# Patient Record
Sex: Female | Born: 1935 | Race: White | Hispanic: No | Smoking: Former smoker
Health system: Southern US, Community
[De-identification: ages and names within clinical notes are randomized; demographics above are authoritative.]

## PROBLEM LIST (undated history)

## (undated) DIAGNOSIS — E785 Hyperlipidemia, unspecified: Secondary | ICD-10-CM

## (undated) DIAGNOSIS — G4700 Insomnia, unspecified: Secondary | ICD-10-CM

## (undated) DIAGNOSIS — I10 Essential (primary) hypertension: Secondary | ICD-10-CM

## (undated) DIAGNOSIS — I509 Heart failure, unspecified: Secondary | ICD-10-CM

## (undated) DIAGNOSIS — G250 Essential tremor: Secondary | ICD-10-CM

## (undated) DIAGNOSIS — F0150 Vascular dementia without behavioral disturbance: Secondary | ICD-10-CM

## (undated) DIAGNOSIS — D649 Anemia, unspecified: Secondary | ICD-10-CM

## (undated) DIAGNOSIS — E079 Disorder of thyroid, unspecified: Secondary | ICD-10-CM

## (undated) DIAGNOSIS — IMO0001 Reserved for inherently not codable concepts without codable children: Secondary | ICD-10-CM

## (undated) DIAGNOSIS — F329 Major depressive disorder, single episode, unspecified: Secondary | ICD-10-CM

## (undated) DIAGNOSIS — F209 Schizophrenia, unspecified: Secondary | ICD-10-CM

## (undated) DIAGNOSIS — F32A Depression, unspecified: Secondary | ICD-10-CM

## (undated) DIAGNOSIS — G252 Other specified forms of tremor: Secondary | ICD-10-CM

## (undated) HISTORY — DX: Anemia, unspecified: D64.9

## (undated) HISTORY — DX: Essential tremor: G25.0

## (undated) HISTORY — DX: Other specified forms of tremor: G25.2

## (undated) HISTORY — DX: Essential (primary) hypertension: I10

## (undated) HISTORY — DX: Insomnia, unspecified: G47.00

## (undated) HISTORY — DX: Hyperlipidemia, unspecified: E78.5

## (undated) HISTORY — DX: Depression, unspecified: F32.A

## (undated) HISTORY — DX: Heart failure, unspecified: I50.9

## (undated) HISTORY — DX: Major depressive disorder, single episode, unspecified: F32.9

## (undated) HISTORY — DX: Schizophrenia, unspecified: F20.9

## (undated) HISTORY — DX: Disorder of thyroid, unspecified: E07.9

## (undated) HISTORY — DX: Reserved for inherently not codable concepts without codable children: IMO0001

---

## 1998-06-15 ENCOUNTER — Inpatient Hospital Stay (HOSPITAL_COMMUNITY): Admission: EM | Admit: 1998-06-15 | Discharge: 1998-07-05 | Payer: Self-pay | Admitting: Emergency Medicine

## 2000-04-27 ENCOUNTER — Encounter (INDEPENDENT_AMBULATORY_CARE_PROVIDER_SITE_OTHER): Payer: Self-pay | Admitting: *Deleted

## 2000-04-27 ENCOUNTER — Ambulatory Visit (HOSPITAL_BASED_OUTPATIENT_CLINIC_OR_DEPARTMENT_OTHER): Admission: RE | Admit: 2000-04-27 | Discharge: 2000-04-27 | Payer: Self-pay | Admitting: General Surgery

## 2000-05-25 ENCOUNTER — Encounter: Payer: Self-pay | Admitting: *Deleted

## 2000-05-25 ENCOUNTER — Ambulatory Visit (HOSPITAL_COMMUNITY): Admission: RE | Admit: 2000-05-25 | Discharge: 2000-05-25 | Payer: Self-pay | Admitting: *Deleted

## 2000-06-29 ENCOUNTER — Other Ambulatory Visit: Admission: RE | Admit: 2000-06-29 | Discharge: 2000-06-29 | Payer: Self-pay | Admitting: *Deleted

## 2001-06-02 ENCOUNTER — Encounter: Payer: Self-pay | Admitting: *Deleted

## 2001-06-02 ENCOUNTER — Inpatient Hospital Stay (HOSPITAL_COMMUNITY): Admission: EM | Admit: 2001-06-02 | Discharge: 2001-06-06 | Payer: Self-pay | Admitting: *Deleted

## 2001-06-04 ENCOUNTER — Encounter: Payer: Self-pay | Admitting: Internal Medicine

## 2003-01-29 ENCOUNTER — Encounter: Admission: RE | Admit: 2003-01-29 | Discharge: 2003-01-29 | Payer: Self-pay | Admitting: Gastroenterology

## 2003-04-04 ENCOUNTER — Ambulatory Visit: Admission: RE | Admit: 2003-04-04 | Discharge: 2003-04-04 | Payer: Self-pay | Admitting: Gastroenterology

## 2003-05-02 ENCOUNTER — Ambulatory Visit (HOSPITAL_COMMUNITY): Admission: RE | Admit: 2003-05-02 | Discharge: 2003-05-02 | Payer: Self-pay | Admitting: Internal Medicine

## 2003-06-15 ENCOUNTER — Encounter: Admission: RE | Admit: 2003-06-15 | Discharge: 2003-06-15 | Payer: Self-pay | Admitting: Gastroenterology

## 2004-07-22 ENCOUNTER — Ambulatory Visit (HOSPITAL_COMMUNITY): Admission: RE | Admit: 2004-07-22 | Discharge: 2004-07-22 | Payer: Self-pay | Admitting: Internal Medicine

## 2004-07-30 ENCOUNTER — Ambulatory Visit: Payer: Self-pay | Admitting: Hematology & Oncology

## 2004-08-11 ENCOUNTER — Ambulatory Visit: Payer: Self-pay | Admitting: Hematology & Oncology

## 2005-03-24 ENCOUNTER — Ambulatory Visit (HOSPITAL_COMMUNITY): Admission: RE | Admit: 2005-03-24 | Discharge: 2005-03-24 | Payer: Self-pay | Admitting: Internal Medicine

## 2005-04-19 ENCOUNTER — Encounter: Admission: RE | Admit: 2005-04-19 | Discharge: 2005-04-19 | Payer: Self-pay | Admitting: Gastroenterology

## 2005-08-18 ENCOUNTER — Encounter: Admission: RE | Admit: 2005-08-18 | Discharge: 2005-08-18 | Payer: Self-pay | Admitting: Internal Medicine

## 2005-10-24 ENCOUNTER — Inpatient Hospital Stay (HOSPITAL_COMMUNITY): Admission: EM | Admit: 2005-10-24 | Discharge: 2005-10-29 | Payer: Self-pay | Admitting: Emergency Medicine

## 2006-02-21 ENCOUNTER — Emergency Department (HOSPITAL_COMMUNITY): Admission: EM | Admit: 2006-02-21 | Discharge: 2006-02-21 | Payer: Self-pay | Admitting: Family Medicine

## 2006-03-12 ENCOUNTER — Emergency Department (HOSPITAL_COMMUNITY): Admission: EM | Admit: 2006-03-12 | Discharge: 2006-03-12 | Payer: Self-pay | Admitting: Emergency Medicine

## 2006-03-19 ENCOUNTER — Ambulatory Visit: Payer: Self-pay | Admitting: Family Medicine

## 2006-03-19 ENCOUNTER — Encounter (INDEPENDENT_AMBULATORY_CARE_PROVIDER_SITE_OTHER): Payer: Self-pay | Admitting: Family Medicine

## 2006-03-19 LAB — CONVERTED CEMR LAB
AST: 26 units/L (ref 0–37)
Albumin: 4.1 g/dL (ref 3.5–5.2)
BUN: 12 mg/dL (ref 6–23)
CO2: 27 meq/L (ref 19–32)
Creatinine, Ser: 1.08 mg/dL (ref 0.40–1.20)
Glucose, Bld: 88 mg/dL (ref 70–99)
HDL: 60 mg/dL (ref >39)
Potassium: 3.5 meq/L (ref 3.5–5.3)
Sodium: 140 meq/L (ref 135–145)
Total Bilirubin: 0.9 mg/dL (ref 0.3–1.2)
Total Protein: 6.9 g/dL (ref 6.0–8.3)
VLDL: 39 mg/dL (ref 0–40)

## 2006-04-14 ENCOUNTER — Emergency Department (HOSPITAL_COMMUNITY): Admission: EM | Admit: 2006-04-14 | Discharge: 2006-04-14 | Payer: Self-pay | Admitting: *Deleted

## 2006-04-29 DIAGNOSIS — E039 Hypothyroidism, unspecified: Secondary | ICD-10-CM | POA: Insufficient documentation

## 2006-04-29 DIAGNOSIS — F209 Schizophrenia, unspecified: Secondary | ICD-10-CM

## 2006-04-29 DIAGNOSIS — K5900 Constipation, unspecified: Secondary | ICD-10-CM | POA: Insufficient documentation

## 2006-04-29 HISTORY — DX: Schizophrenia, unspecified: F20.9

## 2006-05-28 ENCOUNTER — Ambulatory Visit: Payer: Self-pay | Admitting: Sports Medicine

## 2006-05-28 ENCOUNTER — Encounter (INDEPENDENT_AMBULATORY_CARE_PROVIDER_SITE_OTHER): Payer: Self-pay | Admitting: Family Medicine

## 2006-05-28 DIAGNOSIS — I10 Essential (primary) hypertension: Secondary | ICD-10-CM

## 2006-05-28 DIAGNOSIS — G250 Essential tremor: Secondary | ICD-10-CM | POA: Insufficient documentation

## 2006-05-28 DIAGNOSIS — G252 Other specified forms of tremor: Secondary | ICD-10-CM

## 2006-05-28 DIAGNOSIS — E785 Hyperlipidemia, unspecified: Secondary | ICD-10-CM | POA: Insufficient documentation

## 2006-05-28 HISTORY — DX: Essential tremor: G25.2

## 2006-05-28 HISTORY — DX: Essential tremor: G25.0

## 2006-07-09 ENCOUNTER — Encounter (INDEPENDENT_AMBULATORY_CARE_PROVIDER_SITE_OTHER): Payer: Self-pay | Admitting: Family Medicine

## 2006-07-09 ENCOUNTER — Ambulatory Visit: Payer: Self-pay | Admitting: Family Medicine

## 2006-07-28 LAB — CONVERTED CEMR LAB
ALT: 12 units/L (ref 0–35)
Albumin: 3.9 g/dL (ref 3.5–5.2)
Alkaline Phosphatase: 115 units/L (ref 39–117)
CO2: 30 meq/L (ref 19–32)
Calcium: 9.3 mg/dL (ref 8.4–10.5)
Chloride: 108 meq/L (ref 96–112)
Glucose, Bld: 97 mg/dL (ref 70–99)
Potassium: 4.5 meq/L (ref 3.5–5.3)
Sodium: 144 meq/L (ref 135–145)
TSH: 0.06 microintl units/mL — ABNORMAL LOW (ref 0.350–5.50)
Total Protein: 6.1 g/dL (ref 6.0–8.3)

## 2006-08-18 ENCOUNTER — Encounter (INDEPENDENT_AMBULATORY_CARE_PROVIDER_SITE_OTHER): Payer: Self-pay | Admitting: Family Medicine

## 2006-08-18 ENCOUNTER — Ambulatory Visit: Payer: Self-pay | Admitting: Family Medicine

## 2006-09-15 ENCOUNTER — Ambulatory Visit: Payer: Self-pay | Admitting: Family Medicine

## 2006-09-23 ENCOUNTER — Ambulatory Visit: Payer: Self-pay | Admitting: Family Medicine

## 2006-09-23 ENCOUNTER — Encounter: Admission: RE | Admit: 2006-09-23 | Discharge: 2006-09-23 | Payer: Self-pay | Admitting: Sports Medicine

## 2006-09-27 ENCOUNTER — Encounter: Payer: Self-pay | Admitting: Family Medicine

## 2006-11-17 ENCOUNTER — Telehealth (INDEPENDENT_AMBULATORY_CARE_PROVIDER_SITE_OTHER): Payer: Self-pay | Admitting: *Deleted

## 2006-11-17 ENCOUNTER — Ambulatory Visit: Payer: Self-pay | Admitting: Family Medicine

## 2006-11-17 DIAGNOSIS — R609 Edema, unspecified: Secondary | ICD-10-CM

## 2007-01-18 ENCOUNTER — Telehealth: Payer: Self-pay | Admitting: Family Medicine

## 2007-01-18 ENCOUNTER — Ambulatory Visit: Payer: Self-pay | Admitting: Family Medicine

## 2007-01-18 ENCOUNTER — Encounter: Payer: Self-pay | Admitting: Family Medicine

## 2007-01-20 ENCOUNTER — Encounter: Payer: Self-pay | Admitting: Family Medicine

## 2007-01-20 LAB — CONVERTED CEMR LAB
ALT: 8 units/L
AST: 22 units/L
Alkaline Phosphatase: 122 units/L
Direct LDL: 75 mg/dL
TSH: 69.559 microintl units/mL

## 2007-01-21 LAB — CONVERTED CEMR LAB
Albumin: 3.8 g/dL (ref 3.5–5.2)
Direct LDL: 75 mg/dL
Potassium: 3.7 meq/L (ref 3.5–5.3)
TSH: 69.559 microintl units/mL — ABNORMAL HIGH (ref 0.350–5.50)
Total Bilirubin: 0.8 mg/dL (ref 0.3–1.2)
Total Protein: 6.4 g/dL (ref 6.0–8.3)

## 2007-01-24 ENCOUNTER — Telehealth: Payer: Self-pay | Admitting: Family Medicine

## 2007-02-17 ENCOUNTER — Ambulatory Visit: Payer: Self-pay | Admitting: Family Medicine

## 2007-02-17 ENCOUNTER — Encounter: Payer: Self-pay | Admitting: Family Medicine

## 2007-02-17 DIAGNOSIS — R5381 Other malaise: Secondary | ICD-10-CM

## 2007-02-17 DIAGNOSIS — R5383 Other fatigue: Secondary | ICD-10-CM

## 2007-02-17 LAB — CONVERTED CEMR LAB
BUN: 25 mg/dL — ABNORMAL HIGH (ref 6–23)
Calcium: 9.8 mg/dL (ref 8.4–10.5)
Glucose, Bld: 90 mg/dL (ref 70–99)
HCT: 36.7 % (ref 36.0–46.0)
MCHC: 33.8 g/dL (ref 30.0–36.0)
Platelets: 162 10*3/uL (ref 150–400)
Potassium: 3.9 meq/L (ref 3.5–5.3)
RDW: 13.6 % (ref 11.5–15.5)
Sodium: 141 meq/L (ref 135–145)
TSH: 14.444 microintl units/mL — ABNORMAL HIGH (ref 0.350–5.50)
Total CK: 27 units/L (ref 7–177)

## 2007-02-18 ENCOUNTER — Telehealth: Payer: Self-pay | Admitting: Family Medicine

## 2007-02-28 ENCOUNTER — Telehealth: Payer: Self-pay | Admitting: Family Medicine

## 2007-03-07 ENCOUNTER — Telehealth: Payer: Self-pay | Admitting: *Deleted

## 2007-03-08 ENCOUNTER — Emergency Department (HOSPITAL_COMMUNITY): Admission: EM | Admit: 2007-03-08 | Discharge: 2007-03-09 | Payer: Self-pay | Admitting: Emergency Medicine

## 2007-03-09 ENCOUNTER — Telehealth: Payer: Self-pay | Admitting: *Deleted

## 2007-03-10 ENCOUNTER — Ambulatory Visit: Payer: Self-pay | Admitting: Family Medicine

## 2007-04-07 ENCOUNTER — Telehealth: Payer: Self-pay | Admitting: Family Medicine

## 2007-04-21 ENCOUNTER — Encounter: Payer: Self-pay | Admitting: Family Medicine

## 2007-04-21 DIAGNOSIS — Z789 Other specified health status: Secondary | ICD-10-CM | POA: Insufficient documentation

## 2007-07-12 ENCOUNTER — Telehealth: Payer: Self-pay | Admitting: *Deleted

## 2007-07-13 ENCOUNTER — Encounter (INDEPENDENT_AMBULATORY_CARE_PROVIDER_SITE_OTHER): Payer: Self-pay | Admitting: Family Medicine

## 2007-07-13 ENCOUNTER — Ambulatory Visit: Payer: Self-pay | Admitting: Family Medicine

## 2007-07-13 DIAGNOSIS — IMO0001 Reserved for inherently not codable concepts without codable children: Secondary | ICD-10-CM

## 2007-07-13 HISTORY — DX: Reserved for inherently not codable concepts without codable children: IMO0001

## 2007-07-13 LAB — CONVERTED CEMR LAB
ALT: 10 units/L (ref 0–35)
AST: 20 units/L (ref 0–37)
Albumin: 4.1 g/dL (ref 3.5–5.2)
Alkaline Phosphatase: 118 units/L — ABNORMAL HIGH (ref 39–117)
BUN: 20 mg/dL (ref 6–23)
Basophils Absolute: 0 10*3/uL (ref 0.0–0.1)
Basophils Relative: 1 % (ref 0–1)
Calcium: 9.6 mg/dL (ref 8.4–10.5)
Chloride: 102 meq/L (ref 96–112)
Hemoglobin: 12.9 g/dL (ref 12.0–15.0)
Lymphocytes Relative: 17 % (ref 12–46)
MCHC: 33.2 g/dL (ref 30.0–36.0)
Monocytes Absolute: 0.4 10*3/uL (ref 0.1–1.0)
Neutro Abs: 4.4 10*3/uL (ref 1.7–7.7)
Neutrophils Relative %: 71 % (ref 43–77)
Platelets: 186 10*3/uL (ref 150–400)
Potassium: 4.1 meq/L (ref 3.5–5.3)
RDW: 13.1 % (ref 11.5–15.5)
Sed Rate: 36 mm/hr — ABNORMAL HIGH (ref 0–22)
Sodium: 140 meq/L (ref 135–145)
TSH: 3.525 microintl units/mL (ref 0.350–5.50)
Total Protein: 6.6 g/dL (ref 6.0–8.3)

## 2007-07-15 ENCOUNTER — Encounter (INDEPENDENT_AMBULATORY_CARE_PROVIDER_SITE_OTHER): Payer: Self-pay | Admitting: Family Medicine

## 2007-07-18 ENCOUNTER — Encounter (INDEPENDENT_AMBULATORY_CARE_PROVIDER_SITE_OTHER): Payer: Self-pay | Admitting: Family Medicine

## 2007-07-28 ENCOUNTER — Ambulatory Visit: Payer: Self-pay | Admitting: Family Medicine

## 2007-07-28 ENCOUNTER — Telehealth (INDEPENDENT_AMBULATORY_CARE_PROVIDER_SITE_OTHER): Payer: Self-pay | Admitting: *Deleted

## 2007-07-28 DIAGNOSIS — R413 Other amnesia: Secondary | ICD-10-CM | POA: Insufficient documentation

## 2007-07-29 ENCOUNTER — Encounter: Payer: Self-pay | Admitting: Family Medicine

## 2007-07-29 ENCOUNTER — Telehealth (INDEPENDENT_AMBULATORY_CARE_PROVIDER_SITE_OTHER): Payer: Self-pay | Admitting: *Deleted

## 2007-08-19 ENCOUNTER — Telehealth: Payer: Self-pay | Admitting: Family Medicine

## 2007-11-01 ENCOUNTER — Emergency Department (HOSPITAL_COMMUNITY): Admission: EM | Admit: 2007-11-01 | Discharge: 2007-11-01 | Payer: Self-pay | Admitting: Emergency Medicine

## 2007-11-15 ENCOUNTER — Telehealth: Payer: Self-pay | Admitting: *Deleted

## 2007-11-17 ENCOUNTER — Ambulatory Visit: Payer: Self-pay | Admitting: Family Medicine

## 2007-11-17 ENCOUNTER — Encounter: Payer: Self-pay | Admitting: Family Medicine

## 2007-11-17 LAB — CONVERTED CEMR LAB
BUN: 19 mg/dL (ref 6–23)
Potassium: 4.2 meq/L (ref 3.5–5.3)
Sodium: 143 meq/L (ref 135–145)

## 2008-01-11 ENCOUNTER — Ambulatory Visit: Payer: Self-pay | Admitting: Family Medicine

## 2008-03-29 IMAGING — CR DG RIBS W/ CHEST 3+V*L*
5 series · 5 of 5 positions shown · non-contrast
Comparison: none

CLINICAL DATA: Rib pain after a fall. 
 LEFT RIBS WITH CHEST:
 A frontal chest x-ray and three left rib detail films were obtained.  On chest x-ray, the lungs are clear.  No pneumothorax is seen.  There are degenerative changes noted in the shoulders, left greater than right.  Heart size is within normal limits.  No definite acute rib fracture is seen.

[view not recorded (1 of 5)]
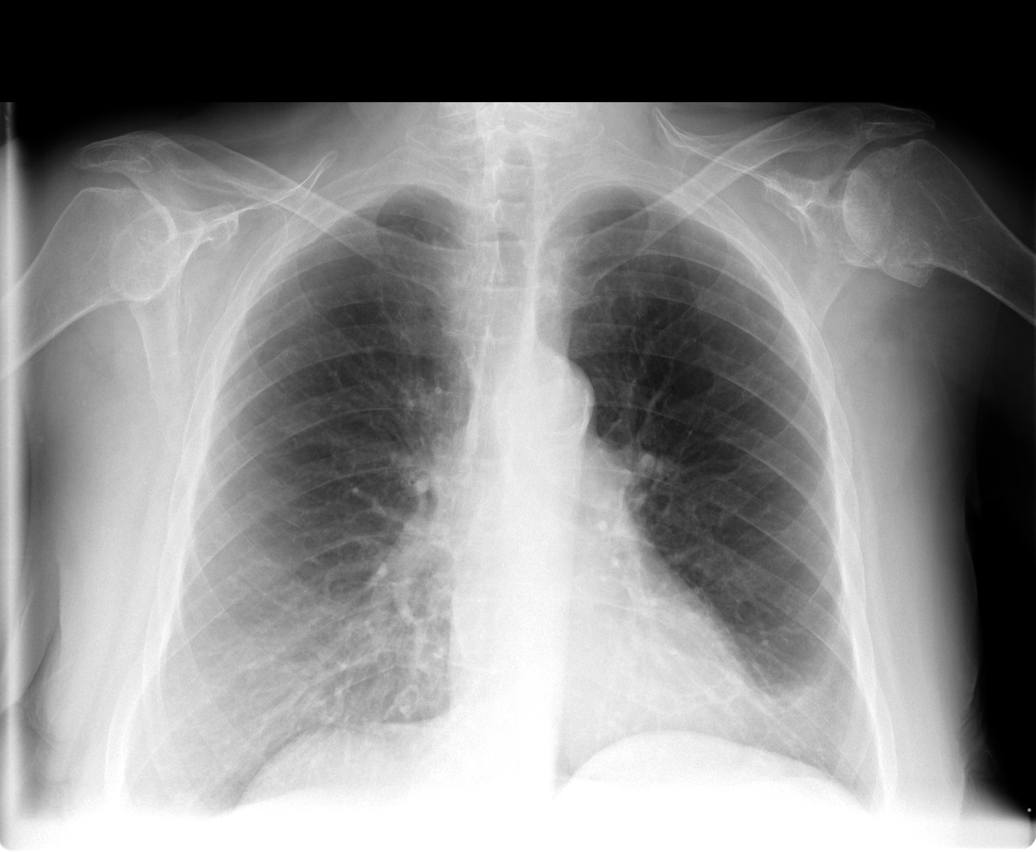

[view not recorded (2 of 5)]
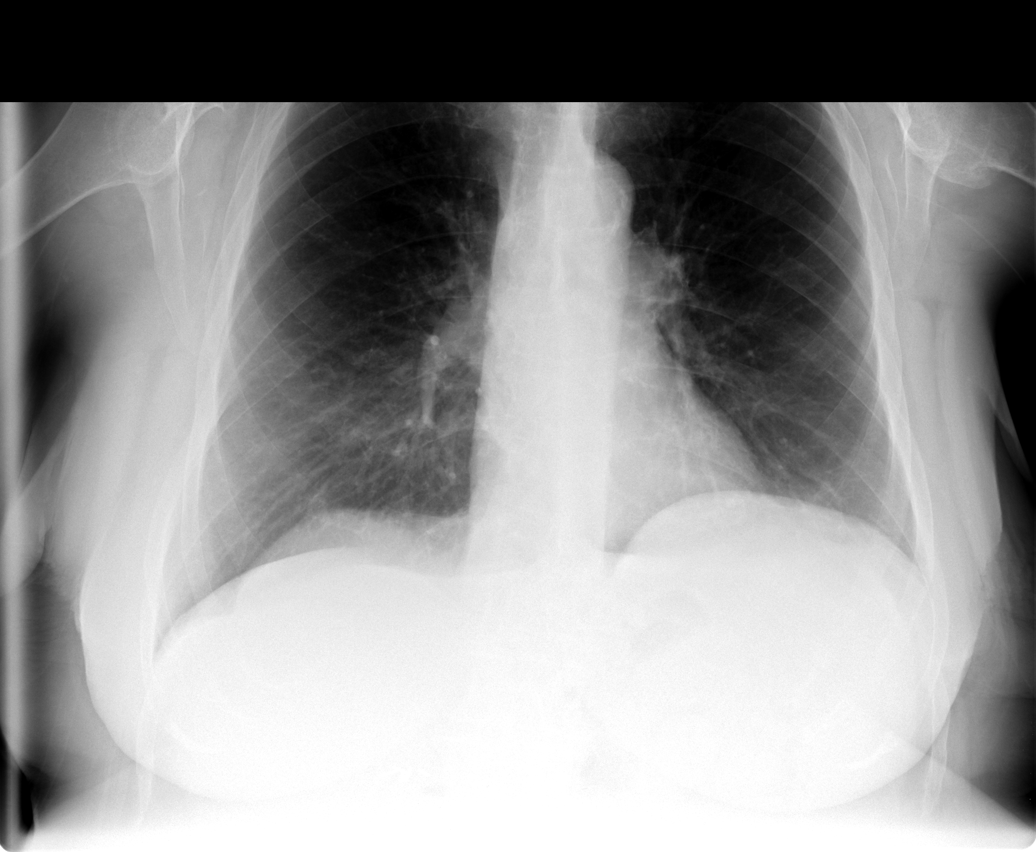

[view not recorded (3 of 5)]
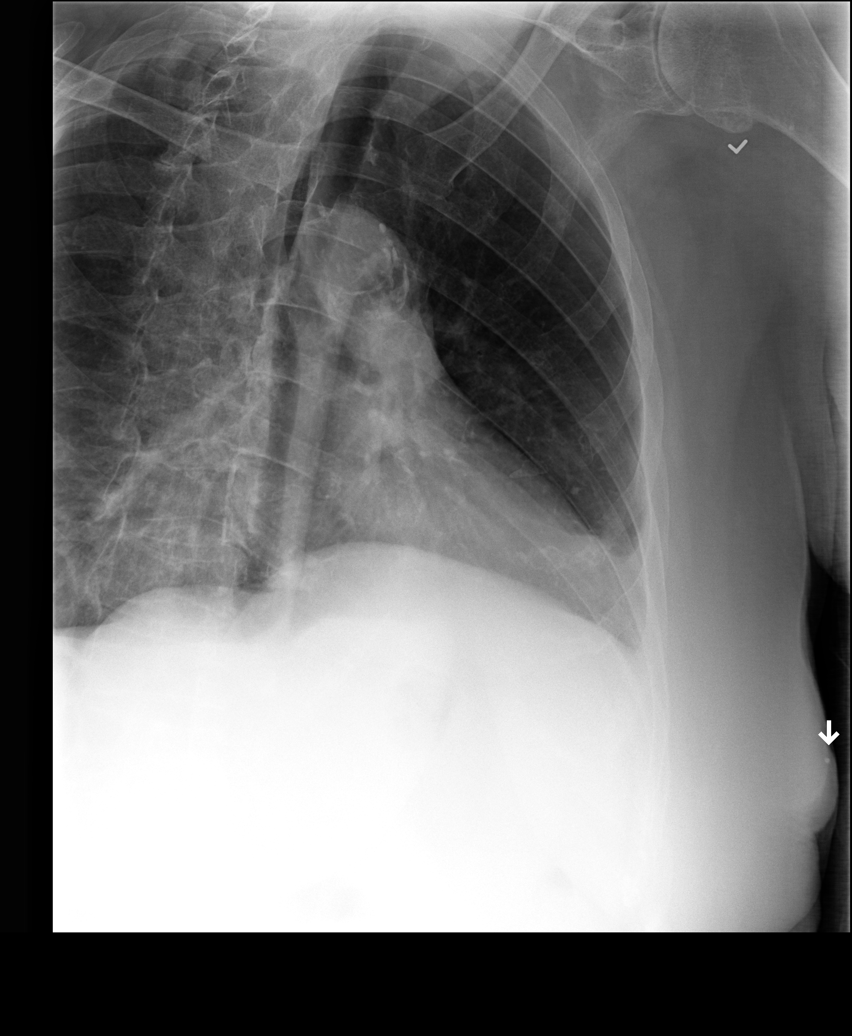

[view not recorded (4 of 5)]
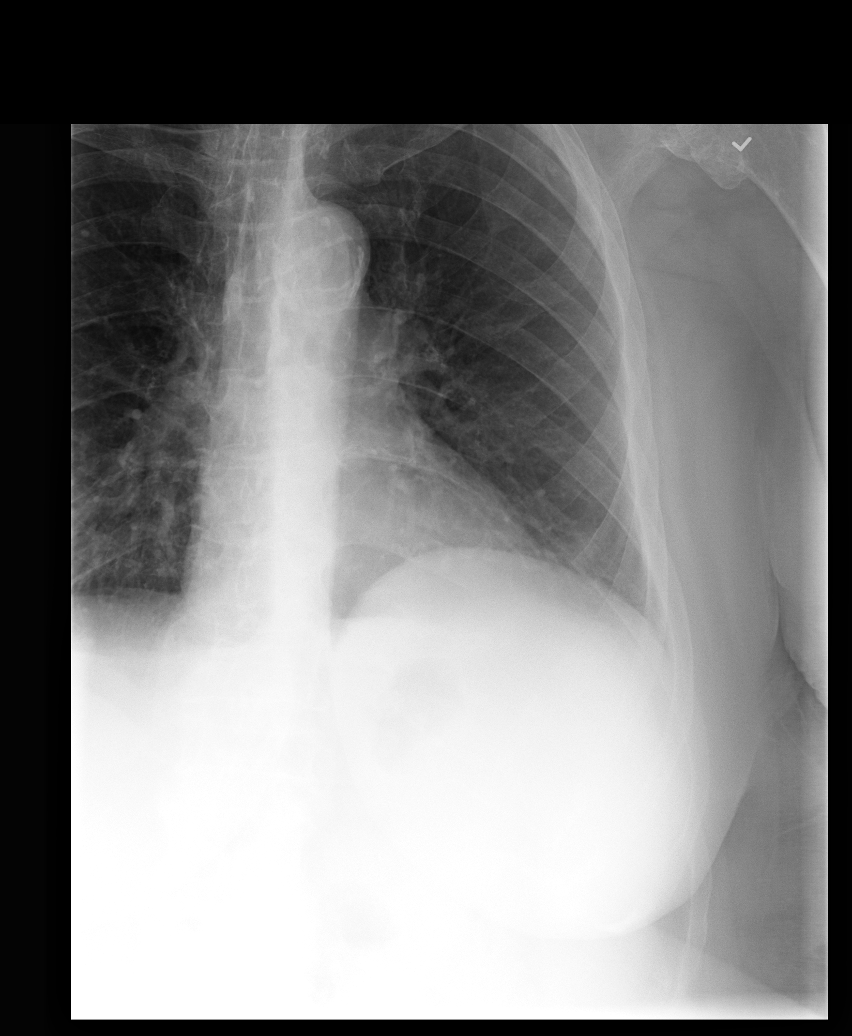

[view not recorded (5 of 5)]
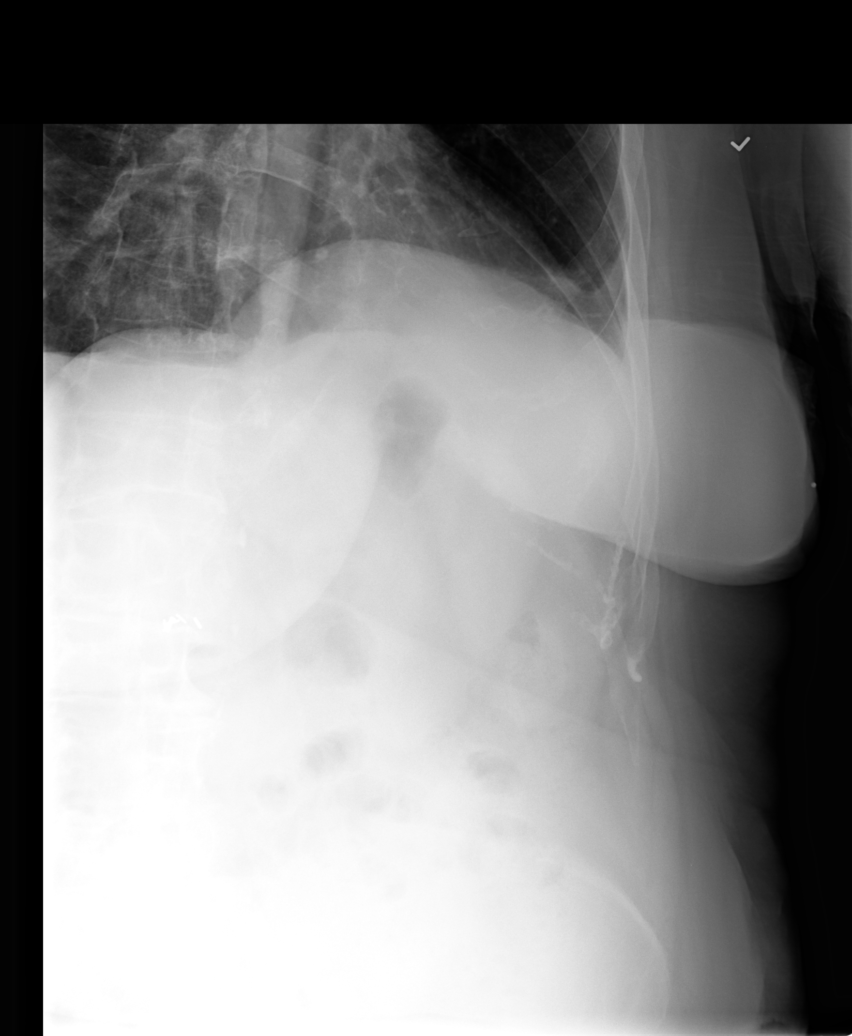

[5 of 5 positions shown; findings below may reference images not displayed]

IMPRESSION: 1.  No active lung disease. 
 2.  No definite left rib fracture is noted.

## 2008-05-18 ENCOUNTER — Telehealth: Payer: Self-pay | Admitting: *Deleted

## 2008-05-22 ENCOUNTER — Encounter: Payer: Self-pay | Admitting: Family Medicine

## 2008-05-22 ENCOUNTER — Telehealth: Payer: Self-pay | Admitting: Family Medicine

## 2008-05-22 ENCOUNTER — Ambulatory Visit: Payer: Self-pay | Admitting: Family Medicine

## 2008-05-22 DIAGNOSIS — R1110 Vomiting, unspecified: Secondary | ICD-10-CM | POA: Insufficient documentation

## 2008-05-22 LAB — CONVERTED CEMR LAB
Bilirubin Urine: NEGATIVE
Blood in Urine, dipstick: NEGATIVE
Glucose, Urine, Semiquant: NEGATIVE
Protein, U semiquant: NEGATIVE
Specific Gravity, Urine: 1.01
pH: 6

## 2008-05-24 ENCOUNTER — Encounter: Payer: Self-pay | Admitting: Family Medicine

## 2008-05-24 LAB — CONVERTED CEMR LAB
ALT: 10 units/L (ref 0–35)
AST: 26 units/L (ref 0–37)
CO2: 30 meq/L (ref 19–32)
Cholesterol: 187 mg/dL (ref 0–200)
LDL Cholesterol: 104 mg/dL — ABNORMAL HIGH (ref 0–99)
Sodium: 140 meq/L (ref 135–145)
TSH: 44.918 microintl units/mL — ABNORMAL HIGH (ref 0.350–4.500)
Total Bilirubin: 1.1 mg/dL (ref 0.3–1.2)
Total CK: 24 units/L (ref 7–177)
Total Protein: 6.5 g/dL (ref 6.0–8.3)
VLDL: 23 mg/dL (ref 0–40)

## 2008-05-28 ENCOUNTER — Telehealth: Payer: Self-pay | Admitting: *Deleted

## 2008-06-20 ENCOUNTER — Telehealth: Payer: Self-pay | Admitting: *Deleted

## 2008-07-02 ENCOUNTER — Telehealth: Payer: Self-pay | Admitting: *Deleted

## 2008-07-19 ENCOUNTER — Telehealth: Payer: Self-pay | Admitting: Family Medicine

## 2008-08-06 ENCOUNTER — Encounter: Payer: Self-pay | Admitting: Family Medicine

## 2008-08-28 ENCOUNTER — Telehealth (INDEPENDENT_AMBULATORY_CARE_PROVIDER_SITE_OTHER): Payer: Self-pay | Admitting: Family Medicine

## 2008-08-29 ENCOUNTER — Telehealth: Payer: Self-pay | Admitting: Family Medicine

## 2008-08-31 ENCOUNTER — Emergency Department (HOSPITAL_COMMUNITY): Admission: EM | Admit: 2008-08-31 | Discharge: 2008-08-31 | Payer: Self-pay | Admitting: Emergency Medicine

## 2008-08-31 ENCOUNTER — Telehealth: Payer: Self-pay | Admitting: Family Medicine

## 2008-10-25 ENCOUNTER — Ambulatory Visit: Payer: Self-pay | Admitting: Family Medicine

## 2008-10-25 ENCOUNTER — Encounter: Payer: Self-pay | Admitting: Family Medicine

## 2008-10-25 DIAGNOSIS — K5900 Constipation, unspecified: Secondary | ICD-10-CM | POA: Insufficient documentation

## 2008-10-25 DIAGNOSIS — R198 Other specified symptoms and signs involving the digestive system and abdomen: Secondary | ICD-10-CM | POA: Insufficient documentation

## 2008-10-25 DIAGNOSIS — Z789 Other specified health status: Secondary | ICD-10-CM | POA: Insufficient documentation

## 2008-10-25 DIAGNOSIS — R634 Abnormal weight loss: Secondary | ICD-10-CM

## 2008-10-26 ENCOUNTER — Telehealth: Payer: Self-pay | Admitting: *Deleted

## 2008-10-26 LAB — CONVERTED CEMR LAB
ALT: <8 units/L (ref 0–35)
AST: 16 units/L (ref 0–37)
CO2: 23 meq/L (ref 19–32)
Calcium: 9.2 mg/dL (ref 8.4–10.5)
Chloride: 106 meq/L (ref 96–112)
HCT: 40 % (ref 36.0–46.0)
Platelets: 140 10*3/uL — ABNORMAL LOW (ref 150–400)
Potassium: 3.5 meq/L (ref 3.5–5.3)
RDW: 13.5 % (ref 11.5–15.5)
Sodium: 142 meq/L (ref 135–145)
Total Protein: 6 g/dL (ref 6.0–8.3)
WBC: 5.2 10*3/uL (ref 4.0–10.5)

## 2009-02-27 ENCOUNTER — Telehealth: Payer: Self-pay | Admitting: Family Medicine

## 2009-03-01 ENCOUNTER — Encounter: Payer: Self-pay | Admitting: Emergency Medicine

## 2009-03-01 ENCOUNTER — Telehealth: Payer: Self-pay | Admitting: Family Medicine

## 2009-03-02 ENCOUNTER — Inpatient Hospital Stay (HOSPITAL_COMMUNITY): Admission: EM | Admit: 2009-03-02 | Discharge: 2009-03-06 | Payer: Self-pay | Admitting: Family Medicine

## 2009-03-02 ENCOUNTER — Ambulatory Visit: Payer: Self-pay | Admitting: Family Medicine

## 2009-03-02 ENCOUNTER — Encounter: Payer: Self-pay | Admitting: Family Medicine

## 2009-03-03 ENCOUNTER — Encounter: Payer: Self-pay | Admitting: Family Medicine

## 2009-05-09 ENCOUNTER — Encounter: Admission: RE | Admit: 2009-05-09 | Discharge: 2009-05-09 | Payer: Self-pay | Admitting: Neurology

## 2010-04-01 NOTE — Initial Assessments (Signed)
Summary: Hospital Admit    Past Medical History:    Reviewed history from 10/25/2008 and no changes required:       skin `cancer` on chest wall       schizophrenia       hypothyroidism              Per [facility] notes:       COPD       HTN       Diverticulitis       h/o pancreatic head mass by CT (?8mm)        Colonoscopy 2006 with polyps       ?Cirrhosis from alcohol abuse       GERD       Osteoporosis DEXA 07/17/04 - Spine T scpre -1.6, Femur scores -2.7, -2.7       Gait difficulties       hypokalemia       h/o malignant melanoma on neck 2005 with no further treatment       hepatosplenomegaly       pedal edema       atrophic vaginitis       ?memory problems   Primary Care Provider:  Ancil Boozer  MD  CC:  Constipation and weakness.  History of Present Illness: 77 y.o. female presents with constipation x 1 week and weakness for months. Most of information taken from telephone interview with patient's daughter. For months patient has not been walking after a fall in March and health haS deteriorated. with decreased appetite and unintentional weight loss of at least 30-40lbs. Bowel regimine was started by PCP per notes but patient is currently not on such treatment.  For past few weeks patient has complained of abdominal pain and recently unable to tolerate any foods secondary to emesis NB/NB. Note pt states her abdomen is sore but can not give any specifics. Pt evalated by [facility] and KUB showed impaction. Fleet enema given with minimal results.      Review of Systems       The patient complains of anorexia, weight loss, abdominal pain, muscle weakness, and difficulty walking.  The patient denies fever, chest pain, dyspnea on exertion, headaches, and hematochezia.     Physical Exam  General:  Chronically ill appearing, elderly female in NAD Head:  NCAT Eyes:  Perrl, non-icteric, EOMI  Mouth:  dentures in place.  pink mucous, slightly dry tongue  Neck:  supple  and full ROM.   Lungs:  bibasilar crackles Heart:  rrr, no murmur Abdomen:  +BS, soft, mild tenderness in lower quandrants, no rebound, no gaurding, scar tissue felt in midline, non-distended Rectal:  Soft stool in rectum. skin tags, irritation surround rectum, no gross blood on exam  Msk:  strength 4/5 bilateral lower extremities.  unable to walk.  mild erythema in left ankle Pulses:  unable to appreciate but feet are warm and well perfused.   Extremities:  2+ edema bilateral lower extremities.  Neurologic:  Early dementia No apparent hallucinations alert & oriented X3 and cranial nerves II-XII intact.  Sensation in tact Gait not tested    Family History: Reviewed history from 10/25/2008 and no changes required. daugher with fibromyalgia, chronic kidney disease daughter with crohn's disease fhx: gout, heart failure, COPD, crohns, cancers  (ovarian, cervical, skin)  Social History: Reviewed history from 10/25/2008 and no changes required. Dips snuff regularly  has 6 cats and 1 dog.  lives with daughter Britta Mccreedy.   granddaughter and great  grandson are close by.   finished 4th or 5th grade education.  remote history of very alcohol use.  receives meals on wheels   Current Medications (verified): 1)  Synthroid 112 Mcg  Tabs (Levothyroxine Sodium) .... Take 1 Tab By Mouth Daily 2)  Haloperidol 0.5 Mg  Tabs (Haloperidol) .... Take 1 Tab By Mouth Twice Daily 3)  Ultram 50 Mg  Tabs (Tramadol Hcl) .... 2 Tabs Three Times A Day As Needed Pain. 4)  Miralax  Powd (Polyethylene Glycol 3350) .Marland KitchenMarland KitchenMarland Kitchen 17 G By Mouth Once Daily For Constipation.  Disp 1 Month Supply  Allergies: No Known Drug Allergies     Na 144 Kt 3.7  Cl 107   CO2 30 Glu 93 BUN 17  Cr 0.74  Ca 9.2 KUB- Moderate amount of stool in the distal sigmoid/rectum, non obstructive bowel gas patter. Osteopenia with suggestion of compression deformities of lower thoracic/ Lumbar vertebral bodies  Impression &  Recommendations:  Problem # 1:  CONSTIPATION (ICD-564.00)  Pt with fecal impaction per KUB and history. Plan to disimpact using Milk of Molasses enema. Then restart bowel regimine with Miralax  Her updated medication list for this problem includes:    Miralax Powd (Polyethylene glycol 3350) .Marland KitchenMarland KitchenMarland KitchenMarland Kitchen 17 g by mouth once daily for constipation.  disp 1 month supply  Problem # 2:  Failure to Thrive Pt with evidence of FTT with anorexia and weight loss, progressive weakness and dementia. Will attempt to increase appetite with ensure and nutrition consult. Consider megace. Cancer work-up thus far history of melanoma and negative colonscopy  Problem # 3:  VOMITING (ICD-787.03) Likely secondary to Constipation and impaction. No evidence of infection on exam. Anti-emetics as needed  Problem # 4:  SCHIZOPHRENIA (ICD-295.90) Continue Haldol  Problem # 5:  HYPOTHYROIDISM, UNSPECIFIED (ICD-244.9) Continue Synthroid Her updated medication list for this problem includes:    Synthroid 112 Mcg Tabs (Levothyroxine sodium) .Marland Kitchen... Take 1 tab by mouth daily  Problem # 6:  HYPERTENSION, BENIGN ESSENTIAL (ICD-401.1) Stable off meds will monitor  Problem # 7:  FEN/GI Electrolytes wnl. Will monitor . Will liberalize diet . check Pre-albumin  Problem # 8:  Prophylaxis PPI, Heparin TID  Problem # 9:  Dispo Full Code Pending improvement, will have SNF evaluation as family unsure if they will be able to care for her any longer  Complete Medication List: 1)  Synthroid 112 Mcg Tabs (Levothyroxine sodium) .... Take 1 tab by mouth daily 2)  Haloperidol 0.5 Mg Tabs (Haloperidol) .... Take 1 tab by mouth twice daily 3)  Ultram 50 Mg Tabs (Tramadol hcl) .... 2 tabs three times a day as needed pain. 4)  Miralax Powd (Polyethylene glycol 3350) .Marland KitchenMarland KitchenMarland Kitchen 17 g by mouth once daily for constipation.  disp 1 month supply

## 2010-05-17 LAB — BASIC METABOLIC PANEL
BUN: 14 mg/dL (ref 6–23)
BUN: 17 mg/dL (ref 6–23)
Calcium: 8.7 mg/dL (ref 8.4–10.5)
Calcium: 9.3 mg/dL (ref 8.4–10.5)
Calcium: 9.5 mg/dL (ref 8.4–10.5)
Chloride: 107 mEq/L (ref 96–112)
Creatinine, Ser: 0.55 mg/dL (ref 0.4–1.2)
GFR calc Af Amer: >60 mL/min (ref >60)
GFR calc non Af Amer: >60 mL/min (ref >60)
GFR calc non Af Amer: >60 mL/min (ref >60)
GFR calc non Af Amer: >60 mL/min (ref >60)
Potassium: 3.8 mEq/L (ref 3.5–5.1)
Potassium: 3.8 mEq/L (ref 3.5–5.1)
Sodium: 140 mEq/L (ref 135–145)
Sodium: 144 mEq/L (ref 135–145)

## 2010-05-17 LAB — VITAMIN B12: Vitamin B-12: 231 pg/mL (ref 211–911)

## 2010-05-17 LAB — CBC
HCT: 31.7 % — ABNORMAL LOW (ref 36.0–46.0)
Hemoglobin: 11 g/dL — ABNORMAL LOW (ref 12.0–15.0)
MCHC: 35 g/dL (ref 30.0–36.0)
Platelets: 105 10*3/uL — ABNORMAL LOW (ref 150–400)
Platelets: 134 10*3/uL — ABNORMAL LOW (ref 150–400)
RDW: 12.9 % (ref 11.5–15.5)
RDW: 13 % (ref 11.5–15.5)
WBC: 5.9 10*3/uL (ref 4.0–10.5)

## 2010-05-17 LAB — RETICULOCYTES: Retic Ct Pct: 1.1 % (ref 0.4–3.1)

## 2010-05-17 LAB — TSH
TSH: 0.823 u[IU]/mL (ref 0.350–4.500)
TSH: 1.075 u[IU]/mL (ref 0.350–4.500)

## 2010-05-17 LAB — PREALBUMIN
Prealbumin: 7.6 mg/dL — ABNORMAL LOW (ref 18.0–45.0)
Prealbumin: 8 mg/dL — ABNORMAL LOW (ref 18.0–45.0)

## 2010-05-17 LAB — IRON AND TIBC: UIBC: 174 ug/dL

## 2010-06-02 LAB — BASIC METABOLIC PANEL
CO2: 30 mEq/L (ref 19–32)
Calcium: 9.2 mg/dL (ref 8.4–10.5)
Chloride: 107 mEq/L (ref 96–112)
GFR calc Af Amer: >60 mL/min (ref >60)
Glucose, Bld: 93 mg/dL (ref 70–99)
Sodium: 144 mEq/L (ref 135–145)

## 2010-07-18 NOTE — Discharge Summary (Signed)
[place]  Patient:    CLARIVEL, CALLAWAY Visit Number: [id number] MRN: [id number]          Service Type: MED Location: [phone] Attending Physician:  Valera Castle Dictated by:   Renford Dills, M.D. Admit Date:  06/02/2001   CC:         Delorse Lek, M.D.   Discharge Summary  PROBLEM LIST: 1. Abdominal pain with mild ileus resolved at discharge. 2. Nausea, vomiting and diarrhea presumed secondary to gastroenteritis with    Clostridium difficile negative, resolved at discharge. 3. Mild chronic obstructive pulmonary disease exacerbation, resolved with    antibiotics, oxygen, nebulizers and steroids. 4. Hypothyroidism. TSH this admission 9.5 with further outpatient management    per primary doctor after checking the last time the medication was    titrated, patient with questionable heart disease. 5. History of congestive heart failure, unknown ejection fraction. 6. History of schizophrenia, stable without hallucinations, actually not on    any medications at this time. 7. Obesity. 8. Low grade temperature presumed secondary to #2, as stated the Clostridium    difficile was negative.  Chest x-ray negative.  Urine culture was    reincubated for better growth and is pending. 9. Allergy to PENICILLIN, SULFA, E-MYCIN.  DISCHARGE MEDICATIONS: 1. Atenolol 25 mg one q.d. 2. Synthroid 100 mcg one q.d. 3. Protonix 40 mg one q.d. 4. Albuterol MDI two puffs as needed q.4-6h. for shortness of breath,    wheezing. 5. Claritin 10 mg one q.d. times three days and then p.r.n. allergy symptoms.  DISPOSITION:  Patient is being discharged home under the care of her daughter and is asked to follow up with her primary care physician in one week.  CONSULTATIONS:  None.  STUDIES:  STOOL for Clostridium difficile negative.  BMET:  Essentially normal.  CBC:  Essentially normal.  AST, ALT:  23 and 11 respectively.  Total bilirubin 1.1, lipase  25.  URINALYSIS:  Essentially negative.  STOOL FOR OVA AND PARASITES: None.  ABDOMINAL FLATPLATE:  Nonobstructive bowel gas pattern, questionable mild ileus on date of admission.  Repeat abdominal film nonobstructive gas pattern, no signs of ileus.  HISTORY OF PRESENT ILLNESS:  The patient is a 77 year old female with multiple medical problems who was seen in her primary care physicians office on May 30, 2001 for a flare of her chronic obstructive pulmonary disease.   She was treated with doxycycline, Combivent and guaifenesin but apparently did not take any of her medications.  Later that evening the patient started to have vomiting as well as diarrhea, no blood identified.  The patient was brought to the emergency department for the above and was sent to have abdominal series which showed a mild ileus.  Admission was deemed necessary for further evaluation and treatment.  Please see the dictated history and physical for further details of the patients history of present illness.  HOSPITAL COURSE: #1 - CHRONIC OBSTRUCTIVE PULMONARY DISEASE EXACERBATION, MILD:  Failing outpatient treatment secondary to noncompliance.  The patient was admitted to the general medicine floor and was treated with oxygen, nebulizers and a quick taper of steroids.  Sputum culture was attempted but no sputum was obtained. The patients examination was consistent with expiratory wheezes which improved dramatically with the above treatment.  The patient was able to be tapered off the steroids prior to discharge and was not hypoxic at the time of discharge with saturations at 97% on room air.  There were no  other complications from the standpoint of chronic obstructive pulmonary disease. The patient will be given an inhaler to take home to use on a p.r.n. basis.  #2 - NAUSEA, VOMITING AND DIARRHEA, ABDOMINAL PAIN:  Which is resolved at the time of discharge.  As stated in the history of present illness the  patients symptoms began on an outpatient basis after failure to complete her treatment for chronic obstructive pulmonary disease exacerbation.  The patient denied any foreign ingestions, no seafood, no well water.  There was no infectious etiology identified.  Presumably the patient had a gastroenteritis versus complications from her chewing snuff causing the symptoms.  The patients stool was Clostridium difficile negative.  She was treated with intravenous fluids and started on a clear liquid diet slowly advanced to a full diet which she tolerated.  There were no complications.  #3 - ILEUS, SECONDARY TO #2:  Resolved at the time of discharge.  #4 - HISTORY OF HYPOTHYROIDISM: Laboratory in the hospital revealed TSH of 9.5 and will refer adjustments of the patients Synthroid to her primary care physician on outpatient basis after checking the last time her dosage was adjusted.  #5 - LOW GRADE TEMPERATURE:  Presumed secondary to #2.  As stated the urine culture was reincubated for better growth.  The urinalysis was essentially negative.  Chest x-ray was negative.  Clostridium difficile of stool was negative.   The patient did receive empiric antibiotics in the form of Tequin for her upper respiratory tract infection, therefore if she does have a urinary tract infection she should be adequately covered unless she has an unusual organism.  The patient has several other chronic medical problems which remained stable throughout this hospitalization.  She was maintained on her current treatments for those problems. Dictated by:   Renford Dills, M.D. Attending Physician:  Valera Castle DD:  06/06/01 TD:  06/06/01 Job: [id number]

## 2010-07-18 NOTE — H&P (Signed)
[name], SUMMONS               ACCOUNT NO.:  [id number]   MEDICAL RECORD NO.:  [id number]          PATIENT TYPE:  EMS   LOCATION:  MAJO                         FACILITY:  [facility]   PHYSICIAN:  Lonia Blood, M.D.DATE OF BIRTH:  1923-03-25   DATE OF ADMISSION:  10/24/2005  DATE OF DISCHARGE:                                HISTORY & PHYSICAL   PRIMARY CARE PHYSICIAN:  Maxwell Caul, M.D.   CHIEF COMPLAINT:  Diarrhea with nausea and vomiting.   HISTORY OF PRESENT ILLNESS:  [name] is an 77 year old female with  an extensive medical history as detailed below.  She reports that  approximately two days ago she experienced the abrupt onset of perfuse  watery diarrhea.  She reports that she has become incontinent of her stool  because of the high volume and frequency of her diarrhea episodes.  She  reports that her diarrhea is near continuous.  She has soiled her bed and  other areas of the home on multiple occasions since her onset.  She reports  that she has experienced what she feel are fevers at home.  She has not  actually checked her temperature.  She has also experienced severe nausea  with vomiting in attempts to drink or eat.  She has diffuse crampy abdominal  pain but no focal pain.  There has been no melena or hematochezia.  There  has been no hematemesis.  Prior to this, the patient reports that she had  been constipation for a couple of weeks but she denies using over-the-  counter laxatives to assist with treatment of her constipation.   REVIEW OF SYSTEMS:  Comprehensive review of systems is unremarkable with the  exception to positive elements noted in the history of present illness  above.   PAST MEDICAL HISTORY:  1. COPD per old records.  2. Paranoid schizophrenia on no chronic medications.  3. Cirrhosis, etiology unclear.  4. Reported history of CHF with no quantification available.  5. Hypothyroidism.  6. History of alcoholism.  7. Admission  for ileus in April 2003.  8. Status post hysterectomy.  9. Status post cholecystectomy.  10.Status post appendectomy.  11.Obesity.  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  13.Diverticulosis via barium enema April 2005.   MEDICATIONS:  1. Lasix 20 mg daily.  2. Synthroid 75 mcg daily.  3. Potassium chloride 20 mEq daily.   ALLERGIES:  1. PENICILLIN.  2. SULFA.  3. ERYTHROMYCIN.   FAMILY HISTORY:  Noncontributory secondary to age.   SOCIAL HISTORY:  No smoking at present.  The patient does dip snuff.  She  lives in [place].  She lives with her daughter.   LABORATORY DATA:  Hemoglobin 12.  White count and platelet count normal.  MCV normal.  Sodium normal, potassium low at 3.2.  Chloride, bicarb, BUN and  creatinine normal.  Serum glucose 104, calcium 9.0.  AST and ALT normal.  Alkaline phosphate and total bilirubin normal.  Total protein low at 5.6.  Albumin low at 3.0.  Urinalysis reveals 15 of ketones but otherwise  negative.  KUB reveals multiple thick-walled loops of the small bowel with  air-fluid levels with no other acute disease.   PHYSICAL EXAMINATION:  VITAL SIGNS:  Temperature 100.6, blood pressure  134/72, heart rate 94, respiratory rate 20.  O2 saturation 91% on room air.  GENERAL:  Well-developed, well-nourished female in no acute respiratory  distress.  HEENT:  Normocephalic and atraumatic.  Pupils are equal, round, and reactive  to light and accommodation.  Extraocular movements intact bilaterally.  OC/OP clear.  NECK:  No JVD.  No lymphadenopathy.  LUNGS:  Clear to auscultation bilaterally without wheezing or rhonchi.  CARDIOVASCULAR:  Regular rate and rhythm without murmur, rub, or gallop.  Normal S1 and S2.  ABDOMEN:  A very large transverse lower abdominal scar with no skin  breakdown.  Bowel sounds are markedly hypoactive.  The patient is tender to  deep palpation throughout all four quadrants.  There is no rebound.  There   is no ascites.  There is no distension.  There is no appreciable mass.  EXTREMITIES:  Trace bilateral lower extremity edema without significant  clubbing, cyanosis, or edema.  NEUROLOGICAL:  Alert and oriented x4.  Cranial nerves II through XII intact  bilaterally.  5/5 strength throughout bilateral upper and lower extremities.  Intact sensation and touch throughout.  No Babinski.   IMPRESSION/PLAN:  1. Intractable diarrhea with nausea and vomiting:  This very likely      represents a simple viral gastroenteritis.  The patient does report      multiple very liquid stools, however, and states that they are      extremely foul-smelling.  This raises a question of possible      Clostridium difficile colitis.  The patient will be placed on      precautionary isolation precautions.  I will treat her empirically with      Flagyl as my index suspicion is high enough to support this.  Stool      will be sent for Clostridium difficile toxin x3 total.  We must also      consider the possibility of an early ileus.  We will follow clinical      exam.  There is no clinical evidence to suggest a diverticulitis at the      present time.  However, if the patient fails to improve, we will      consider CT scan of the abdomen.  2. Cystic mass of the pancreatic head:  Review of old records reveals a      cystic mass that was noted on MRI and the pancreatic head on April 19, 2005.  Recommendation at that time was to follow up with a MRI in      approximately six months.  We are now in that window.  Once the patient      has stabilized, we will proceed with abdominal MRI during this hospital      stay to reevaluate the patient's known pancreatic mass.  Her extensive      history of previous alcohol abuse would put her at risk for      pancreatitis and scarring but would also put her at risk for pancreatic      cancer as well. 3. Hypokalemia:  This is felt to be secondary to gastrointestinal loss.       We will replete the patient both via p.o. route as tolerated and via IV      infusion.  We will check  a magnesium level.  4. Hypothyroidism:  We will continue Synthroid.      Lonia Blood, M.D.  Electronically Signed     JTM/MEDQ  D:  10/24/2005  T:  10/24/2005  Job:  [id number]   cc:   Maxwell Caul, M.D.

## 2010-07-18 NOTE — Discharge Summary (Signed)
Melanie Roberson, Melanie Roberson               ACCOUNT NO.:  [id number]   MEDICAL RECORD NO.:  [id number]          PATIENT TYPE:  INP   LOCATION:  5123                         FACILITY:  [facility]   PHYSICIAN:  Hillery Aldo, M.D.   DATE OF BIRTH:  Jul 04, 1923   DATE OF ADMISSION:  10/24/2005  DATE OF DISCHARGE:  10/29/2005                                 DISCHARGE SUMMARY   DISCHARGE DIAGNOSES:  1. Diverticulitis.  2. Enteritis.  3. Chronic obstructive pulmonary disease.  4. History of cirrhosis.  5. Compensated congestive heart failure.  6. Hypertension.  7. Hypothyroidism.  8. Stable cystic pancreatic mass  9. Anemia.  10.Small anterior midline hernia.  11.Mild thrombocytopenia.   DISCHARGE MEDICATIONS:  1. Lasix 20 mg daily.  2. Potassium chloride 20 mEq daily.  3. Synthroid 75 mcg daily.  4. Cipro 500 mg b.i.d. x4 more days.  5. Flagyl 500 mg q.i.d. x4 more days.   CONSULTATION:  None.   BRIEF ADMISSION HISTORY OF PRESENT ILLNESS:  The patient is an 77 year old  female who presented to the emergency department with abrupt onset of  profuse watery diarrhea progressing to the point of incontinence.  She also  had subjective fevers, nausea, vomiting, and anorexia.  She was admitted for  further evaluation and workup.   PROCEDURES AND DIAGNOSTIC STUDIES:  1. Acute abdominal series on 10/24/2005 showed thick walled loops of small      bowel with a few scattered air-fluid levels.  There was a question of      small bowel edema, possibly related to related to enteritis,      nonspecific.  It was negative for acute cardiac or pulmonary processes.  2. CT scan of the abdomen and pelvis on 10/25/2005 showed a small air      fluid level and nondilated small bowel consistent with enteritis.      Changes of cirrhosis were noted.  There is a tiny cystic lesion along      the course of the pancreatic duct in the head of the pancreas, less      prominent than suggested on prior MRI, measuring 8  mm in diameter and      not associated with pancreatic duct dilatation.  It was thought that      this could possibly represent a tiny cystic neoplastic process such as      intraductal papillary mucinous tumor.  It was, however, stable and not      enlarging over the past 6 months.  There was mild splenomegaly.  There      was a small anterior midline hernia just above the umbilicus containing      adipose tissue.  There was mild inflammatory stranding along the      proximal sigmoid colon associated with a small amount of free pelvic      fluid suspicious for diverticulitis but not as consistent with      Clostridium difficile colitis.   DISCHARGE LABORATORY VALUES:  Stool cultures were negative for Salmonella,  Shigella, campylobacter, or Ursinia.  The sodium was 139, potassium 4.3,  chloride 112,  bicarb 24 , BUN 5, creatinine 0.8, glucose 92, white blood  cell count was 3.4, hemoglobin 10.6, hematocrit 30.9, MCV 97.9, platelet  count 108.  Free T4 was 1.24, TSH mildly low at 0.185, and ova and parasites  were negative.  Clostridium difficile studies were negative.   HOSPITAL COURSE:  PROBLEM #1:  Nausea, vomiting, and diarrhea secondary to  diverticulitis/enteritis.  The patient was empirically treated with IV Cipro and Flagyl.  She was  maintained on these medications once the CT findings were noted.  As her  symptoms improved, her diet was advanced and she was transitioned over to  p.o. antibiotics.  At time of discharge, she is tolerating a normal diet and  has no further complaints of nausea, vomiting, or diarrhea.  She will  complete a full 10-day course of Cipro and Flagyl.   PROBLEM #2:  Chronic obstructive pulmonary disease.  The patient was symptom free throughout the course of hospitalization.   PROBLEM #3:  History of cirrhosis.  Cirrhosis has been confirmed on CT scan findings.  She remained stable with  regard to this issue.   PROBLEM #4:  History of compensated  congestive heart failure.  No acute decompensation noted during course of her hospitalization.   PROBLEM #5:  Hypertension.  The patient did become hypertensive during the course of her hospitalization  secondary to holding Lasix.  She was started on clonidine for blood pressure  control.  She can discontinue this and resume Lasix on discharge.   PROBLEM #6:  Hypothyroidism.  The patient's TSH was checked and found to be low.  It is difficult to  interpret this in the setting of acute illness and therefore free T4 was  obtained which was normal.  No adjustments were made to her Synthroid.   PROBLEM #7:  History of pancreatic head cystic mass.  This mass was visualized on CT and appears to be stable and not enlarging at  6 months.  Further follow up per primary care physician.   PROBLEM #8:  Anemia.  The patient is mildly normocytically anemic.  She should have further  evaluation for this and workup as an outpatient per her primary care  physician.   DISPOSITION:  The patient is stable for discharge home and is currently  symptom free.  She has been evaluated by the physical therapist and  occupational therapist who feels she is safe for return home.  She does not  have any equipment needs.  We will, therefore, discharge her home today and  advise her to follow up with her primary care physician early next week or  sooner if symptoms reoccur.      Hillery Aldo, M.D.  Electronically Signed     CR/MEDQ  D:  10/29/2005  T:  10/30/2005  Job:  [id number]   cc:   Maxwell Caul, M.D.

## 2010-07-18 NOTE — Op Note (Signed)
NAME:  Melanie Roberson, Melanie Roberson                         ACCOUNT NO.:  [id number]   MEDICAL RECORD NO.:  [id number]                   PATIENT TYPE:  AMB   LOCATION:  D[facility]                                 FACILITY:  [facility]   PHYSICIAN:  Anselmo Rod, M.D.               DATE OF BIRTH:  Jun 07, 1923   DATE OF PROCEDURE:  04/04/2003  DATE OF DISCHARGE:                                 OPERATIVE REPORT   PROCEDURE:  Screening colonoscopy.   ENDOSCOPIST:  Charna Elizabeth, M.D.   INSTRUMENT USED:  Olympus video colonoscope.   INDICATIONS FOR PROCEDURE:  77 year old white female with a personal history  of colonic polyps and recent rectal bleeding, rule out recurrent polyps.   PREPROCEDURE PREPARATION:  Informed consent was obtained from the patient.  The patient was fasted for eight hours prior to the procedure and prepped  with a bottle of magnesium citrate and a gallon of GoLYTELY the night prior  to the procedure.   PREPROCEDURE PHYSICAL:  Patient with stable vital signs.  Neck supple.  Chest clear to auscultation.  Abdomen obese, nontender, with normal bowel  sounds.   DESCRIPTION OF PROCEDURE:  The patient was placed in the left lateral  decubitus position, sedated with 60 mg of Demerol and 6 mg Versed  intravenously.  Once the patient was adequately sedated, maintained on low  flow oxygen and continuous cardiac monitoring, the Olympus video colonoscope  was advanced into the rectum to the cecum with extreme difficulty.  There  was a large amount of solid stool in the cecum, multiple washings were done.  The patient's position was changed multiple times but the cecal base could  not be visualized.  There was a large amount of residual stool in the  dependent areas of the colon.  The patient had pandiverticulosis with more  prominent changes in the sigmoid colon.  Small internal hemorrhoids were  seen on retroflexion.  Small lesions could have been missed.   RECOMMENDATIONS:  An air contrast  barium enema would be done after repeat  prep and the patient will be asked to follow up in the office thereafter.                                               Anselmo Rod, M.D.    JNM/MEDQ  D:  04/04/2003  T:  04/04/2003  Job:  [id number]   cc:   Teena Irani. Arlyce Dice, M.D.  [address]  [place]  [place] [address]  Fax: [phone]

## 2010-07-18 NOTE — Op Note (Signed)
NAME:  Melanie Roberson, Melanie Roberson                         ACCOUNT NO.:  [id number]   MEDICAL RECORD NO.:  [id number]                   PATIENT TYPE:  AMB   LOCATION:  DFTL                                 FACILITY:  [facility]   PHYSICIAN:  Anselmo Rod, M.D.               DATE OF BIRTH:  12/31/1923   DATE OF PROCEDURE:  DATE OF DISCHARGE:                                 OPERATIVE REPORT   Audio too short to transcribe (less than 5 seconds)                                               Anselmo Rod, M.D.    JNM/MEDQ  D:  04/04/2003  T:  04/04/2003  Job:  [id number]

## 2010-07-18 NOTE — H&P (Signed)
[place]  Patient:    Melanie Roberson, Melanie Roberson Visit Number: [id number] MRN: [id number]          Service Type: MED Location: [phone] Attending Physician:  Katy Apo. Dictated by:   Delorse Lek, M.D. Admit Date:  06/02/2001 Discharge Date: 06/06/2001                           History and Physical  This is a 77 year old white female with past medical history significant for COPD, paranoid schizophrenia, cirrhosis, CHF, arthritis, hypothyroidism and alcoholism (none in the last 18 years).  The reliability of the history is questionable.  CHIEF COMPLAINT:  "I cant stop vomiting."  HISTORY OF PRESENT ILLNESS:  The patient was seen in the office on May 30, 2001 with a flare of her COPD.  She was given doxycycline, Combivent and guaifenesin/dextromethorphan; unfortunately, she has not taken any of those medications.  Later in that evening, she started having vomiting; over the next day or two, she started having diarrhea.  She is throwing up frequently since then.  Fortunately, she has not vomited in about the past eight hours. She has had no fever that was documented; she has felt warm.  She has had no melena, BRBPR, hematemesis.  Because she was feeling much weaker, she was brought to the emergency room where an acute abdominal series revealed an ileus.  She is admitted for IV fluids, etc.  PAST MEDICAL HISTORY:  Hospitalization and surgery:  She was hospitalized by Dr. Eliezer Bottom in 2000 for psychiatric reasons (apparently schizophrenia).  She had a biopsy done many years ago that was benign. Apparently, while she was being taken to the radiology department for x-rays/biopsies, she had a cardiac arrest.  She was admitted at that time and apparently her heart was found to be okay; I unfortunately do not have those records.  Rest of this is difficult to obtain. I believe she has had a hysterectomy.  MEDICATIONS: 1. Synthroid  100 mcg p.o. q.d. 2. Tenormin 25 mcg p.o. q.d.  ILLNESSES:  Only as above.  ALLERGIES:  PENICILLIN, SULFA and ERYTHROMYCIN.  SOCIAL HISTORY:  She dipped snuff, does not smoke, has not had a drink in 18 years, does no illicit drugs.  FAMILY HISTORY:  Lives with daughter.  There is hypertension, obesity and some form of cancer.  REVIEW OF SYSTEMS:  Review of systems is really unobtainable.  She rambled frequently and does not make a lot of sense.  PHYSICAL EXAMINATION:  GENERAL:  Well-developed, well-nourished white female in no acute distress.  VITAL SIGNS:  Pulse of 97, respirations 22, 160/60, room air PO2 is 95.  HEENT:  Normocephalic, atraumatic.  Pupils equal, reactive.  TMs and EACs okay.  Oropharynx:  Mucous membranes are moist.  No recent lesions.  NECK:  Supple.  Thyroid not enlarged.  No significant nodes.  LUNGS:  Expiratory wheezes and rhonchi.  COR:  Normal S1 and S2.  No murmurs, gallops or rubs.  ABDOMEN:  Soft, nontender.  No masses except there is a small lipoma on the right side of her abdomen near the level of her umbilicus.  There is no guarding or rebound.  RECTAL:  Done in the ER and stool was guaiac-negative; I did not do this.  EXTREMITIES:  Trace edema bilaterally.  Pulses are present.  NEUROLOGIC:  Non-lateralizing.  LABORATORY AND ACCESSORY DATA:  Electrolytes normal.  Glucose is 122.  White count is 9600, hemoglobin 13.2.  SGPT 11, SGOT 23, lipase 25.  Urine specific gravity is 1.029.  Acute abdominal series/chest x-ray:  Chest x-ray shows COPD, rest shows an ileus.  ASSESSMENT AND PLAN: 1. Ileus.  I suspect this is more of a viral gastroenteritis.  We will give    her sips and chips, intravenous fluids and recheck in a.m. 2. Chronic obstructive pulmonary disease.  Nebulizer and guaifenesin. 3. Arthritis. 4. Schizophrenia, stable. 5. History of congestive heart failure/cardiac arrest?  Will try to obtain    those records. 6.  Hypothyroidism.  Continue Synthroid. 7. History of alcoholism.  Has had none in 18 years.  I think this is no    longer an issue. Dictated by:   Delorse Lek, M.D. Attending Physician:  Renford Dills D. DD:  06/02/01 TD:  06/03/01 Job: [id number]

## 2010-11-20 LAB — CBC
HCT: 30.5 — ABNORMAL LOW
Hemoglobin: 10.7 — ABNORMAL LOW
MCHC: 34.9
MCV: 99.9
Platelets: 125 — ABNORMAL LOW
RDW: 13.4

## 2010-11-20 LAB — BASIC METABOLIC PANEL
BUN: 15
CO2: 28
Chloride: 102
Glucose, Bld: 96
Potassium: 4.5
Sodium: 135

## 2010-11-20 LAB — DIFFERENTIAL
Eosinophils Absolute: 0.2
Eosinophils Relative: 4
Lymphocytes Relative: 22
Monocytes Absolute: 0.3

## 2010-11-20 LAB — URINE MICROSCOPIC-ADD ON

## 2010-11-20 LAB — URINALYSIS, ROUTINE W REFLEX MICROSCOPIC
Glucose, UA: NEGATIVE
Hgb urine dipstick: NEGATIVE
Specific Gravity, Urine: 1.016
Urobilinogen, UA: >8 — ABNORMAL HIGH

## 2012-05-18 ENCOUNTER — Non-Acute Institutional Stay (SKILLED_NURSING_FACILITY): Payer: No Typology Code available for payment source | Admitting: Adult Health

## 2012-05-18 DIAGNOSIS — F0150 Vascular dementia without behavioral disturbance: Secondary | ICD-10-CM

## 2012-05-18 DIAGNOSIS — K5900 Constipation, unspecified: Secondary | ICD-10-CM

## 2012-05-18 DIAGNOSIS — I509 Heart failure, unspecified: Secondary | ICD-10-CM

## 2012-05-18 DIAGNOSIS — E039 Hypothyroidism, unspecified: Secondary | ICD-10-CM

## 2012-05-18 DIAGNOSIS — F3289 Other specified depressive episodes: Secondary | ICD-10-CM

## 2012-05-18 DIAGNOSIS — F329 Major depressive disorder, single episode, unspecified: Secondary | ICD-10-CM

## 2012-05-19 DIAGNOSIS — F0150 Vascular dementia without behavioral disturbance: Secondary | ICD-10-CM | POA: Insufficient documentation

## 2012-05-19 DIAGNOSIS — I509 Heart failure, unspecified: Secondary | ICD-10-CM | POA: Insufficient documentation

## 2012-05-19 DIAGNOSIS — F329 Major depressive disorder, single episode, unspecified: Secondary | ICD-10-CM | POA: Insufficient documentation

## 2012-06-08 ENCOUNTER — Encounter: Payer: Self-pay | Admitting: Adult Health

## 2012-06-08 NOTE — Assessment & Plan Note (Signed)
She is stable is taking cymbalta 30 mg daily

## 2012-06-08 NOTE — Assessment & Plan Note (Signed)
Takes miralax daily takes senna 2 tabs twice daily

## 2012-06-08 NOTE — Assessment & Plan Note (Signed)
Is stable is taking lasix 40 mg daily with k+ 20 meq daily  

## 2012-06-08 NOTE — Progress Notes (Signed)
Patient ID: Melanie Roberson, female   DOB: 1924-01-23, 79 y.o.   MRN: [id number]  Chief Complaint  Patient presents with  . Medical Managment of Chronic Issues    HPI:  Congestive heart failure, unspecified Is stable is taking lasix 40 mg daily with k+ 20 meq daily   HYPOTHYROIDISM, UNSPECIFIED Is stable is taking synthroid 100 mcg daily   Unspecified constipation Takes miralax daily takes senna 2 tabs twice daily   Depressive disorder, not elsewhere classified She is stable is taking cymbalta 30 mg daily    No past medical history on file.  No past surgical history on file.  VITAL SIGNS BP 152/67  Pulse 61  Temp(Src) 97.9 F (36.6 C)  Resp 18  Ht 5\' 6"  (1.676 m)  Wt 154 lb (69.854 kg)  BMI 24.87 kg/m2   Patient's Medications  New Prescriptions   No medications on file  Previous Medications   DULOXETINE (CYMBALTA) 30 MG CAPSULE    Take 30 mg by mouth daily.   FUROSEMIDE (LASIX) 40 MG TABLET    Take 40 mg by mouth daily.   LACTULOSE (CHRONULAC) 10 GM/15ML SOLUTION    Take 30 g by mouth every morning.    LEVOTHYROXINE (SYNTHROID, LEVOTHROID) 100 MCG TABLET    Take 100 mcg by mouth daily.   POLYETHYL GLYCOL-PROPYL GLYCOL (SYSTANE ULTRA OP)    Apply 1 drop to eye 3 (three) times daily. 1 drop to each eye tid   POLYETHYLENE GLYCOL POWDER (MIRALAX) POWDER    Take 17 g by mouth daily. For constipation. Dispense 1 month supply.    POTASSIUM CHLORIDE SA (K-DUR,KLOR-CON) 20 MEQ TABLET    Take 20 mEq by mouth daily.   SENNA (SENOKOT) 8.6 MG TABLET    Take 2 tablets by mouth 2 (two) times daily.   Modified Medications   No medications on file  Discontinued Medications   HALOPERIDOL (HALDOL) 0.5 MG TABLET    Take 0.5 mg by mouth 2 (two) times daily.     LEVOTHYROXINE (SYNTHROID, LEVOTHROID) 112 MCG TABLET    Take 112 mcg by mouth daily.     TRAMADOL (ULTRAM) 50 MG TABLET    Take 2 tablets by mouth three times a day as needed for pain.     SIGNIFICANT DIAGNOSTIC  EXAMS  04-27-12: chest x-ray: no acute cardiopulmonary disease  LAB REVIEWED:  04-04-12: tsh 0.260 04-20-12: tsh 0.619   .ROS  .PHYSEXAM    ASSESSMENT/ PLAN:   Will not make changes at this time; will continue her current regimen and will continue to monitor her status; will make changes in the future as indicated.   FUTURE ORDERS:  None

## 2012-06-08 NOTE — Assessment & Plan Note (Signed)
Is stable is taking synthroid 100 mcg daily

## 2012-10-25 ENCOUNTER — Encounter: Payer: Self-pay | Admitting: Internal Medicine

## 2012-10-25 ENCOUNTER — Non-Acute Institutional Stay (SKILLED_NURSING_FACILITY): Payer: No Typology Code available for payment source | Admitting: Internal Medicine

## 2012-10-25 DIAGNOSIS — K5909 Other constipation: Secondary | ICD-10-CM

## 2012-10-25 DIAGNOSIS — K5900 Constipation, unspecified: Secondary | ICD-10-CM

## 2012-10-25 DIAGNOSIS — E039 Hypothyroidism, unspecified: Secondary | ICD-10-CM

## 2012-10-25 DIAGNOSIS — F0150 Vascular dementia without behavioral disturbance: Secondary | ICD-10-CM

## 2012-10-25 DIAGNOSIS — F32A Depression, unspecified: Secondary | ICD-10-CM

## 2012-10-25 DIAGNOSIS — F329 Major depressive disorder, single episode, unspecified: Secondary | ICD-10-CM

## 2012-10-25 DIAGNOSIS — F3289 Other specified depressive episodes: Secondary | ICD-10-CM

## 2012-10-25 DIAGNOSIS — N390 Urinary tract infection, site not specified: Secondary | ICD-10-CM

## 2012-10-25 DIAGNOSIS — S5012XA Contusion of left forearm, initial encounter: Secondary | ICD-10-CM

## 2012-10-25 DIAGNOSIS — S5010XA Contusion of unspecified forearm, initial encounter: Secondary | ICD-10-CM

## 2012-10-25 NOTE — Progress Notes (Signed)
Patient ID: Melanie Roberson, female   DOB: 06-Feb-1924, 79 y.o.   MRN: [id number] Location:  [facility] Provider:  Gwenith Spitz. Renato Gails, D.O., C.M.D.  Code Status:  Full code   Chief Complaint  Patient presents with  . Medical Managment of Chronic Issues    HPI:  79 yo white female here for long term care with h/o schizophrenia, vascular dementia, essential tremor, depression and chf.  She is doing well w/o complaints.  Staff mention that she has hit her arm on her wheelchair and has a left arm bruise and hematoma of her forearm--it is unclear exactly how she got this injury.  She had a urine sample done for some reason (ordered by on call) that has come back positive for infection.  Review of Systems:  Review of Systems  Constitutional: Negative for fever.  HENT: Negative for congestion.   Eyes: Negative for blurred vision.  Respiratory: Negative for shortness of breath.   Cardiovascular: Negative for chest pain.  Gastrointestinal: Negative for constipation.  Genitourinary: Negative for dysuria.  Musculoskeletal: Negative for falls.  Skin: Negative for rash.  Neurological: Positive for tremors. Negative for weakness.  Endo/Heme/Allergies: Bruises/bleeds easily.  Psychiatric/Behavioral: Positive for hallucinations and memory loss.    Medications: Patient's Medications  New Prescriptions   No medications on file  Previous Medications   DULOXETINE (CYMBALTA) 30 MG CAPSULE    Take 30 mg by mouth daily.   FUROSEMIDE (LASIX) 40 MG TABLET    Take 40 mg by mouth daily.   LACTULOSE (CHRONULAC) 10 GM/15ML SOLUTION    Take 30 g by mouth every morning.    LEVOTHYROXINE (SYNTHROID, LEVOTHROID) 100 MCG TABLET    Take 100 mcg by mouth daily.   POLYETHYL GLYCOL-PROPYL GLYCOL (SYSTANE ULTRA OP)    Apply 1 drop to eye 3 (three) times daily. 1 drop to each eye tid   POLYETHYLENE GLYCOL POWDER (MIRALAX) POWDER    Take 17 g by mouth daily. For constipation. Dispense 1 month supply.    POTASSIUM CHLORIDE SA (K-DUR,KLOR-CON) 20 MEQ TABLET    Take 20 mEq by mouth daily.   SENNA (SENOKOT) 8.6 MG TABLET    Take 2 tablets by mouth 2 (two) times daily.   Modified Medications   No medications on file  Discontinued Medications   No medications on file    Physical Exam: Filed Vitals:   10/25/12 1514  BP: 130/64  Pulse: 66  Temp: 97.7 F (36.5 C)  Resp: 20  Height: 5\' 6"  (1.676 m)  Weight: 153 lb (69.4 kg)  Physical Exam  Constitutional: No distress.  HENT:  Head: Normocephalic and atraumatic.  Cardiovascular: Normal rate, regular rhythm, normal heart sounds and intact distal pulses.   Pulmonary/Chest: Effort normal and breath sounds normal. No respiratory distress.  Abdominal: Soft. Bowel sounds are normal. She exhibits no distension and no mass. There is no tenderness.  Musculoskeletal: Normal range of motion.  Rolls herself around facility in wheelchair  Neurological: She is alert.  Skin:  Large hematoma of left forearm and some smaller bruises also surrounding the area  . Urine culture:  Providencia stuartii >100K sensitive to imipenem, ceftriaxone, cefepime, bactrim  Assessment/Plan 1. UTI (urinary tract infection) Rocephin 1g IM daily for 7 days and florastor 250mg  po bid x 10 days Push po fluids  2. Traumatic hematoma of left forearm Monitor for healing and superimposed infection Padding on wheelchair  3. Vascular dementia, uncomplicated Stable, records show h/o schizophrenia which I was unaware  of until today, not on dementia meds, requires assistance with adls except feeding  4. Unspecified hypothyroidism Thyroid stable with synthroid  5. Chronic constipation -stable with senna, miralax, lactulose  6. Depression -cont cymbalta  Family/ staff Communication: seen with unit supervisor Goals of care: full code Labs/tests ordered:  Cbc, bmp, tsh next draw

## 2012-11-29 ENCOUNTER — Non-Acute Institutional Stay (SKILLED_NURSING_FACILITY): Payer: No Typology Code available for payment source | Admitting: Internal Medicine

## 2012-11-29 DIAGNOSIS — F329 Major depressive disorder, single episode, unspecified: Secondary | ICD-10-CM

## 2012-11-29 DIAGNOSIS — E507 Other ocular manifestations of vitamin A deficiency: Secondary | ICD-10-CM

## 2012-11-29 DIAGNOSIS — F0150 Vascular dementia without behavioral disturbance: Secondary | ICD-10-CM

## 2012-11-29 DIAGNOSIS — E039 Hypothyroidism, unspecified: Secondary | ICD-10-CM

## 2012-11-29 DIAGNOSIS — E538 Deficiency of other specified B group vitamins: Secondary | ICD-10-CM

## 2012-11-29 DIAGNOSIS — H11149 Conjunctival xerosis, unspecified, unspecified eye: Secondary | ICD-10-CM

## 2012-11-29 DIAGNOSIS — F32A Depression, unspecified: Secondary | ICD-10-CM

## 2012-11-29 DIAGNOSIS — F3289 Other specified depressive episodes: Secondary | ICD-10-CM

## 2012-11-29 DIAGNOSIS — K5909 Other constipation: Secondary | ICD-10-CM

## 2012-11-29 DIAGNOSIS — K5900 Constipation, unspecified: Secondary | ICD-10-CM

## 2013-01-10 ENCOUNTER — Non-Acute Institutional Stay (SKILLED_NURSING_FACILITY): Payer: No Typology Code available for payment source | Admitting: Adult Health

## 2013-01-10 DIAGNOSIS — I5032 Chronic diastolic (congestive) heart failure: Secondary | ICD-10-CM

## 2013-01-10 DIAGNOSIS — F329 Major depressive disorder, single episode, unspecified: Secondary | ICD-10-CM

## 2013-01-10 DIAGNOSIS — K5900 Constipation, unspecified: Secondary | ICD-10-CM

## 2013-01-10 DIAGNOSIS — F0150 Vascular dementia without behavioral disturbance: Secondary | ICD-10-CM

## 2013-01-10 DIAGNOSIS — E039 Hypothyroidism, unspecified: Secondary | ICD-10-CM

## 2013-01-10 DIAGNOSIS — F3289 Other specified depressive episodes: Secondary | ICD-10-CM

## 2013-01-10 DIAGNOSIS — I509 Heart failure, unspecified: Secondary | ICD-10-CM

## 2013-01-10 NOTE — Progress Notes (Signed)
Patient ID: Melanie Roberson, female   DOB: 08/23/1923, 79 y.o.   MRN: [id number] Location:  [facility] Provider:  Gwenith Spitz. Renato Gails, D.O., C.M.D.  Code Status:  Full Code  Chief Complaint  Patient presents with  . Medical Managment of Chronic Issues    HPI: 79 yo female seen for routine visit for medical management of chronic issues.  She had no concerns today.  Doing well.  Staff have no concerns.  She continues to get around in her wheelchair.  Review of Systems:  Review of Systems  Constitutional: Negative for fever.  Eyes: Negative for blurred vision.  Respiratory: Negative for shortness of breath.   Cardiovascular: Negative for chest pain.  Gastrointestinal: Negative for abdominal pain.  Genitourinary: Negative for dysuria.  Musculoskeletal: Positive for joint pain.  Neurological: Negative for dizziness, loss of consciousness and headaches.  Endo/Heme/Allergies: Bruises/bleeds easily.  Psychiatric/Behavioral: Positive for memory loss. Negative for depression.    Medications: Patient's Medications  New Prescriptions   No medications on file  Previous Medications   CYANOCOBALAMIN 500 MCG TABLET    Take 500 mcg by mouth daily.   DULOXETINE (CYMBALTA) 30 MG CAPSULE    Take 30 mg by mouth daily.   FUROSEMIDE (LASIX) 40 MG TABLET    Take 40 mg by mouth daily.   GUAIFENESIN (ROBITUSSIN) 100 MG/5ML SOLN    Take 5 mLs by mouth every 6 (six) hours as needed (cough).   IPRATROPIUM-ALBUTEROL (DUONEB) 0.5-2.5 (3) MG/3ML SOLN    Take 3 mLs by nebulization daily as needed (wheezing).   LACTULOSE (CHRONULAC) 10 GM/15ML SOLUTION    Take 30 g by mouth every morning.    LEVOTHYROXINE (SYNTHROID, LEVOTHROID) 100 MCG TABLET    Take 100 mcg by mouth daily.   MULTIPLE VITAMINS-MINERALS (MULTIVITAMIN WITH MINERALS) TABLET    Take 1 tablet by mouth daily.   POLYETHYLENE GLYCOL POWDER (MIRALAX) POWDER    Take 17 g by mouth daily. For constipation. Dispense 1 month supply.    POLYVINYL ALCOHOL-POVIDONE (HYPOTEARS) 1.4-0.6 % OPHTHALMIC SOLUTION    Place 1 drop into both eyes 3 (three) times daily.   POTASSIUM CHLORIDE SA (K-DUR,KLOR-CON) 20 MEQ TABLET    Take 20 mEq by mouth daily.   SENNA (SENOKOT) 8.6 MG TABLET    Take 2 tablets by mouth 2 (two) times daily.   Modified Medications   No medications on file  Discontinued Medications   No medications on file    Physical Exam: BP118/60, Resp 20, pulse 80, O2 96%, Temp 98.9, Wt 157lb, ht 5'6"  Physical Exam  Constitutional: No distress.  HENT:  Head: Normocephalic and atraumatic.  Cardiovascular: Normal rate, regular rhythm, normal heart sounds and intact distal pulses.   Pulmonary/Chest: Effort normal and breath sounds normal. No respiratory distress.  Abdominal: Soft. Bowel sounds are normal. She exhibits no distension and no mass. There is no tenderness.  Musculoskeletal: Normal range of motion.  Neurological: She is alert.  Skin: Skin is warm and dry.  Psychiatric: She has a normal mood and affect.     Labs reviewed: WBC 3.35, HGB 10.7, Hematocrit 31.8, plt 150, Na 143, K 3.9, BUN 17, Cr 0.72, TSH 0.456  Assessment/Plan 1. Unspecified hypothyroidism - controlled: Synthroid daily  2. Vascular dementia, uncomplicated - Stable;  Pleasantly confused, dependent in ADLs except feeding and gets around via wheelchair, is verbal and conversive;  Also has schizophrenia by history  3. Depressive disorder, not elsewhere classified - Controlled: Cymbalta 30mg  daily  4. Unspecified constipation - Stable: Miralax and Senokot  5.  Chronic diastolic chf:  Cont lasix, stable w/o evidence of volume overload  Family/ staff Communication: seen with unit supervisor  Goals of care: full code  Labs/tests ordered:  None needed at present

## 2013-02-22 ENCOUNTER — Non-Acute Institutional Stay (SKILLED_NURSING_FACILITY): Payer: No Typology Code available for payment source | Admitting: Internal Medicine

## 2013-02-22 DIAGNOSIS — E039 Hypothyroidism, unspecified: Secondary | ICD-10-CM

## 2013-02-22 DIAGNOSIS — F329 Major depressive disorder, single episode, unspecified: Secondary | ICD-10-CM

## 2013-02-22 DIAGNOSIS — S40029A Contusion of unspecified upper arm, initial encounter: Secondary | ICD-10-CM

## 2013-02-22 DIAGNOSIS — I509 Heart failure, unspecified: Secondary | ICD-10-CM

## 2013-02-22 DIAGNOSIS — K5900 Constipation, unspecified: Secondary | ICD-10-CM

## 2013-02-22 DIAGNOSIS — J209 Acute bronchitis, unspecified: Secondary | ICD-10-CM

## 2013-02-22 NOTE — Progress Notes (Signed)
Patient ID: Melanie Roberson, female   DOB: Jul 28, 1923, 79 y.o.   MRN: [id number]     [name] [place]  Allergies  Allergen Reactions  . Erythromycin   . Penicillins   . Sulfa Antibiotics     Chief Complaint  Patient presents with  . Medical Managment of Chronic Issues    HPI 79 y/o female is here for long term care and seen today for routine visit. She has no concerns this visit. CNA noticed bruise in her right arm this am. Pt not sure when this started and does not remember how this happened. She mentions she could have bump into something but not for sure She also has been having productive cough with greenish yellow sputum and decreased po intake for 2 days  ROS Limited with her memory No pain in her arm No fever or chills No nausea or vomiting denies chest pain and dyspnea No falls reported  Medication reviewed. See [facility]  Physical exam BP 136/74  Pulse 74  Temp(Src) 96.8 F (36 C)  Resp 18  Wt 158 lb (71.668 kg)  SpO2 96%  General- elderly female in no acute distress Head- atraumatic, normocephalic Eyes- PERRLA, EOMI, no pallor, no icterus Neck- no lymphadenopathy Chest- no chest wall deformities, no chest wall tenderness Cardiovascular- normal s1,s2, no murmurs/ rubs/ gallops Respiratory- bilateral poor air entry and rhonchi noted, no wheeze or crackles Abdomen- bowel sounds present, soft, non tender Musculoskeletal- able to move her extremities, wheels herself in wheelchair, good radial pulses, trace edema in both feet Neurological- no focal deficit Skin- ecchymoses in both arms around the elbow area and a small blood filled blister in right arm Psychiatry- alert and oriented to person, normal mood and affect  Labs 11/02/12 wbc 4.8, hb 10.7, hct 31.8, plt 150, na 143, k 3.9, bun 17, cr 0.72, ca 8.7, tsh 0.456  Assessment/plan  arm bruise- likely from trauma, skin care to be provided and monitor the blister, avoid any dressing for now. Check cbc and  coags  Acute bronchitis- with her cough, poor po intake and lung findings, to start avelox 400 mg daily for now and get cxr. Continue her bronchodilator and cough expectorant  Depression- stable with her current dose of cymbalta  chf- euvolemic on exam. Monitor weight. Continue lasix  Hypothyroidism- continue levothyroxine and monitor clinically.   Constipation- Continue bowel regimen medications

## 2013-03-21 ENCOUNTER — Non-Acute Institutional Stay (SKILLED_NURSING_FACILITY): Payer: Medicare Other | Admitting: Internal Medicine

## 2013-03-21 DIAGNOSIS — J209 Acute bronchitis, unspecified: Secondary | ICD-10-CM

## 2013-03-21 NOTE — Progress Notes (Signed)
Patient ID: Melanie PiperHelen M Ringenberg, female   DOB: 1923/09/20, 80 y.o.   MRN: [id number]  [facility] Provider:  Gwenith Spitziffany L. Renato Gailseed, D.O., C.M.D.  Code Status:  Full code   Chief Complaint  Patient presents with  . Acute Visit    wheezing, productive cough, hypoxia to 91%    HPI:  80 yo female with dementia, b12 deficiency, chronic pain, constipation and asthma seen for acute visit due to wheezing, productive cough and hypoxia to 91% on RA.     Review of Systems:  Review of Systems  Constitutional: Positive for malaise/fatigue. Negative for fever.  HENT: Positive for congestion. Negative for ear pain and sore throat.   Respiratory: Positive for cough, sputum production, shortness of breath and wheezing. Negative for hemoptysis.   Cardiovascular: Negative for chest pain and leg swelling.  Gastrointestinal: Negative for abdominal pain.  Genitourinary: Negative for dysuria.  Musculoskeletal: Negative for falls and myalgias.  Skin: Negative for rash.  Neurological: Negative for dizziness and headaches.  Endo/Heme/Allergies: Bruises/bleeds easily.  Psychiatric/Behavioral: Positive for memory loss.    Medications: Patient's Medications  New Prescriptions   No medications on file  Previous Medications   CYANOCOBALAMIN 500 MCG TABLET    Take 500 mcg by mouth daily.   DULOXETINE (CYMBALTA) 30 MG CAPSULE    Take 30 mg by mouth daily.   FUROSEMIDE (LASIX) 40 MG TABLET    Take 40 mg by mouth daily.   GUAIFENESIN (ROBITUSSIN) 100 MG/5ML SOLN    Take 5 mLs by mouth every 6 (six) hours as needed (cough).   IPRATROPIUM-ALBUTEROL (DUONEB) 0.5-2.5 (3) MG/3ML SOLN    Take 3 mLs by nebulization daily as needed (wheezing).   LACTULOSE (CHRONULAC) 10 GM/15ML SOLUTION    Take 30 g by mouth every morning.    LEVOTHYROXINE (SYNTHROID, LEVOTHROID) 100 MCG TABLET    Take 100 mcg by mouth daily.   MULTIPLE VITAMINS-MINERALS (MULTIVITAMIN WITH MINERALS) TABLET    Take 1 tablet by mouth  daily.   POLYETHYLENE GLYCOL POWDER (MIRALAX) POWDER    Take 17 g by mouth daily. For constipation. Dispense 1 month supply.    POLYVINYL ALCOHOL-POVIDONE (HYPOTEARS) 1.4-0.6 % OPHTHALMIC SOLUTION    Place 1 drop into both eyes 3 (three) times daily.   POTASSIUM CHLORIDE SA (K-DUR,KLOR-CON) 20 MEQ TABLET    Take 20 mEq by mouth daily.   SENNA (SENOKOT) 8.6 MG TABLET    Take 2 tablets by mouth 2 (two) times daily.   Modified Medications   No medications on file  Discontinued Medications   No medications on file    Physical Exam: Filed Vitals:   03/21/13 1445  BP: 130/70  Pulse: 76  Temp: 99.3 F (37.4 C)  Resp: 16  Height: 5\' 6"  (1.676 m)  Weight: 158 lb (71.668 kg)   Physical Exam  Constitutional: No distress.  Cardiovascular: Normal rate, regular rhythm, normal heart sounds and intact distal pulses.   Pulmonary/Chest: Effort normal.  Right greater than left sided wheezes and rhonchi present  Neurological: She is alert.  Pleasantly confused  Skin: Skin is warm and dry.  Psychiatric: She has a normal mood and affect.   Assessment/Plan 1. Acute bronchitis -levaquin, CXR, VS q shift wa x 72 hrs, duonebs, cbc, bmp, push po fluids  Family/ staff Communication: seen with unit supervisor  Goals of care: full code, long term care resident  Labs/tests ordered:  Cbc, bmp, cxr

## 2013-04-06 ENCOUNTER — Non-Acute Institutional Stay (SKILLED_NURSING_FACILITY): Payer: Medicare Other | Admitting: Internal Medicine

## 2013-04-06 DIAGNOSIS — R5383 Other fatigue: Secondary | ICD-10-CM

## 2013-04-06 DIAGNOSIS — I499 Cardiac arrhythmia, unspecified: Secondary | ICD-10-CM

## 2013-04-06 DIAGNOSIS — R7981 Abnormal blood-gas level: Secondary | ICD-10-CM

## 2013-04-06 DIAGNOSIS — R5381 Other malaise: Secondary | ICD-10-CM

## 2013-04-06 LAB — BASIC METABOLIC PANEL: Glucose: 105 mg/dL

## 2013-04-06 NOTE — Progress Notes (Signed)
Patient ID: Melanie PiperHelen M Roberson, female   DOB: 1923-07-15, 80 y.o.   MRN: [id number]    [facility]  Chief Complaint  Patient presents with  . Acute Visit    lethargiy, poor po intake, cough, drop in o2 sat   Allergies  Allergen Reactions  . Erythromycin   . Penicillins   . Sulfa Antibiotics    HPI 80 y/o female pt is seen for acute visit. She was being treated for URI and completed her rx of zpack yesterday. She was found to be lethargic this am. She has cough and sounds congested. Has been afebrile and has dementia at baseline. No acute behavioral changes reported. Her oral intake has decreased. She was able to eat only 5-10% of her breakfast this am. Her o2 sat was at 86% on room air this am. She was put on o2 and is now on 5 l with o2 sat at 92%. She appears lethargic but is in no acute distress. She is oriented to person. Denies any chest pain or trouble breathing at present. She has cough during her history taking  ROS No chest pain or dyspnea No fever or chills Poor po intake No bowel or bladder complaints Denies dizziness or headache or abdominal pain or new onset weakness  No past medical history on file. Medication reviewed. See [facility]  Physical exam BP 120/68  Pulse 60  Temp(Src) 96.9 F (36.1 C)  Resp 16  SpO2 86%  General- elderly female in no acute distress Head- atraumatic, normocephalic Eyes- PERRLA, EOMI, no pallor, no icterus, no discharge Neck- no lymphadenopathy Cardiovascular- irregular heart rate, controlled rate, no murmurs, trace edema Respiratory- bilateral decreased air entry, wheeze and rhonchi present, no crackles  Abdomen- bowel sounds present, soft, non tender Musculoskeletal- able to move all 4 extremities Neurological- no focal deficit Psychiatry- lethargic, oriented to person  Assessment/plan  1. Lethargy New onset. Recent completion of azithromycin treatment. Given her chest congestion, productive cough and decreased air entry  on exam, concerns for CAP vs aspiration pneumonitis. Will have her lab work done- cbc with diff and cmp. Will get chest xray to rule out consolidation/ infiltrates vs congestion with hx of chf. Will start her on levofloxacin 500 mg daily for 1 week with clindamycin 600 mg three times a day for a week to help cover for pseudomonas and anaerobes as well. Monitor temperature curve. Continue o2 by nsala canula  2. Irregular heart rate No known hx of afib/ flutter. Concern for recent infection putting her in afib. Will get ekg to assess for afib/ flutter and rule out AV block  3. Low O2 saturation Likely from VQ mismatch in setting of pneumonia. Will rule out chf exacerbation. Check BNP. Continue o2 by nasal canula to keep o2 sat > 92% at rest  Spent more than 30 minutes in patient care

## 2013-06-09 ENCOUNTER — Non-Acute Institutional Stay (SKILLED_NURSING_FACILITY): Payer: Medicare Other | Admitting: Internal Medicine

## 2013-06-09 DIAGNOSIS — F0393 Unspecified dementia, unspecified severity, with mood disturbance: Secondary | ICD-10-CM

## 2013-06-09 DIAGNOSIS — K5900 Constipation, unspecified: Secondary | ICD-10-CM

## 2013-06-09 DIAGNOSIS — F3289 Other specified depressive episodes: Secondary | ICD-10-CM

## 2013-06-09 DIAGNOSIS — E039 Hypothyroidism, unspecified: Secondary | ICD-10-CM

## 2013-06-09 DIAGNOSIS — F329 Major depressive disorder, single episode, unspecified: Secondary | ICD-10-CM

## 2013-06-09 DIAGNOSIS — F0280 Dementia in other diseases classified elsewhere without behavioral disturbance: Secondary | ICD-10-CM

## 2013-06-11 ENCOUNTER — Encounter: Payer: Self-pay | Admitting: Internal Medicine

## 2013-06-11 DIAGNOSIS — F0393 Unspecified dementia, unspecified severity, with mood disturbance: Secondary | ICD-10-CM | POA: Insufficient documentation

## 2013-06-11 DIAGNOSIS — F0280 Dementia in other diseases classified elsewhere without behavioral disturbance: Secondary | ICD-10-CM | POA: Insufficient documentation

## 2013-06-11 DIAGNOSIS — F329 Major depressive disorder, single episode, unspecified: Principal | ICD-10-CM

## 2013-06-11 NOTE — Progress Notes (Signed)
Patient ID: Melanie PiperHelen M Roberson, female   DOB: 1923-11-03, 80 y.o.   MRN: [id number]    [facility] [place]  Chief Complaint  Patient presents with  . Medical Managment of Chronic Issues   Allergies  Allergen Reactions  . Erythromycin   . Penicillins   . Sulfa Antibiotics    HPI:   80 y/o female patient is seen today for routine visit. She denies any complaints. She has hx of dementia,  Depression, CHF, schizophrenia. No new concern from staff.    Review of Systems:  Constitutional: Negative for fever.  HENT: Negative for congestion.   Eyes: Negative for blurred vision.  Respiratory: Negative for shortness of breath.   Cardiovascular: Negative for chest pain.  Gastrointestinal: Negative for constipation.  Genitourinary: Negative for dysuria.  Musculoskeletal: Negative for falls.  Skin: Negative for rash.  Neurological: Negative for weakness.  Psychiatric/Behavioral: Positive for memory loss  Reviewed past medical history, past surgical history and medications  Physical exam BP 140/64  Pulse 72  Temp(Src) 97 F (36.1 C)  Resp 18  Wt 149 lb (67.586 kg)  SpO2 93%  Constitutional: elderly female patient in no acute distress.  HENT:   Head: Normocephalic and atraumatic.  Cardiovascular: Normal rate, regular rhythm, normal heart sounds and intact distal pulses.   Pulmonary/Chest: Effort normal and breath sounds normal. No respiratory distress.  Abdominal: Soft. Bowel sounds are normal. She exhibits no distension and no mass. There is no tenderness.  Musculoskeletal: ambulates on wheelchair, has leg edema and has ted hose Neurological: She is alert.   Labs- 04/06/13 wbc 4.6, hb 11.7, hct 33.2, plt 151, na 142, k 3.8, bun 20, cr 0.92, glu 105  Assessment/Plan  Depression Continue cymbalta 30 mg daily  Hypothyroidism Continue levothyroxine 100 mcg daily and monitor tsh  Constipation Stable, continue lactulose daily with prn miralax. Monitor bowel movement

## 2013-07-13 ENCOUNTER — Encounter: Payer: Self-pay | Admitting: Internal Medicine

## 2013-07-13 ENCOUNTER — Non-Acute Institutional Stay: Payer: Medicare Other | Admitting: Internal Medicine

## 2013-07-13 ENCOUNTER — Non-Acute Institutional Stay (SKILLED_NURSING_FACILITY): Payer: Medicare Other | Admitting: Internal Medicine

## 2013-07-13 DIAGNOSIS — E039 Hypothyroidism, unspecified: Secondary | ICD-10-CM

## 2013-07-13 DIAGNOSIS — E507 Other ocular manifestations of vitamin A deficiency: Secondary | ICD-10-CM

## 2013-07-13 DIAGNOSIS — F0150 Vascular dementia without behavioral disturbance: Secondary | ICD-10-CM

## 2013-07-13 DIAGNOSIS — E538 Deficiency of other specified B group vitamins: Secondary | ICD-10-CM

## 2013-07-13 DIAGNOSIS — K5909 Other constipation: Secondary | ICD-10-CM

## 2013-07-13 DIAGNOSIS — F418 Other specified anxiety disorders: Secondary | ICD-10-CM

## 2013-07-13 DIAGNOSIS — H11149 Conjunctival xerosis, unspecified, unspecified eye: Secondary | ICD-10-CM

## 2013-07-13 DIAGNOSIS — K5900 Constipation, unspecified: Secondary | ICD-10-CM

## 2013-07-13 DIAGNOSIS — F341 Dysthymic disorder: Secondary | ICD-10-CM

## 2013-07-13 NOTE — Progress Notes (Signed)
Patient ID: Melanie PiperHelen M Suk, female   DOB: 12/12/1923, 80 y.o.   MRN: [id number]    [name]  Chief Complaint  Patient presents with  . Medical Management of Chronic Issues    refusing care at times, occassional confusion   Allergies  Allergen Reactions  . Erythromycin   . Penicillins   . Sulfa Antibiotics    Code- full code  HPI:   80 y/o female patient is seen today for routine visit. She has been refusing care at times and family have noticed increased anxiety. No concern from staff otherwise. She has dementia schizophrenia, depression, chf. She denies any complaints.   Review of Systems:  Constitutional: Negative for fever.  HENT: Negative for congestion.   Eyes: Negative for blurred vision.  Respiratory: Negative for shortness of breath.   Cardiovascular: Negative for chest pain.  Gastrointestinal: Negative for constipation.  Genitourinary: Negative for dysuria.  Musculoskeletal: Negative for falls.  Skin: Negative for rash.  Neurological: Positive for tremors. Negative for weakness.  Endo/Heme/Allergies: easy bruising  Psychiatric/Behavioral: Positive for memory loss and hallucinations  No past medical history on file.'Medication reviewed. See [facility]  Physical exam BP 128/70  Pulse 84  Temp(Src) 97 F (36.1 C)  Resp 18  Ht 5\' 6"  (1.676 m)  Wt 147 lb (66.679 kg)  BMI 23.74 kg/m2  SpO2 93%  Constitutional: elderly female in no distress.  HENT:   Head: Normocephalic and atraumatic.  Cardiovascular: Normal rate, regular rhythm, normal heart sounds and intact distal pulses.   Pulmonary/Chest: Effort normal and breath sounds normal. No respiratory distress.  Abdominal: Soft. Bowel sounds are normal. She exhibits no distension and no mass. There is no tenderness.  Musculoskeletal: Normal range of motion. Rolls herself around facility in wheelchair  Neurological: She is alert.  Skin: dry and warm  Labs- 01/16/11 tsh 2.640 04/06/13 wbc 4.6, hb 11.7,  hct 33.2, plt 151, na 142, k 3.8, bun 20, cr 0.92, glu 105   Assessment/Plan  Depression with anxiety Continue cymbalta 30 mg daily  Xerophthalmia Continue freshkote  b12 def Continue b12 supplement, monitor bmp  Dementia bp remains stable. Dementia persists with occassional confusion and refusing care. This is anticipated. Overall pt is calm and pleasant during her visit at present  Constipation continue miralax, senna and lactulose for now. Stable  Unspecified hypothyroidism Continue synthroid 100mcg daily   Family/ staff Communication: seen with unit supervisor  Labs- b12, tsh in 1 month

## 2013-07-13 NOTE — Progress Notes (Signed)
Patient ID: Melanie PiperHelen M Roberson, female   DOB: [birth year], 80 y.o.   MRN: [id number]  [facility] [place] Provider:  Gwenith Spitziffany L. Renato Gailseed, D.O., C.M.D.  Code Status:  Full code  Chief Complaint  Patient presents with  . Medical Management of Chronic Issues    HPI:  80 yo white female with h/o vascular dementia, schizophrenia, CHF, HTN, constipation, hypothyroidism and essential tremor was seen for medical mgt of chronic diseases.  She was treated with rocephin for a UTI one month ago, but is doing well now.  She has chronic intermittent periods of confusion and often is packing up her things thinking family is taking her somewhere else.  Review of Systems:  Review of Systems  Constitutional: Negative for fever.  Eyes: Negative for blurred vision.  Respiratory: Negative for shortness of breath.   Cardiovascular: Negative for chest pain.  Gastrointestinal: Negative for abdominal pain.  Genitourinary: Negative for dysuria.  Musculoskeletal: Negative for falls and myalgias.  Skin: Negative for rash.  Neurological: Negative for dizziness, loss of consciousness and headaches.  Endo/Heme/Allergies: Bruises/bleeds easily.       Frequent skin tears  Psychiatric/Behavioral: Positive for memory loss. Negative for depression.    Medications: Patient's Medications  New Prescriptions   No medications on file  Previous Medications   CYANOCOBALAMIN 500 MCG TABLET    Take 500 mcg by mouth daily.   DULOXETINE (CYMBALTA) 30 MG CAPSULE    Take 30 mg by mouth daily.   FUROSEMIDE (LASIX) 40 MG TABLET    Take 40 mg by mouth daily.   GUAIFENESIN (ROBITUSSIN) 100 MG/5ML SOLN    Take 5 mLs by mouth every 6 (six) hours as needed (cough).   IPRATROPIUM-ALBUTEROL (DUONEB) 0.5-2.5 (3) MG/3ML SOLN    Take 3 mLs by nebulization daily as needed (wheezing).   LACTULOSE (CHRONULAC) 10 GM/15ML SOLUTION    Take 30 g by mouth every morning.    LEVOTHYROXINE (SYNTHROID, LEVOTHROID) 100 MCG TABLET    Take  100 mcg by mouth daily.   MULTIPLE VITAMINS-MINERALS (MULTIVITAMIN WITH MINERALS) TABLET    Take 1 tablet by mouth daily.   POLYETHYLENE GLYCOL POWDER (MIRALAX) POWDER    Take 17 g by mouth daily. For constipation. Dispense 1 month supply.    POLYVINYL ALCOHOL-POVIDONE (HYPOTEARS) 1.4-0.6 % OPHTHALMIC SOLUTION    Place 1 drop into both eyes 3 (three) times daily.   POTASSIUM CHLORIDE SA (K-DUR,KLOR-CON) 20 MEQ TABLET    Take 20 mEq by mouth daily.   SENNA (SENOKOT) 8.6 MG TABLET    Take 2 tablets by mouth 2 (two) times daily.   Modified Medications   No medications on file  Discontinued Medications   No medications on file    Physical Exam: Filed Vitals:   11/29/12 1745  BP: 126/54  Pulse: 66  Temp: 98.1 F (36.7 C)  Resp: 20  Height: 5\' 6"  (1.676 m)  Weight: 157 lb (71.215 kg)  SpO2: 96%  Physical Exam  Constitutional:  Frail white female seated in her wheelchair, very pleasantly confused  Cardiovascular: Intact distal pulses.   Irregularly irregular  Pulmonary/Chest: Effort normal and breath sounds normal. No respiratory distress.  Abdominal: Soft. Bowel sounds are normal. She exhibits no distension and no mass. There is no tenderness.  Musculoskeletal: Normal range of motion.  Neurological: She is alert.  Oriented to person only  Skin: Skin is warm and dry.  Psychiatric: She has a normal mood and affect.    Labs reviewed: 10/31/12:  Wbc 4.8, h/h 10.7/31.8, plts 150, Na 143, K 3.9, BUN 17, cr 0.72, glucose 76, TSH 0.456  Assessment/Plan 1. Vascular dementia -late stages, but remains verbal, is dependent in bathing, dressing, grooming, transfers, but able to feed herself -has oscillating awareness -not on dementia meds any longer  2. Unspecified hypothyroidism -cont synthroid, last tsh nl  3. Chronic constipation -cont lactulose daily and miralax daily plus senokot bid  4. B12 deficiency -cont b12 pill, recheck at future lab date  5. Depression with  anxiety -mood good today, cont cymbalta for this and pain from arthritis  6. Xerophthalmia -cont eye drops as needed   Family/ staff Communication: seen with unit supervisor  Goals of care: full code  Labs/tests ordered:  None needed at present

## 2013-07-14 ENCOUNTER — Encounter: Payer: Self-pay | Admitting: Adult Health

## 2013-08-04 ENCOUNTER — Encounter: Payer: Self-pay | Admitting: Internal Medicine

## 2013-08-10 ENCOUNTER — Non-Acute Institutional Stay (SKILLED_NURSING_FACILITY): Payer: Medicare Other | Admitting: Internal Medicine

## 2013-08-10 DIAGNOSIS — F0151 Vascular dementia with behavioral disturbance: Secondary | ICD-10-CM

## 2013-08-10 DIAGNOSIS — F0280 Dementia in other diseases classified elsewhere without behavioral disturbance: Secondary | ICD-10-CM

## 2013-08-10 DIAGNOSIS — F01518 Vascular dementia, unspecified severity, with other behavioral disturbance: Secondary | ICD-10-CM

## 2013-08-10 DIAGNOSIS — F329 Major depressive disorder, single episode, unspecified: Secondary | ICD-10-CM

## 2013-08-10 DIAGNOSIS — F0393 Unspecified dementia, unspecified severity, with mood disturbance: Secondary | ICD-10-CM

## 2013-08-10 DIAGNOSIS — F3289 Other specified depressive episodes: Secondary | ICD-10-CM

## 2013-08-10 DIAGNOSIS — E039 Hypothyroidism, unspecified: Secondary | ICD-10-CM

## 2013-08-10 DIAGNOSIS — G4700 Insomnia, unspecified: Secondary | ICD-10-CM

## 2013-08-10 DIAGNOSIS — F0150 Vascular dementia without behavioral disturbance: Secondary | ICD-10-CM

## 2013-08-11 DIAGNOSIS — G4700 Insomnia, unspecified: Secondary | ICD-10-CM

## 2013-08-11 DIAGNOSIS — F0151 Vascular dementia with behavioral disturbance: Secondary | ICD-10-CM | POA: Insufficient documentation

## 2013-08-11 DIAGNOSIS — E039 Hypothyroidism, unspecified: Secondary | ICD-10-CM | POA: Insufficient documentation

## 2013-08-11 DIAGNOSIS — F01518 Vascular dementia, unspecified severity, with other behavioral disturbance: Secondary | ICD-10-CM | POA: Insufficient documentation

## 2013-08-11 HISTORY — DX: Insomnia, unspecified: G47.00

## 2013-08-11 NOTE — Progress Notes (Signed)
Patient ID: Melanie Roberson, female   DOB: [birth year], 80 y.o.   MRN: [id number]    [facility] [place]  Chief Complaint  Patient presents with  . Medical Management of Chronic Issues    probelm with sleep   Allergies  Allergen Reactions  . Erythromycin   . Penicillins   . Sulfa Antibiotics    HPI 80 yo white female patient is seen for routine visit. She has vascular progressive dementia and schizophrenia which makes HPI and ROS difficult to obtain. As per staff, patient is having problem with sleep at night. She is waiting for her son to come and take her home today (she frequently does this)  Review of Systems  Constitutional: Negative for fever.  Eyes: Negative for blurred vision.  Respiratory: Negative for shortness of breath.   Cardiovascular: Negative for chest pain.  Gastrointestinal: Negative for abdominal pain.  Genitourinary: Negative for dysuria.  Musculoskeletal: Negative for falls and myalgias.  Skin: Negative for rash.  Neurological: Negative for dizziness, loss of consciousness and headaches.  Psychiatric/Behavioral: Positive for memory loss. Negative for depression.   No past medical history on file.  Medication reviewed. [facility]  Physical exam BP 122/70  Pulse 78  Temp(Src) 97.1 F (36.2 C)  Resp 18  Ht 5\' 6"  (1.676 m)  Wt 144 lb (65.318 kg)  BMI 23.25 kg/m2  SpO2 93%  Constitutional:  Frail, in no distress Cardiovascular: Intact distal pulses. irregularly irregular heart rate Pulmonary/Chest: Effort normal and breath sounds normal. No respiratory distress.  Abdominal: Soft. Bowel sounds are normal. She exhibits no distension and no mass. There is no tenderness.  Musculoskeletal: Normal range of motion.  Neurological: She is alert. Oriented to person only  Skin: Skin is warm and dry.  Psychiatric: She has a normal mood and affect. Pleasantly confused  Labs reviewed: 10/31/12:  Wbc 4.8, h/h 10.7/31.8, plts 150, Na 143, K 3.9,  BUN 17, cr 0.72, glucose 76, TSH 0.456 04/06/13 wbc 4.6, hb 11.7, hct 33.2, plt 151, na 142, k 3.8, bun 20, cr 0.92, glu 105 07/14/13 tsh 3.413, b12 465   Assessment/Plan  Insomnia Will increase her seroquel to 25 mg daily and reassess  Hypothyroidism Continue levothyroxine 100 mcg daily. reviewed thyroid level  Vascular dementia Calm this visit. bp controlled.monitor clinically. Monitor weight, skin care and nutritional intake. Off all dementia medications  Depression with anxiety Continue cymbalta 30 mg daily

## 2013-09-08 ENCOUNTER — Non-Acute Institutional Stay (SKILLED_NURSING_FACILITY): Payer: Medicare Other | Admitting: Internal Medicine

## 2013-09-08 DIAGNOSIS — K5900 Constipation, unspecified: Secondary | ICD-10-CM

## 2013-09-08 DIAGNOSIS — F0151 Vascular dementia with behavioral disturbance: Secondary | ICD-10-CM

## 2013-09-08 DIAGNOSIS — F0150 Vascular dementia without behavioral disturbance: Secondary | ICD-10-CM

## 2013-09-08 DIAGNOSIS — E039 Hypothyroidism, unspecified: Secondary | ICD-10-CM

## 2013-09-08 DIAGNOSIS — F329 Major depressive disorder, single episode, unspecified: Principal | ICD-10-CM

## 2013-09-08 DIAGNOSIS — F0153 Vascular dementia, unspecified severity, with mood disturbance: Secondary | ICD-10-CM

## 2013-09-08 NOTE — Progress Notes (Signed)
Patient ID: Melanie PiperHelen M Roberson, female   DOB: [birth year], 80 y.o.   MRN: [id number]    [facility] [place]  Chief Complaint  Patient presents with  . Medical Management of Chronic Issues    rv   Allergies  Allergen Reactions  . Erythromycin   . Penicillins   . Sulfa Antibiotics    HPI 80 yo white female patient is seen for routine visit. She has been at her baseline with confusion due to dementia. Her behavior has been calm. No falls reported. No new skin concerns. Difficult to obtain ROS from patient with her dementia.   Review of Systems   Constitutional: Negative for fever. Waiting for her son to take her home (has done this before) Eyes: Negative for blurred vision.   Respiratory: Negative for shortness of breath.    Cardiovascular: Negative for chest pain.   Gastrointestinal: Negative for abdominal pain.   Genitourinary: Negative for dysuria.   Musculoskeletal: Negative for falls and myalgias.   Psychiatric/Behavioral: Positive for memory loss. Negative for depression.   Current Outpatient Prescriptions on File Prior to Visit  Medication Sig Dispense Refill  . cyanocobalamin 500 MCG tablet Take 500 mcg by mouth daily. For B-12 deficiency      . DULoxetine (CYMBALTA) 30 MG capsule Take 30 mg by mouth daily. For anxiety      . furosemide (LASIX) 40 MG tablet Take 40 mg by mouth daily.      Marland Kitchen. guaiFENesin (ROBITUSSIN) 100 MG/5ML SOLN Take 10 mLs by mouth every 6 (six) hours as needed (cough).       Marland Kitchen. ipratropium-albuterol (DUONEB) 0.5-2.5 (3) MG/3ML SOLN Take 3 mLs by nebulization daily as needed (wheezing).      Marland Kitchen. lactulose (CHRONULAC) 10 GM/15ML solution Take 30 g by mouth every morning.       Marland Kitchen. levothyroxine (SYNTHROID, LEVOTHROID) 100 MCG tablet Take 100 mcg by mouth daily. For hypothyroidism      . Multiple Vitamins-Minerals (MULTIVITAMIN WITH MINERALS) tablet Take 1 tablet by mouth daily.      . polyethylene glycol powder (MIRALAX) powder Take 15 g by  mouth daily. For constipation. Dispense 1 month supply.      . polyvinyl alcohol-povidone (HYPOTEARS) 1.4-0.6 % ophthalmic solution Place 1 drop into both eyes 3 (three) times daily. For dry eyes      . potassium chloride SA (K-DUR,KLOR-CON) 20 MEQ tablet Take 20 mEq by mouth daily. For heart disease      . senna (SENOKOT) 8.6 MG tablet Take 2 tablets by mouth 2 (two) times daily. For constipation       No current facility-administered medications on file prior to visit.     Physical exam BP 137/56  Pulse 62  Temp(Src) 97.4 F (36.3 C)  Resp 18  Ht 5\' 6"  (1.676 m)  Wt 147 lb (66.679 kg)  BMI 23.74 kg/m2  SpO2 93%  Constitutional:  Frail, in no distress Cardiovascular: Intact distal pulses. irregularly irregular heart rate Pulmonary/Chest: Effort normal and breath sounds normal. No respiratory distress.   Abdominal: Soft. Bowel sounds are normal. She exhibits no distension and no mass. There is no tenderness.  Musculoskeletal: Normal range of motion.  Neurological: She is alert. Oriented to person only   Skin: Skin is warm and dry.  Psychiatric: She has a normal mood and affect. Pleasantly confused  Labs reviewed: 10/31/12:  Wbc 4.8, h/h 10.7/31.8, plts 150, Na 143, K 3.9, BUN 17, cr 0.72, glucose 76, TSH 0.456 04/06/13 wbc  4.6, hb 11.7, hct 33.2, plt 151, na 142, k 3.8, bun 20, cr 0.92, glu 105 07/14/13 tsh 3.413, b12 465   Assessment/Plan  Depression with dementia Continue her seroquel for now with cymbalta and monitor clinically. Decline anticipated  Hypothyroidism Continue levothyroxine 100 mcg daily. reviewed thyroid level  Constipation Her thyroid problem could be contributing some to this. Continue senna and miralax current regimen, have been helpful

## 2013-10-01 DIAGNOSIS — F0153 Vascular dementia, unspecified severity, with mood disturbance: Secondary | ICD-10-CM | POA: Insufficient documentation

## 2013-10-01 DIAGNOSIS — F0150 Vascular dementia without behavioral disturbance: Secondary | ICD-10-CM | POA: Insufficient documentation

## 2013-10-01 DIAGNOSIS — F329 Major depressive disorder, single episode, unspecified: Principal | ICD-10-CM

## 2013-11-02 NOTE — Addendum Note (Signed)
Addended by: Kermit Balo on: 11/02/2013 06:02 PM   Modules accepted: Level of Service, SmartSet

## 2013-11-02 NOTE — Progress Notes (Signed)
This encounter was created in error - please disregard.

## 2013-11-10 ENCOUNTER — Non-Acute Institutional Stay (SKILLED_NURSING_FACILITY): Payer: Medicare Other | Admitting: Internal Medicine

## 2013-11-10 DIAGNOSIS — S81009A Unspecified open wound, unspecified knee, initial encounter: Secondary | ICD-10-CM

## 2013-11-10 DIAGNOSIS — S91009A Unspecified open wound, unspecified ankle, initial encounter: Secondary | ICD-10-CM

## 2013-11-10 DIAGNOSIS — S81809A Unspecified open wound, unspecified lower leg, initial encounter: Secondary | ICD-10-CM

## 2013-11-10 DIAGNOSIS — S81811A Laceration without foreign body, right lower leg, initial encounter: Secondary | ICD-10-CM

## 2013-11-20 ENCOUNTER — Encounter: Payer: Self-pay | Admitting: Internal Medicine

## 2013-11-20 NOTE — Progress Notes (Signed)
Patient ID: Melanie Roberson, female   DOB: 02/02/1924, 80 y.o.   MRN: [id number]  [facility] Provider:  Adden Strout L. Daleen Steinhaus, D.O., C.M.D.  Code Status:  Full code  Chief Complaint  Patient presents with  . Medical Management of Chronic Issues    HPI:  80 yo white female with h/o vascular dementia, schizophrenia, CHF, HTN, constipation, hypothyroidism and essential tremor was seen for medical mgt of chronic diseases.  She was treated with rocephin for a UTI one month ago, but is doing well now.  She has chronic intermittent periods of confusion and often is packing up her things thinking family is taking her somewhere else.  Review of Systems:  Review of Systems  Constitutional: Negative for fever.  Eyes: Negative for blurred vision.  Respiratory: Negative for shortness of breath.   Cardiovascular: Negative for chest pain.  Gastrointestinal: Negative for abdominal pain.  Genitourinary: Negative for dysuria.  Musculoskeletal: Negative for falls and myalgias.  Skin: Negative for rash.  Neurological: Negative for dizziness, loss of consciousness and headaches.  Endo/Heme/Allergies: Bruises/bleeds easily.       Frequent skin tears  Psychiatric/Behavioral: Positive for memory loss. Negative for depression.    Medications: Patient's Medications  New Prescriptions   No medications on file  Previous Medications   CYANOCOBALAMIN 500 MCG TABLET    Take 500 mcg by mouth daily.   DULOXETINE (CYMBALTA) 30 MG CAPSULE    Take 30 mg by mouth daily.   FUROSEMIDE (LASIX) 40 MG TABLET    Take 40 mg by mouth daily.   GUAIFENESIN (ROBITUSSIN) 100 MG/5ML SOLN    Take 5 mLs by mouth every 6 (six) hours as needed (cough).   IPRATROPIUM-ALBUTEROL (DUONEB) 0.5-2.5 (3) MG/3ML SOLN    Take 3 mLs by nebulization daily as needed (wheezing).   LACTULOSE (CHRONULAC) 10 GM/15ML SOLUTION    Take 30 g by mouth every morning.    LEVOTHYROXINE (SYNTHROID, LEVOTHROID) 100 MCG TABLET    Take  100 mcg by mouth daily.   MULTIPLE VITAMINS-MINERALS (MULTIVITAMIN WITH MINERALS) TABLET    Take 1 tablet by mouth daily.   POLYETHYLENE GLYCOL POWDER (MIRALAX) POWDER    Take 17 g by mouth daily. For constipation. Dispense 1 month supply.    POLYVINYL ALCOHOL-POVIDONE (HYPOTEARS) 1.4-0.6 % OPHTHALMIC SOLUTION    Place 1 drop into both eyes 3 (three) times daily.   POTASSIUM CHLORIDE SA (K-DUR,KLOR-CON) 20 MEQ TABLET    Take 20 mEq by mouth daily.   SENNA (SENOKOT) 8.6 MG TABLET    Take 2 tablets by mouth 2 (two) times daily.   Modified Medications   No medications on file  Discontinued Medications   No medications on file    Physical Exam: Filed Vitals:   11/29/12 1745  BP: 126/54  Pulse: 66  Temp: 98.1 F (36.7 C)  Resp: 20  Height: 5' 6" (1.676 m)  Weight: 157 lb (71.215 kg)  SpO2: 96%  Physical Exam  Constitutional:  Frail white female seated in her wheelchair, very pleasantly confused  Cardiovascular: Intact distal pulses.   Irregularly irregular  Pulmonary/Chest: Effort normal and breath sounds normal. No respiratory distress.  Abdominal: Soft. Bowel sounds are normal. She exhibits no distension and no mass. There is no tenderness.  Musculoskeletal: Normal range of motion.  Neurological: She is alert.  Oriented to person only  Skin: Skin is warm and dry.  Psychiatric: She has a normal mood and affect.    Labs reviewed: 10/31/12:    plts 150, Na 143, K 3.9, BUN 17, cr 0.72, glucose 76, TSH 0.456  Assessment/Plan  1. Vascular dementia  -late stages, but remains verbal, is dependent in bathing, dressing, grooming, transfers, but able to feed herself  -has oscillating awareness  -not on dementia meds any longer  2. Unspecified hypothyroidism  -cont synthroid, last tsh nl  3. Chronic constipation  -cont lactulose daily and miralax daily plus senokot bid  4. B12 deficiency  -cont b12 pill, recheck at future lab date  5. Depression with anxiety  -mood good  today, cont cymbalta for this and pain from arthritis  6. Xerophthalmia  -cont eye drops as needed  Family/ staff Communication: seen with unit supervisor  Goals of care: full code  Labs/tests ordered: None needed at present

## 2013-11-25 NOTE — Progress Notes (Signed)
Patient ID: Melanie Roberson, female   DOB: [birth year], 80 y.o.   MRN: [id number]    Facility: [facility]  Chief Complaint  Patient presents with  . Acute Visit    skin tear with concern for infection   Allergies  Allergen Reactions  . Erythromycin   . Penicillins   . Sulfa Antibiotics    HPI 80 y/o female patient is seen for acute concern. She has skin tear on her right leg ad nursing is concerned about it being infected. She has dementia and is pleasantly confused She denies any leg pain She denies any fever or chills  ROS No falls reported Staff have not noticed any drainage or swelling of the affected limb No acute behavior change  Medication reviewed. See [facility]  Physical exam BP 131/66  Pulse 79  Temp(Src) 97.5 F (36.4 C)  Resp 18  Ht  (1.676 m)  Wt 146 lb (66.225 kg)  BMI 23.58 kg/m2  SpO2 93%  Constitutional: No distress.  HENT:   Head: Normocephalic and atraumatic.  Cardiovascular: Normal rate, regular rhythm, normal heart sounds and intact distal pulses.   Pulmonary/Chest: Effort normal and breath sounds normal. No respiratory distress.  Abdominal: Soft. Bowel sounds are normal.  Musculoskeletal: Normal range of motion. Rolls herself around facility in wheelchair. Right leg has a skin tear with mild erythema, non tender, no drainage, no edema Neurological: She is alert.    Assessment/Plan  Skin tear No signs of infection. Monitor clinically, clean the area with normal saline and apply bactroban with mepore.

## 2013-12-01 ENCOUNTER — Non-Acute Institutional Stay (SKILLED_NURSING_FACILITY): Payer: Medicare Other | Admitting: Internal Medicine

## 2013-12-01 ENCOUNTER — Encounter: Payer: Self-pay | Admitting: Internal Medicine

## 2013-12-01 DIAGNOSIS — K5900 Constipation, unspecified: Secondary | ICD-10-CM | POA: Insufficient documentation

## 2013-12-01 DIAGNOSIS — F329 Major depressive disorder, single episode, unspecified: Secondary | ICD-10-CM

## 2013-12-01 DIAGNOSIS — R609 Edema, unspecified: Secondary | ICD-10-CM

## 2013-12-01 DIAGNOSIS — E876 Hypokalemia: Secondary | ICD-10-CM

## 2013-12-01 DIAGNOSIS — D519 Vitamin B12 deficiency anemia, unspecified: Secondary | ICD-10-CM

## 2013-12-01 DIAGNOSIS — F0280 Dementia in other diseases classified elsewhere without behavioral disturbance: Secondary | ICD-10-CM

## 2013-12-01 DIAGNOSIS — F0393 Unspecified dementia, unspecified severity, with mood disturbance: Secondary | ICD-10-CM

## 2013-12-01 NOTE — Progress Notes (Signed)
Patient ID: Melanie PiperHelen M Roberson, female   DOB: October 12, 1923, 80 y.o.   MRN: [id number]    Facility: [facility] [place]  Chief Complaint  Patient presents with  . Medical Management of Chronic Issues   Allergies  Allergen Reactions  . Erythromycin   . Penicillins   . Sulfa Antibiotics    HPI  80 yo female patient is seen for routine visit. She has been at her baseline with confusion due to dementia. Her behavior has been calm. No falls reported. No new skin concerns. Difficult to obtain ROS from patient with her dementia.   Review of Systems   unable to obtain  History reviewed. No pertinent past medical history.    Medication List       This list is accurate as of: 12/01/13  4:14 PM.  Always use your most recent med list.               cyanocobalamin 500 MCG tablet  Take 500 mcg by mouth daily. For B-12 deficiency     DULoxetine 30 MG capsule  Commonly known as:  CYMBALTA  Take 30 mg by mouth daily. For anxiety     furosemide 40 MG tablet  Commonly known as:  LASIX  Take 40 mg by mouth daily.     guaiFENesin 100 MG/5ML Soln  Commonly known as:  ROBITUSSIN  Take 10 mLs by mouth every 6 (six) hours as needed (cough).     ipratropium-albuterol 0.5-2.5 (3) MG/3ML Soln  Commonly known as:  DUONEB  Take 3 mLs by nebulization daily as needed (wheezing).     lactulose 10 GM/15ML solution  Commonly known as:  CHRONULAC  Take 30 g by mouth every morning.     levothyroxine 100 MCG tablet  Commonly known as:  SYNTHROID, LEVOTHROID  Take 100 mcg by mouth daily. For hypothyroidism     MIRALAX powder  Generic drug:  polyethylene glycol powder  Take 15 g by mouth daily. For constipation. Dispense 1 month supply.     multivitamin with minerals tablet  Take 1 tablet by mouth daily.     polyvinyl alcohol-povidone 1.4-0.6 % ophthalmic solution  Commonly known as:  HYPOTEARS  Place 1 drop into both eyes 3 (three) times daily. For dry eyes     potassium chloride  SA 20 MEQ tablet  Commonly known as:  K-DUR,KLOR-CON  Take 20 mEq by mouth daily. For heart disease     QUEtiapine 12.5 mg Tabs tablet  Commonly known as:  SEROQUEL  Take 12.5 mg by mouth at bedtime. For dementia       Physical exam BP 132/66  Pulse 78  Temp(Src) 97.8 F (36.6 C)  Resp 18  Ht 5\' 6"  (1.676 m)  Wt 146 lb (66.225 kg)  BMI 23.58 kg/m2  SpO2 93%  Constitutional:  Frail, in no distress Cardiovascular: Intact distal pulses. irregularly irregular heart rate Pulmonary/Chest: Effort normal and breath sounds normal. No respiratory distress.   Abdominal: Soft. Bowel sounds are normal. She exhibits no distension and no mass. There is no tenderness.  Musculoskeletal: Normal range of motion. Edema in both legs improved from before but still present, trace, pitting edema Neurological: She is alert. Oriented to person only   Skin: Skin is warm and dry.  Psychiatric: She has a normal mood and affect. Pleasantly confused  Labs reviewed: 10/31/12:  Wbc 4.8, h/h 10.7/31.8, plts 150, Na 143, K 3.9, BUN 17, cr 0.72, glucose 76, TSH 0.456 04/06/13 wbc 4.6, hb  11.7, hct 33.2, plt 151, na 142, k 3.8, bun 20, cr 0.92, glu 105 07/14/13 tsh 3.413, b12 465  Assessment/Plan  b12 deficiency anemia Continue b12 supplement  Edema Stable, decrease lasix to 20 mg daily, monitor clinically, to keep legs elevated at rest   Constipation Stable, on lactulose and miralax  Depression with dementia Continue her cymbalta and monitor clinically. Will take her off seroquel for attempt of GDR  Hypokalemia Continue kcl supplement, check bmp

## 2014-02-08 ENCOUNTER — Non-Acute Institutional Stay (SKILLED_NURSING_FACILITY): Payer: Medicare Other | Admitting: Adult Health

## 2014-02-08 DIAGNOSIS — F01518 Vascular dementia, unspecified severity, with other behavioral disturbance: Secondary | ICD-10-CM

## 2014-02-08 DIAGNOSIS — F329 Major depressive disorder, single episode, unspecified: Secondary | ICD-10-CM

## 2014-02-08 DIAGNOSIS — F0280 Dementia in other diseases classified elsewhere without behavioral disturbance: Secondary | ICD-10-CM

## 2014-02-08 DIAGNOSIS — E876 Hypokalemia: Secondary | ICD-10-CM

## 2014-02-08 DIAGNOSIS — F0393 Unspecified dementia, unspecified severity, with mood disturbance: Secondary | ICD-10-CM

## 2014-02-08 DIAGNOSIS — F209 Schizophrenia, unspecified: Secondary | ICD-10-CM

## 2014-02-08 DIAGNOSIS — I509 Heart failure, unspecified: Secondary | ICD-10-CM

## 2014-02-08 DIAGNOSIS — E039 Hypothyroidism, unspecified: Secondary | ICD-10-CM

## 2014-02-08 DIAGNOSIS — F0151 Vascular dementia with behavioral disturbance: Secondary | ICD-10-CM

## 2014-02-21 LAB — BASIC METABOLIC PANEL
BUN: 20 mg/dL (ref 4–21)
Creatinine: 0.9 mg/dL (ref 0.5–1.1)
Glucose: 85 mg/dL
Potassium: 4 mmol/L (ref 3.4–5.3)
Sodium: 143 mmol/L (ref 137–147)

## 2014-02-21 LAB — CBC AND DIFFERENTIAL
HCT: 33 % — AB (ref 36–46)
Hemoglobin: 11.7 g/dL — AB (ref 12.0–16.0)
PLATELETS: 127 10*3/uL — AB (ref 150–399)
WBC: 3.1 10^3/mL

## 2014-02-21 LAB — TSH: TSH: 1.78 u[IU]/mL (ref 0.41–5.90)

## 2014-02-24 ENCOUNTER — Encounter: Payer: Self-pay | Admitting: Adult Health

## 2014-02-24 NOTE — Progress Notes (Signed)
Patient ID: Melanie Roberson, female   DOB: [birth year], 80 y.o.   MRN: [id number]  [facility]     Allergies  Allergen Reactions  . Erythromycin   . Penicillins   . Sulfa Antibiotics        Chief Complaint  Patient presents with  . Medical Management of Chronic Issues    HPI:  She is a long term resident of this facility being seen for the management of her chronic illnesses. She is unable to participate in the hpi or ros.  She does have worsening edema; is wearing ted hose. There are no concerns being voiced by the nursing staff at this time.      Past Medical History  Diagnosis Date  . MYALGIA 07/13/2007    Qualifier: Diagnosis of  By: Gavin PottersGRANDIS MD, HEIDI    . SCHIZOPHRENIA 04/29/2006    Qualifier: Diagnosis of  By: Erenest RasherVANSTORY MD, MADELEINE    . TREMOR, ESSENTIAL 05/28/2006    Qualifier: Diagnosis of  By: Erenest RasherVANSTORY MD, MADELEINE    . Insomnia 08/11/2013  . Anemia   . Thyroid disease   . Hyperlipidemia   . Hypertension   . Depression   . CHF (congestive heart failure)     No past surgical history on file.    VITAL SIGNS BP 130/70 mmHg  Pulse 71  Ht 5\' 6"  (1.676 m)  Wt 144 lb (65.318 kg)  BMI 23.25 kg/m2  SpO2 93%   Outpatient Encounter Prescriptions as of 02/08/2014  Medication Sig  . cyanocobalamin 500 MCG tablet Take 500 mcg by mouth daily. For B-12 deficiency  . DULoxetine (CYMBALTA) 30 MG capsule Take 30 mg by mouth daily. For anxiety  . furosemide (LASIX) 40 MG tablet Take 20 mg by mouth daily.  Marland Kitchen. guaiFENesin (ROBITUSSIN) 100 MG/5ML SOLN Take 10 mLs by mouth every 6 (six) hours as needed (cough).   Marland Kitchen. ipratropium-albuterol (DUONEB) 0.5-2.5 (3) MG/3ML SOLN Take 3 mLs by nebulization 3 (three) times daily. And daily as needed  . lactulose (CHRONULAC) 10 GM/15ML solution Take 30 g by mouth every morning.   Marland Kitchen. levothyroxine (SYNTHROID, LEVOTHROID) 100 MCG tablet Take 100 mcg by mouth daily. For hypothyroidism  . Multiple Vitamins-Minerals (MULTIVITAMIN WITH  MINERALS) tablet Take 1 tablet by mouth daily.  . polyethylene glycol powder (MIRALAX) powder Take 15 g by mouth daily. For constipation. Dispense 1 month supply.  . polyvinyl alcohol-povidone (HYPOTEARS) 1.4-0.6 % ophthalmic solution Place 1 drop into both eyes 3 (three) times daily. For dry eyes  . potassium chloride SA (K-DUR,KLOR-CON) 20 MEQ tablet Take 20 mEq by mouth daily. For heart disease  . [DISCONTINUED] QUEtiapine (SEROQUEL) 12.5 mg TABS tablet Take 12.5 mg by mouth at bedtime. For dementia     SIGNIFICANT DIAGNOSTIC EXAMS  LABS REVIEWED:   08-11-13: tsh 1.750 12-04-13: glucose 86; bun 22; creat 0.9; k+4.0; na++146      Review of Systems  Unable to perform ROS    Physical Exam  Constitutional: She appears well-developed and well-nourished. No distress.  Neck: Neck supple. No JVD present. No thyromegaly present.  Cardiovascular: Normal rate, regular rhythm and intact distal pulses.   Respiratory: Effort normal and breath sounds normal. No respiratory distress.  GI: Soft. Bowel sounds are normal. She exhibits no distension.  Musculoskeletal: She exhibits edema.  Is able to move all extremities Has 2+ lower extremity edema   Neurological: She is alert.  Skin: Skin is warm and dry. She is not diaphoretic.  ASSESSMENT/ PLAN:  1. Hypothyroidism: will continue synthroid 100 mcg daily; tsh in June 2015 is 1.750. Will monitor; will check tsh  2. CHF: her edema is worse; will increase her lasix to 40 mg daily will monitor her status. Will continue her duoneb three times daily and daily as needed   3. Hypokalemia: will continue k+ 20 meq daily   4. Dementia: no significant change in her status; is not taking medications will not make changes will monitor   5. Schizophrenia: she has been taking off the seroquel; no change in her behaviors; will not make changes will monitor  6. Depression due to dementia: she does receive benefit from cymbalta 30 mg daily will  monitor   7. Constipation: will continue lactulose 30 cc daily and miralax daily    In 2 weeks will check cbc; bmp; tsh   Synthia Innocenteborah Sheriff Rodenberg NP [facility]  Contact [phone] Monday through Friday 8am- 5pm  After hours call [phone]

## 2014-03-27 ENCOUNTER — Encounter: Payer: Self-pay | Admitting: Internal Medicine

## 2014-03-27 ENCOUNTER — Non-Acute Institutional Stay (SKILLED_NURSING_FACILITY): Payer: Medicare Other | Admitting: Internal Medicine

## 2014-03-27 DIAGNOSIS — F418 Other specified anxiety disorders: Secondary | ICD-10-CM

## 2014-03-27 DIAGNOSIS — R609 Edema, unspecified: Secondary | ICD-10-CM

## 2014-03-27 DIAGNOSIS — E039 Hypothyroidism, unspecified: Secondary | ICD-10-CM

## 2014-03-27 DIAGNOSIS — F01518 Vascular dementia, unspecified severity, with other behavioral disturbance: Secondary | ICD-10-CM

## 2014-03-27 DIAGNOSIS — I5032 Chronic diastolic (congestive) heart failure: Secondary | ICD-10-CM

## 2014-03-27 DIAGNOSIS — F209 Schizophrenia, unspecified: Secondary | ICD-10-CM

## 2014-03-27 DIAGNOSIS — F0151 Vascular dementia with behavioral disturbance: Secondary | ICD-10-CM

## 2014-03-27 NOTE — Progress Notes (Signed)
Patient ID: Melanie Roberson, female   DOB: 01-19-1924, 81 y.o.   MRN: [id number]    [facility]     Place of Service: SNF (31)    Allergies  Allergen Reactions  . Erythromycin   . Penicillins   . Sulfa Antibiotics     Chief Complaint  Patient presents with  . Medical Management of Chronic Issues    dementia, schizophrenia, essential tremor, insomnia, anemia, hypothyroidism, hyperlipidemia, HTN, depression, CHF    HPI:  81 yo female long term resident seen today for above. She reports feeling anxious today. She has dementia and unable to obtain further hx. There have been no nursing issues.   CODE STATUS: Full  Medications: Patient's Medications  New Prescriptions   No medications on file  Previous Medications   CYANOCOBALAMIN 500 MCG TABLET    Take 500 mcg by mouth daily. For B-12 deficiency   DULOXETINE (CYMBALTA) 30 MG CAPSULE    Take 30 mg by mouth daily. For anxiety   FUROSEMIDE (LASIX) 40 MG TABLET    Take 40 mg by mouth daily.   GUAIFENESIN (ROBITUSSIN) 100 MG/5ML SOLN    Take 10 mLs by mouth every 6 (six) hours as needed (cough).    IPRATROPIUM-ALBUTEROL (DUONEB) 0.5-2.5 (3) MG/3ML SOLN    Take 3 mLs by nebulization 3 (three) times daily. And daily as needed   LACTULOSE (CHRONULAC) 10 GM/15ML SOLUTION    Take 30 g by mouth every morning.    LEVOTHYROXINE (SYNTHROID, LEVOTHROID) 100 MCG TABLET    Take 100 mcg by mouth daily. For hypothyroidism   MULTIPLE VITAMINS-MINERALS (MULTIVITAMIN WITH MINERALS) TABLET    Take 1 tablet by mouth daily.   POLYETHYLENE GLYCOL POWDER (MIRALAX) POWDER    Take 15 g by mouth daily. For constipation. Dispense 1 month supply.   POLYVINYL ALCOHOL-POVIDONE (HYPOTEARS) 1.4-0.6 % OPHTHALMIC SOLUTION    Place 1 drop into both eyes 3 (three) times daily. For dry eyes   POTASSIUM CHLORIDE SA (K-DUR,KLOR-CON) 20 MEQ TABLET    Take 20 mEq by mouth daily. For heart disease  Modified Medications   No medications on file    Discontinued Medications   No medications on file     Review of Systems Unable to obtain due to pt's mental status  Filed Vitals:   03/27/14 1605  BP: 130/71  Pulse: 78  Temp: 97.7 F (36.5 C)  Weight: 143 lb (64.864 kg)  SpO2: 93%   Body mass index is 23.09 kg/(m^2).  Physical Exam CONSTITUTIONAL: Looks well in NAD. Awake and alert HEENT: PERRLA. Oropharynx clear and without exudate NECK: Supple. Nontender. No palpable cervical or supraclavicular lymph nodes. No carotid bruit b/l.  CVS: Regular rate without murmur, gallop or rub. LUNGS: CTA b/l no wheezing, rales or rhonchi. ABDOMEN: Bowel sounds present x 4. Soft, nontender, nondistended. No palpable mass or bruit EXTREMITIES: L>RLE +1 pitting edema. Distal pulses palpable. No calf tenderness PSYCH: Affect, behavior and mood normal   Labs reviewed:  CBC Latest Ref Rng 02/21/2014 03/04/2009 03/03/2009  WBC - 3.1 5.9 5.9  Hemoglobin 12.0 - 16.0 g/dL 11.7(A) 11.0(L) 12.4  Hematocrit 36 - 46 % 33(A) 31.7(L) 35.5(L)  Platelets 150 - 399 K/L 127(A) 105(L) 134(L)    CMP Latest Ref Rng 02/21/2014 03/04/2009 03/03/2009  Glucose 70 - 99 mg/dL - 98 87  BUN 4 - 21 mg/dL Creatinine 0.5 - 1.1 mg/dL 0.9 4.09 8.11  Sodium 914 - 147 mmol/L 143 140  144  Potassium 3.4 - 5.3 mmol/L 4.0 3.8 3.8  Chloride 96 - 112 mEq/L - 107 108  CO2 19 - 32 mEq/L - 28 30  Calcium 8.4 - 10.5 mg/dL - 9.3 9.5  Total Protein 6.0-8.3 g/dL - - -  Total Bilirubin 0.3-1.2 mg/dL - - -  Alkaline Phos 40-98139-117 units/L - - -  AST 0-37 units/L - - -  ALT 0-35 units/L - - -        Assessment/Plan   ICD-9-CM ICD-10-CM   1. Depression with anxiety - stable 300.4 F41.8   2. Schizophrenia, unspecified type - stable 295.90 F20.9   3. Vascular dementia with behavior disturbance - stable 290.40 F01.51   4. Chronic diastolic CHF (congestive heart failure)- stable; asymptomatic 428.32 I50.32    428.0    5. Hypothyroidism, unspecified hypothyroidism type -  controlled 244.9 E03.9   6. Edema - stable 782.3 R60.9     --she is stable on current tx plan. Continue medications as ordered. Continue nutritional supplement. TED stockings as ordered. Will follow   Keniesha Adderly S. Ancil Linseyarter, D. O., F. A. C. O. I.  [facility] [address] [place], [place] [address] [phone] Office (Wednesdays and Fridays 8 AM - 5 PM) [phone] Cell (Monday-Friday 8 AM - 5 PM)

## 2014-04-09 ENCOUNTER — Non-Acute Institutional Stay (SKILLED_NURSING_FACILITY): Payer: Medicare Other | Admitting: Adult Health

## 2014-04-09 DIAGNOSIS — F0280 Dementia in other diseases classified elsewhere without behavioral disturbance: Secondary | ICD-10-CM | POA: Diagnosis not present

## 2014-04-09 DIAGNOSIS — F0393 Unspecified dementia, unspecified severity, with mood disturbance: Secondary | ICD-10-CM

## 2014-04-09 DIAGNOSIS — F329 Major depressive disorder, single episode, unspecified: Secondary | ICD-10-CM

## 2014-04-16 ENCOUNTER — Emergency Department (HOSPITAL_COMMUNITY): Payer: Medicare Other

## 2014-04-16 ENCOUNTER — Encounter (HOSPITAL_COMMUNITY): Payer: Self-pay | Admitting: *Deleted

## 2014-04-16 ENCOUNTER — Inpatient Hospital Stay (HOSPITAL_COMMUNITY)
Admission: EM | Admit: 2014-04-16 | Discharge: 2014-04-20 | DRG: 602 | Disposition: A | Discharge status: Skilled Nursing Facility | Payer: Medicare Other | Attending: Internal Medicine | Admitting: Internal Medicine

## 2014-04-16 DIAGNOSIS — Z880 Allergy status to penicillin: Secondary | ICD-10-CM

## 2014-04-16 DIAGNOSIS — F0151 Vascular dementia with behavioral disturbance: Secondary | ICD-10-CM | POA: Diagnosis present

## 2014-04-16 DIAGNOSIS — L0390 Cellulitis, unspecified: Secondary | ICD-10-CM | POA: Diagnosis present

## 2014-04-16 DIAGNOSIS — F0393 Unspecified dementia, unspecified severity, with mood disturbance: Secondary | ICD-10-CM | POA: Diagnosis present

## 2014-04-16 DIAGNOSIS — D519 Vitamin B12 deficiency anemia, unspecified: Secondary | ICD-10-CM | POA: Diagnosis present

## 2014-04-16 DIAGNOSIS — L03119 Cellulitis of unspecified part of limb: Secondary | ICD-10-CM

## 2014-04-16 DIAGNOSIS — F209 Schizophrenia, unspecified: Secondary | ICD-10-CM | POA: Diagnosis present

## 2014-04-16 DIAGNOSIS — Z881 Allergy status to other antibiotic agents status: Secondary | ICD-10-CM

## 2014-04-16 DIAGNOSIS — R531 Weakness: Secondary | ICD-10-CM | POA: Diagnosis not present

## 2014-04-16 DIAGNOSIS — L03115 Cellulitis of right lower limb: Secondary | ICD-10-CM | POA: Diagnosis present

## 2014-04-16 DIAGNOSIS — E039 Hypothyroidism, unspecified: Secondary | ICD-10-CM | POA: Diagnosis present

## 2014-04-16 DIAGNOSIS — E860 Dehydration: Secondary | ICD-10-CM | POA: Diagnosis present

## 2014-04-16 DIAGNOSIS — L03116 Cellulitis of left lower limb: Secondary | ICD-10-CM

## 2014-04-16 DIAGNOSIS — E785 Hyperlipidemia, unspecified: Secondary | ICD-10-CM | POA: Diagnosis present

## 2014-04-16 DIAGNOSIS — I10 Essential (primary) hypertension: Secondary | ICD-10-CM | POA: Diagnosis present

## 2014-04-16 DIAGNOSIS — G9340 Encephalopathy, unspecified: Secondary | ICD-10-CM

## 2014-04-16 DIAGNOSIS — G250 Essential tremor: Secondary | ICD-10-CM | POA: Diagnosis present

## 2014-04-16 DIAGNOSIS — Z79899 Other long term (current) drug therapy: Secondary | ICD-10-CM

## 2014-04-16 DIAGNOSIS — N179 Acute kidney failure, unspecified: Secondary | ICD-10-CM | POA: Diagnosis present

## 2014-04-16 DIAGNOSIS — F329 Major depressive disorder, single episode, unspecified: Secondary | ICD-10-CM | POA: Diagnosis present

## 2014-04-16 DIAGNOSIS — G9341 Metabolic encephalopathy: Secondary | ICD-10-CM | POA: Diagnosis present

## 2014-04-16 DIAGNOSIS — I509 Heart failure, unspecified: Secondary | ICD-10-CM | POA: Diagnosis present

## 2014-04-16 DIAGNOSIS — F0280 Dementia in other diseases classified elsewhere without behavioral disturbance: Secondary | ICD-10-CM | POA: Diagnosis present

## 2014-04-16 DIAGNOSIS — I672 Cerebral atherosclerosis: Secondary | ICD-10-CM | POA: Diagnosis present

## 2014-04-16 LAB — BASIC METABOLIC PANEL
ANION GAP: 6 (ref 5–15)
BUN: 28 mg/dL — ABNORMAL HIGH (ref 6–23)
CO2: 32 mmol/L (ref 19–32)
Calcium: 9 mg/dL (ref 8.4–10.5)
Chloride: 105 mmol/L (ref 96–112)
Creatinine, Ser: 1.24 mg/dL — ABNORMAL HIGH (ref 0.50–1.10)
GFR calc non Af Amer: 37 mL/min — ABNORMAL LOW (ref >90)
GFR, EST AFRICAN AMERICAN: 43 mL/min — AB (ref >90)
Glucose, Bld: 175 mg/dL — ABNORMAL HIGH (ref 70–99)
POTASSIUM: 3.6 mmol/L (ref 3.5–5.1)
Sodium: 143 mmol/L (ref 135–145)

## 2014-04-16 LAB — CBC
HEMATOCRIT: 34.6 % — AB (ref 36.0–46.0)
Hemoglobin: 11.3 g/dL — ABNORMAL LOW (ref 12.0–15.0)
MCH: 33.1 pg (ref 26.0–34.0)
MCHC: 32.7 g/dL (ref 30.0–36.0)
MCV: 101.5 fL — ABNORMAL HIGH (ref 78.0–100.0)
Platelets: 105 10*3/uL — ABNORMAL LOW (ref 150–400)
RBC: 3.41 MIL/uL — ABNORMAL LOW (ref 3.87–5.11)
RDW: 13.2 % (ref 11.5–15.5)
WBC: 4 10*3/uL (ref 4.0–10.5)

## 2014-04-16 LAB — URINALYSIS, ROUTINE W REFLEX MICROSCOPIC
BILIRUBIN URINE: NEGATIVE
Glucose, UA: NEGATIVE mg/dL
Hgb urine dipstick: NEGATIVE
KETONES UR: NEGATIVE mg/dL
LEUKOCYTES UA: NEGATIVE
NITRITE: NEGATIVE
Protein, ur: NEGATIVE mg/dL
Specific Gravity, Urine: 1.015 (ref 1.005–1.030)
Urobilinogen, UA: 2 mg/dL — ABNORMAL HIGH (ref 0.0–1.0)
pH: 6.5 (ref 5.0–8.0)

## 2014-04-16 LAB — I-STAT CHEM 8, ED
BUN: 30 mg/dL — AB (ref 6–23)
CALCIUM ION: 1.17 mmol/L (ref 1.13–1.30)
Chloride: 101 mmol/L (ref 96–112)
Creatinine, Ser: 1.2 mg/dL — ABNORMAL HIGH (ref 0.50–1.10)
Glucose, Bld: 171 mg/dL — ABNORMAL HIGH (ref 70–99)
HCT: 34 % — ABNORMAL LOW (ref 36.0–46.0)
Hemoglobin: 11.6 g/dL — ABNORMAL LOW (ref 12.0–15.0)
POTASSIUM: 3.6 mmol/L (ref 3.5–5.1)
Sodium: 144 mmol/L (ref 135–145)
TCO2: 28 mmol/L (ref 0–100)

## 2014-04-16 LAB — I-STAT CG4 LACTIC ACID, ED: LACTIC ACID, VENOUS: 2.21 mmol/L — AB (ref 0.5–2.0)

## 2014-04-16 MED ORDER — DULOXETINE HCL 30 MG PO CPEP
30.0000 mg | ORAL_CAPSULE | Freq: Every day | ORAL | Status: DC
  Administered 2014-04-17 – 2014-04-20 (×4): 30 mg via ORAL
  Filled 2014-04-16 (×4): qty 1

## 2014-04-16 MED ORDER — SODIUM CHLORIDE 0.9 % IV BOLUS (SEPSIS)
1000.0000 mL | Freq: Once | INTRAVENOUS | Status: AC
  Administered 2014-04-16: 1000 mL via INTRAVENOUS

## 2014-04-16 MED ORDER — IPRATROPIUM-ALBUTEROL 0.5-2.5 (3) MG/3ML IN SOLN
3.0000 mL | Freq: Three times a day (TID) | RESPIRATORY_TRACT | Status: DC
  Administered 2014-04-17 – 2014-04-20 (×10): 3 mL via RESPIRATORY_TRACT
  Filled 2014-04-16 (×12): qty 3

## 2014-04-16 MED ORDER — LACTULOSE 10 GM/15ML PO SOLN
30.0000 g | Freq: Every morning | ORAL | Status: DC
  Administered 2014-04-17 – 2014-04-20 (×4): 30 g via ORAL
  Filled 2014-04-16 (×4): qty 45

## 2014-04-16 MED ORDER — HEPARIN SODIUM (PORCINE) 5000 UNIT/ML IJ SOLN
5000.0000 [IU] | Freq: Three times a day (TID) | INTRAMUSCULAR | Status: DC
  Administered 2014-04-17 – 2014-04-18 (×5): 5000 [IU] via SUBCUTANEOUS
  Filled 2014-04-16 (×7): qty 1

## 2014-04-16 MED ORDER — LEVOTHYROXINE SODIUM 100 MCG PO TABS
100.0000 ug | ORAL_TABLET | Freq: Every day | ORAL | Status: DC
  Administered 2014-04-17 – 2014-04-20 (×4): 100 ug via ORAL
  Filled 2014-04-16 (×3): qty 1

## 2014-04-16 MED ORDER — SODIUM CHLORIDE 0.9 % IV SOLN
INTRAVENOUS | Status: DC
  Administered 2014-04-17 – 2014-04-19 (×5): via INTRAVENOUS

## 2014-04-16 MED ORDER — POLYETHYLENE GLYCOL 3350 17 G PO PACK
15.0000 g | PACK | Freq: Every day | ORAL | Status: DC
  Administered 2014-04-17 – 2014-04-20 (×4): 15 g via ORAL
  Filled 2014-04-16 (×4): qty 1

## 2014-04-16 MED ORDER — QUETIAPINE FUMARATE 25 MG PO TABS
25.0000 mg | ORAL_TABLET | Freq: Every day | ORAL | Status: DC
  Administered 2014-04-17 – 2014-04-19 (×3): 25 mg via ORAL
  Filled 2014-04-16 (×6): qty 1

## 2014-04-16 MED ORDER — CLINDAMYCIN PHOSPHATE 600 MG/50ML IV SOLN
600.0000 mg | Freq: Three times a day (TID) | INTRAVENOUS | Status: DC
  Administered 2014-04-17 – 2014-04-20 (×11): 600 mg via INTRAVENOUS
  Filled 2014-04-16 (×14): qty 50

## 2014-04-16 MED ORDER — VANCOMYCIN HCL IN DEXTROSE 1-5 GM/200ML-% IV SOLN
1000.0000 mg | Freq: Once | INTRAVENOUS | Status: AC
  Administered 2014-04-17: 1000 mg via INTRAVENOUS
  Filled 2014-04-16: qty 200

## 2014-04-16 NOTE — ED Notes (Addendum)
[facility] pts family reported that pt is more lethargic than usual. Staff at facility reported that pt is at her baseline and they have seen no increase in weakness. Pt has baseline of dementia and is oriented to self only. Pt denies pain at this time.

## 2014-04-16 NOTE — H&P (Addendum)
Hospitalist Admission History and Physical  Patient name: Melanie PiperHelen M Roberson Medical record number: [id number] Date of birth: [birth year] Age: 81 y.o. Gender: female  Primary Care Provider: Kirt Boysarter, Monica, DO  Chief Complaint: encephalopathy, cellulitis, AKI   History of Present Illness:This is a 81 y.o. year old female with significant past medical history of dementia, schizophrenia, HTN, CHF, hypothyroidism presenting with encephalopath, cellulitis. Level V caveat as pt is acutely confused. No family at bedside. Per report, pt w/ confusion above baseline. More lethargic. Report of family stating that pt had change in baseline. No reported fevers, chills, LOC, falls.  Presented to ER hemodynamically stable, T 99.5. WBC 4.0. Hgb 11.3, Cr 1.24. Glu 175, Lactate 2.2. Head CT WNL. CXR, UA WNL. Concern for LLE cellulitis on exam.     Assessment and Plan: Melanie PiperHelen M Roberson is a 81 y.o. year old female presenting with encephalopathy, cellulitis, AKI    Active Problems:   Encephalopathy   Cellulitis   AKI (acute kidney injury)   1- Encephalopathy -likely multifactorial in setting of baseline dementia and LLE cellulitis -Head CT, CXR, UA WNL  -IV clindamycin -blood cx -trend lactate -f/u ammonia -follow  2-Cellulitis -mild to moderate on exam  -borderline temp  -no focal abscess  -IV clindamycin   3- AKI -likely prerenal in etiology -dry on exam -gently hydrate -LE u/s r/o DVT -follow  4- Schizophrenia -no agitation on exam -cont home regimen  FEN/GI: NPO for now-reasess in am  Prophylaxis: sub q heparin  Disposition: pending further evaluation  Code Status:Full Code    Patient Active Problem List   Diagnosis Date Noted  . Encephalopathy 04/16/2014  . Cellulitis 04/16/2014  . Hypokalemia 12/01/2013  . B12 deficiency anemia 12/01/2013  . Vascular dementia with behavior disturbance 08/11/2013  . Hypothyroidism 08/11/2013  . Depression due to dementia 06/11/2013  .  Vascular dementia, uncomplicated 05/19/2012  . Congestive heart failure 05/19/2012  . Unspecified constipation 05/19/2012  . TREMOR, ESSENTIAL 05/28/2006  . HTN (hypertension), benign 05/28/2006  . Schizophrenia, unspecified type 04/29/2006   Past Medical History: Past Medical History  Diagnosis Date  . MYALGIA 07/13/2007    Qualifier: Diagnosis of  By: Gavin PottersGRANDIS MD, HEIDI    . SCHIZOPHRENIA 04/29/2006    Qualifier: Diagnosis of  By: Erenest RasherVANSTORY MD, MADELEINE    . TREMOR, ESSENTIAL 05/28/2006    Qualifier: Diagnosis of  By: Erenest RasherVANSTORY MD, MADELEINE    . Insomnia 08/11/2013  . Anemia   . Thyroid disease   . Hyperlipidemia   . Hypertension   . Depression   . CHF (congestive heart failure)     Past Surgical History: History reviewed. No pertinent past surgical history.  Social History: History   Social History  . Marital Status: Widowed    Spouse Name: N/A  . Number of Children: N/A  . Years of Education: N/A   Social History Main Topics  . Smoking status: Unknown If Ever Smoked  . Smokeless tobacco: Not on file  . Alcohol Use: Not on file  . Drug Use: Not on file  . Sexual Activity: Not on file   Other Topics Concern  . None   Social History Narrative    Family History: No family history on file.  Allergies: Allergies  Allergen Reactions  . Erythromycin     Per MAR  . Penicillins     Per MAR  . Sulfa Antibiotics     Per MAR    Current Facility-Administered Medications  Medication Dose Route Frequency Provider  Last Rate Last Dose  . 0.9 %  sodium chloride infusion   Intravenous Continuous Doree Albee, MD      . clindamycin (CLEOCIN) IVPB 600 mg  600 mg Intravenous 3 times per day Doree Albee, MD      . Melene Muller ON 04/17/2014] DULoxetine (CYMBALTA) DR capsule 30 mg  30 mg Oral Daily Doree Albee, MD      . heparin injection 5,000 Units  5,000 Units Subcutaneous 3 times per day Doree Albee, MD      . ipratropium-albuterol (DUONEB) 0.5-2.5 (3) MG/3ML nebulizer  solution 3 mL  3 mL Nebulization TID Doree Albee, MD      . Melene Muller ON 04/17/2014] lactulose (CHRONULAC) 10 GM/15ML solution 30 g  30 g Oral q morning - 10a Doree Albee, MD      . Melene Muller ON 04/17/2014] levothyroxine (SYNTHROID, LEVOTHROID) tablet 100 mcg  100 mcg Oral Daily Doree Albee, MD      . Melene Muller ON 04/17/2014] polyethylene glycol powder (GLYCOLAX/MIRALAX) container 15 g  15 g Oral Daily Doree Albee, MD      . Melene Muller ON 04/17/2014] QUEtiapine (SEROQUEL) tablet 25 mg  25 mg Oral QHS Doree Albee, MD      . vancomycin (VANCOCIN) IVPB 1000 mg/200 mL premix  1,000 mg Intravenous Once Colleen Can, [facility]       Current Outpatient Prescriptions  Medication Sig Dispense Refill  . cyanocobalamin 500 MCG tablet Take 500 mcg by mouth daily. For B-12 deficiency    . DULoxetine (CYMBALTA) 30 MG capsule Take 30 mg by mouth daily. For anxiety    . furosemide (LASIX) 40 MG tablet Take 40 mg by mouth daily.    Marland Kitchen guaiFENesin (ROBITUSSIN) 100 MG/5ML SOLN Take 10 mLs by mouth every 6 (six) hours as needed (cough).     Marland Kitchen ipratropium-albuterol (DUONEB) 0.5-2.5 (3) MG/3ML SOLN Take 3 mLs by nebulization 3 (three) times daily. And daily as needed    . lactulose (CHRONULAC) 10 GM/15ML solution Take 30 g by mouth every morning.     Marland Kitchen levothyroxine (SYNTHROID, LEVOTHROID) 100 MCG tablet Take 100 mcg by mouth daily. For hypothyroidism    . Multiple Vitamins-Minerals (MULTIVITAMIN WITH MINERALS) tablet Take 1 tablet by mouth daily.    . polyethylene glycol powder (MIRALAX) powder Take 15 g by mouth daily. For constipation. Dispense 1 month supply.    . polyvinyl alcohol-povidone (HYPOTEARS) 1.4-0.6 % ophthalmic solution Place 1 drop into both eyes 3 (three) times daily. For dry eyes    . potassium chloride SA (K-DUR,KLOR-CON) 20 MEQ tablet Take 20 mEq by mouth daily. For heart disease    . QUEtiapine (SEROQUEL) 25 MG tablet Take 25 mg by mouth at bedtime.     Review Of Systems: 12 point ROS negative except  as noted above in HPI.  Physical Exam: Filed Vitals:   04/16/14 2233  BP: 137/77  Pulse: 67  Temp:   Resp: 18    General: sleepy, arousable, minimally responsive  HEENT: PERRLA, extra ocular movement intact and dry oral mucosa Heart: S1, S2 normal, no murmur, rub or gallop, regular rate and rhythm Lungs: clear to auscultation, no wheezes or rales and unlabored breathing Abdomen: abdomen is soft without significant tenderness, masses, organomegaly or guarding Extremities: mild 2-3 cm peripheral erythema on L medial ankle/distal leg, distal pulses intact  Skin:as above  Neurology: lethargic, confused  Labs and Imaging: Lab Results  Component Value Date/Time   NA 144 04/16/2014 09:11 PM   NA 143  02/21/2014   K 3.6 04/16/2014 09:11 PM   CL 101 04/16/2014 09:11 PM   CO2 32 04/16/2014 09:01 PM   BUN 30* 04/16/2014 09:11 PM   BUN 20 02/21/2014   CREATININE 1.20* 04/16/2014 09:11 PM   CREATININE 0.9 02/21/2014   GLUCOSE 171* 04/16/2014 09:11 PM   Lab Results  Component Value Date   WBC 4.0 04/16/2014   HGB 11.6* 04/16/2014   HCT 34.0* 04/16/2014   MCV 101.5* 04/16/2014   PLT 105* 04/16/2014    Dg Chest 2 View  04/16/2014   CLINICAL DATA:  Altered mental status  EXAM: CHEST  2 VIEW  COMPARISON:  03/03/2009  FINDINGS: Cardiomediastinal silhouette is stable. No acute infiltrate or pleural effusion. No pulmonary edema. Stable compression deformity lower thoracic spine. Diffuse osteopenia. There is chronic elevation of the left hemidiaphragm.  IMPRESSION: No active cardiopulmonary disease.   Electronically Signed   By: Natasha Mead M.D.   On: 04/16/2014 22:21   Ct Head Wo Contrast  04/16/2014   CLINICAL DATA:  Dementia. Family reports patient is more lethargic unusual.  EXAM: CT HEAD WITHOUT CONTRAST  TECHNIQUE: Contiguous axial images were obtained from the base of the skull through the vertex without intravenous contrast.  COMPARISON:  05/09/2009  FINDINGS: There is no intracranial  hemorrhage, mass or evidence of acute infarction. There is severe atrophy and microvascular ischemic disease. There is no extra-axial fluid collection. Compare to the prior study of 05/09/2009 there is mildly worsened generalized volume loss but no new focal abnormalities.  There is near complete opacification of the left maxillary sinus, new from 2011 but probably not acute given the bony sclerosis. No destructive bone changes are evident. The other paranasal sinuses are clear.  IMPRESSION: 1. No acute intracranial findings. 2. Severe volume loss and chronic microvascular ischemic changes 3. Left maxillary sinus disease, likely chronic.   Electronically Signed   By: Ellery Plunk M.D.   On: 04/16/2014 22:37           Doree Albee MD  Pager: [phone]

## 2014-04-16 NOTE — ED Provider Notes (Signed)
CSN: [id number]     Arrival date & time 04/16/14  2048 History   First MD Initiated Contact with Patient 04/16/14 2059     Chief Complaint  Patient presents with  . Weakness    HPI  Patient presents from her nursing facility with family concerned of decreased interactivity, increasing listlessness.  Patient has dementia, schizophrenia, level V caveat.  On initial evaluation there are no family members present, nursing home staff present, level V caveat secondary to baseline dementia. Patient denies pain. She otherwise cannot provide appropriate answers to history of present illness questions.  Past Medical History  Diagnosis Date  . MYALGIA 07/13/2007    Qualifier: Diagnosis of  By: Gavin PottersGRANDIS MD, HEIDI    . SCHIZOPHRENIA 04/29/2006    Qualifier: Diagnosis of  By: Erenest RasherVANSTORY MD, MADELEINE    . TREMOR, ESSENTIAL 05/28/2006    Qualifier: Diagnosis of  By: Erenest RasherVANSTORY MD, MADELEINE    . Insomnia 08/11/2013  . Anemia   . Thyroid disease   . Hyperlipidemia   . Hypertension   . Depression   . CHF (congestive heart failure)    History reviewed. No pertinent past surgical history. No family history on file. History  Substance Use Topics  . Smoking status: Unknown If Ever Smoked  . Smokeless tobacco: Not on file  . Alcohol Use: Not on file   OB History    No data available     Review of Systems  Unable to perform ROS: Dementia      Allergies  Erythromycin; Penicillins; and Sulfa antibiotics  Home Medications   Prior to Admission medications   Medication Sig Start Date End Date Taking? Authorizing Provider  cyanocobalamin 500 MCG tablet Take 500 mcg by mouth daily. For B-12 deficiency   Yes Historical Provider, MD  DULoxetine (CYMBALTA) 30 MG capsule Take 30 mg by mouth daily. For anxiety   Yes Historical Provider, MD  furosemide (LASIX) 40 MG tablet Take 40 mg by mouth daily.   Yes Historical Provider, MD  guaiFENesin (ROBITUSSIN) 100 MG/5ML SOLN Take 10 mLs by mouth every 6 (six)  hours as needed (cough).    Yes Historical Provider, MD  ipratropium-albuterol (DUONEB) 0.5-2.5 (3) MG/3ML SOLN Take 3 mLs by nebulization 3 (three) times daily. And daily as needed   Yes Historical Provider, MD  lactulose (CHRONULAC) 10 GM/15ML solution Take 30 g by mouth every morning.    Yes Historical Provider, MD  levothyroxine (SYNTHROID, LEVOTHROID) 100 MCG tablet Take 100 mcg by mouth daily. For hypothyroidism   Yes Historical Provider, MD  Multiple Vitamins-Minerals (MULTIVITAMIN WITH MINERALS) tablet Take 1 tablet by mouth daily.   Yes Historical Provider, MD  polyethylene glycol powder (MIRALAX) powder Take 15 g by mouth daily. For constipation. Dispense 1 month supply.   Yes Historical Provider, MD  polyvinyl alcohol-povidone (HYPOTEARS) 1.4-0.6 % ophthalmic solution Place 1 drop into both eyes 3 (three) times daily. For dry eyes   Yes Historical Provider, MD  potassium chloride SA (K-DUR,KLOR-CON) 20 MEQ tablet Take 20 mEq by mouth daily. For heart disease   Yes Historical Provider, MD  QUEtiapine (SEROQUEL) 25 MG tablet Take 25 mg by mouth at bedtime.   Yes Historical Provider, MD   BP 137/77 mmHg  Pulse 67  Temp(Src) 99.5 F (37.5 C) (Oral)  Resp 18  Ht 5\' 6"  (1.676 m)  Wt 143 lb (64.864 kg)  BMI 23.09 kg/m2  SpO2 95% Physical Exam  Constitutional: She appears listless. She has a sickly appearance.  Frail elderly female  HENT:  Head: Normocephalic and atraumatic.  Patient is edentulous  Eyes: Conjunctivae and EOM are normal.  Neck: No tracheal deviation present.  Cardiovascular: Normal rate and regular rhythm.   Murmur heard. Pulmonary/Chest: Effort normal. No stridor. No respiratory distress. She has decreased breath sounds.  Abdominal: She exhibits no distension.  Musculoskeletal: She exhibits no edema.  Neurological: She appears listless. She displays atrophy. She displays no tremor. She exhibits abnormal muscle tone. She displays no seizure activity. Coordination  normal.  Patient is not capable of following commands, speech is brief, occasionally appropriate. Faces symmetric, patient moves all extremity spontaneously.  Skin: Skin is warm and dry.     Psychiatric: Cognition and memory are impaired. She is noncommunicative.  Patient speaks minimally  Nursing note and vitals reviewed.   ED Course  Procedures (including critical care time) Labs Review Labs Reviewed  CBC - Abnormal; Notable for the following:    RBC 3.41 (*)    Hemoglobin 11.3 (*)    HCT 34.6 (*)    MCV 101.5 (*)    Platelets 105 (*)    All other components within normal limits  BASIC METABOLIC PANEL - Abnormal; Notable for the following:    Glucose, Bld 175 (*)    BUN 28 (*)    Creatinine, Ser 1.24 (*)    GFR calc non Af Amer 37 (*)    GFR calc Af Amer 43 (*)    All other components within normal limits  URINALYSIS, ROUTINE W REFLEX MICROSCOPIC - Abnormal; Notable for the following:    Urobilinogen, UA 2.0 (*)    All other components within normal limits  I-STAT CG4 LACTIC ACID, ED - Abnormal; Notable for the following:    Lactic Acid, Venous 2.21 (*)    All other components within normal limits  I-STAT CHEM 8, ED - Abnormal; Notable for the following:    BUN 30 (*)    Creatinine, Ser 1.20 (*)    Glucose, Bld 171 (*)    Hemoglobin 11.6 (*)    HCT 34.0 (*)    All other components within normal limits   After the initial evaluation the patient's family arrives. State that over the past 3 days in particular the patient has had complete change in interactivity. There was concern for possible medication adjustments, as well as possible skin changes.  On repeat exam the patient remains listless appearing  Imaging Review Dg Chest 2 View  04/16/2014   CLINICAL DATA:  Altered mental status  EXAM: CHEST  2 VIEW  COMPARISON:  03/03/2009  FINDINGS: Cardiomediastinal silhouette is stable. No acute infiltrate or pleural effusion. No pulmonary edema. Stable compression  deformity lower thoracic spine. Diffuse osteopenia. There is chronic elevation of the left hemidiaphragm.  IMPRESSION: No active cardiopulmonary disease.   Electronically Signed   By: Natasha Mead M.D.   On: 04/16/2014 22:21   Ct Head Wo Contrast  04/16/2014   CLINICAL DATA:  Dementia. Family reports patient is more lethargic unusual.  EXAM: CT HEAD WITHOUT CONTRAST  TECHNIQUE: Contiguous axial images were obtained from the base of the skull through the vertex without intravenous contrast.  COMPARISON:  05/09/2009  FINDINGS: There is no intracranial hemorrhage, mass or evidence of acute infarction. There is severe atrophy and microvascular ischemic disease. There is no extra-axial fluid collection. Compare to the prior study of 05/09/2009 there is mildly worsened generalized volume loss but no new focal abnormalities.  There is near complete opacification of the  left maxillary sinus, new from 2011 but probably not acute given the bony sclerosis. No destructive bone changes are evident. The other paranasal sinuses are clear.  IMPRESSION: 1. No acute intracranial findings. 2. Severe volume loss and chronic microvascular ischemic changes 3. Left maxillary sinus disease, likely chronic.   Electronically Signed   By: Ellery Plunk M.D.   On: 04/16/2014 22:37    Initial labs notable for elevated creatinine from baseline. Patient received empiric fluid resuscitation, this will continue.   MDM  Elderly female presents from nursing home with decreased interactivity, familial concerns of marketed change from baseline though the patient has dementia. Patient is essentially non-participatory on exam. Labs notable for mild elevation in creatinine. Physical exam is notable for erythematous, warm area on the right lower leg, likely cellulitis. This may be contributory to her cytopathic picture. Patient's CT scan did not demonstrate acute stroke, labs do not suggest sepsis, but with altered mental status,  cellulitis, patient was admitted for further evaluation, management after initiation of antibiotics.    Gerhard Munch, MD 04/16/14 [phone]

## 2014-04-16 NOTE — ED Notes (Signed)
MD at bedside. 

## 2014-04-17 ENCOUNTER — Encounter (HOSPITAL_COMMUNITY): Payer: Self-pay | Admitting: General Practice

## 2014-04-17 DIAGNOSIS — F0151 Vascular dementia with behavioral disturbance: Secondary | ICD-10-CM | POA: Diagnosis present

## 2014-04-17 DIAGNOSIS — M7989 Other specified soft tissue disorders: Secondary | ICD-10-CM

## 2014-04-17 DIAGNOSIS — I10 Essential (primary) hypertension: Secondary | ICD-10-CM | POA: Diagnosis present

## 2014-04-17 DIAGNOSIS — E860 Dehydration: Secondary | ICD-10-CM | POA: Diagnosis present

## 2014-04-17 DIAGNOSIS — Z881 Allergy status to other antibiotic agents status: Secondary | ICD-10-CM | POA: Diagnosis not present

## 2014-04-17 DIAGNOSIS — F329 Major depressive disorder, single episode, unspecified: Secondary | ICD-10-CM | POA: Diagnosis present

## 2014-04-17 DIAGNOSIS — Z880 Allergy status to penicillin: Secondary | ICD-10-CM | POA: Diagnosis not present

## 2014-04-17 DIAGNOSIS — D519 Vitamin B12 deficiency anemia, unspecified: Secondary | ICD-10-CM | POA: Diagnosis present

## 2014-04-17 DIAGNOSIS — N179 Acute kidney failure, unspecified: Secondary | ICD-10-CM | POA: Diagnosis present

## 2014-04-17 DIAGNOSIS — E039 Hypothyroidism, unspecified: Secondary | ICD-10-CM | POA: Diagnosis present

## 2014-04-17 DIAGNOSIS — E785 Hyperlipidemia, unspecified: Secondary | ICD-10-CM | POA: Diagnosis present

## 2014-04-17 DIAGNOSIS — Z79899 Other long term (current) drug therapy: Secondary | ICD-10-CM | POA: Diagnosis not present

## 2014-04-17 DIAGNOSIS — G250 Essential tremor: Secondary | ICD-10-CM | POA: Diagnosis present

## 2014-04-17 DIAGNOSIS — I672 Cerebral atherosclerosis: Secondary | ICD-10-CM | POA: Diagnosis present

## 2014-04-17 DIAGNOSIS — F209 Schizophrenia, unspecified: Secondary | ICD-10-CM | POA: Diagnosis present

## 2014-04-17 DIAGNOSIS — L03116 Cellulitis of left lower limb: Secondary | ICD-10-CM | POA: Diagnosis present

## 2014-04-17 DIAGNOSIS — R531 Weakness: Secondary | ICD-10-CM | POA: Diagnosis present

## 2014-04-17 DIAGNOSIS — G9341 Metabolic encephalopathy: Secondary | ICD-10-CM | POA: Diagnosis present

## 2014-04-17 DIAGNOSIS — I509 Heart failure, unspecified: Secondary | ICD-10-CM | POA: Diagnosis present

## 2014-04-17 DIAGNOSIS — L03115 Cellulitis of right lower limb: Secondary | ICD-10-CM | POA: Diagnosis present

## 2014-04-17 LAB — COMPREHENSIVE METABOLIC PANEL
ALT: 12 U/L (ref 0–35)
AST: 22 U/L (ref 0–37)
Albumin: 2.7 g/dL — ABNORMAL LOW (ref 3.5–5.2)
Alkaline Phosphatase: 77 U/L (ref 39–117)
Anion gap: 4 — ABNORMAL LOW (ref 5–15)
BILIRUBIN TOTAL: 0.6 mg/dL (ref 0.3–1.2)
BUN: 27 mg/dL — ABNORMAL HIGH (ref 6–23)
CHLORIDE: 109 mmol/L (ref 96–112)
CO2: 30 mmol/L (ref 19–32)
Calcium: 8.4 mg/dL (ref 8.4–10.5)
Creatinine, Ser: 1.03 mg/dL (ref 0.50–1.10)
GFR calc Af Amer: 54 mL/min — ABNORMAL LOW (ref >90)
GFR, EST NON AFRICAN AMERICAN: 47 mL/min — AB (ref >90)
GLUCOSE: 105 mg/dL — AB (ref 70–99)
Potassium: 3.4 mmol/L — ABNORMAL LOW (ref 3.5–5.1)
SODIUM: 143 mmol/L (ref 135–145)
Total Protein: 4.7 g/dL — ABNORMAL LOW (ref 6.0–8.3)

## 2014-04-17 LAB — CBC WITH DIFFERENTIAL/PLATELET
Basophils Absolute: 0 10*3/uL (ref 0.0–0.1)
Basophils Relative: 1 % (ref 0–1)
Eosinophils Absolute: 0.3 10*3/uL (ref 0.0–0.7)
Eosinophils Relative: 7 % — ABNORMAL HIGH (ref 0–5)
HEMATOCRIT: 32.5 % — AB (ref 36.0–46.0)
HEMOGLOBIN: 10.5 g/dL — AB (ref 12.0–15.0)
LYMPHS ABS: 0.8 10*3/uL (ref 0.7–4.0)
Lymphocytes Relative: 19 % (ref 12–46)
MCH: 33 pg (ref 26.0–34.0)
MCHC: 32.3 g/dL (ref 30.0–36.0)
MCV: 102.2 fL — ABNORMAL HIGH (ref 78.0–100.0)
MONO ABS: 0.4 10*3/uL (ref 0.1–1.0)
Monocytes Relative: 10 % (ref 3–12)
NEUTROS ABS: 2.6 10*3/uL (ref 1.7–7.7)
Neutrophils Relative %: 65 % (ref 43–77)
Platelets: 101 10*3/uL — ABNORMAL LOW (ref 150–400)
RBC: 3.18 MIL/uL — ABNORMAL LOW (ref 3.87–5.11)
RDW: 13.2 % (ref 11.5–15.5)
WBC: 4.1 10*3/uL (ref 4.0–10.5)

## 2014-04-17 LAB — LACTIC ACID, PLASMA: LACTIC ACID, VENOUS: 1.3 mmol/L (ref 0.5–2.0)

## 2014-04-17 LAB — MRSA PCR SCREENING: MRSA BY PCR: POSITIVE — AB

## 2014-04-17 LAB — AMMONIA: Ammonia: 35 umol/L — ABNORMAL HIGH (ref 11–32)

## 2014-04-17 MED ORDER — MUPIROCIN 2 % EX OINT
1.0000 | TOPICAL_OINTMENT | Freq: Two times a day (BID) | CUTANEOUS | Status: DC
Start: 2014-04-17 — End: 2014-04-20
  Administered 2014-04-17 – 2014-04-20 (×7): 1 via NASAL
  Filled 2014-04-17 (×2): qty 22

## 2014-04-17 MED ORDER — CHLORHEXIDINE GLUCONATE CLOTH 2 % EX PADS
6.0000 | MEDICATED_PAD | Freq: Every day | CUTANEOUS | Status: DC
Start: 2014-04-17 — End: 2014-04-20
  Administered 2014-04-17 – 2014-04-20 (×4): 6 via TOPICAL

## 2014-04-17 MED ORDER — POTASSIUM CHLORIDE 20 MEQ/15ML (10%) PO SOLN
40.0000 meq | Freq: Once | ORAL | Status: DC
  Filled 2014-04-17: qty 30

## 2014-04-17 NOTE — Progress Notes (Signed)
*  PRELIMINARY RESULTS* Vascular Ultrasound Lower extremity venous duplex has been completed.  Preliminary findings: No evidence of DVT  Farrel DemarkJill Eunice, RDMS, RVT  04/17/2014, 12:41 PM

## 2014-04-17 NOTE — Progress Notes (Signed)
Patient arrived to [address]  from  [facility] and assisted to bed by nursing staff.No acute distress noted at present time.

## 2014-04-17 NOTE — ED Notes (Signed)
Attempted report 

## 2014-04-17 NOTE — Progress Notes (Signed)
UR completed 

## 2014-04-17 NOTE — Progress Notes (Signed)
[facility] PROGRESS NOTE  Melanie Roberson WJX:[id number] DOB: May 11, 1923 DOA: 04/16/2014 PCP: Kirt Boys, DO  Summary I have seen and examined Melanie Roberson at bedside and reviewed her chart. She is not giving a meaningful history, therefore history obtained from records. Melanie Roberson is an 81 y.o. year old female with significant past medical history of dementia, schizophrenia, HTN, CHF?EF, hypothyroidism who presented with encephalopathy felt to be due to lower extremity cellulitis. She also had acute kidney injury with BUN 28 creatinine 1.24 from a normal baseline in December. She was placed on clindamycin and IV fluids and seems to be making progress. We'll therefore continue current medications. Plan Metabolic Encephalopathy/Cellulitis  Day 2 clindamycin. Continue to complete 7-10 days of antibiotics. AKI (acute kidney injury)  Improving  Continue IV fluids/replenish electrolytes as necessary. Hypothyroidism/depression/essential hypertension  No acute changes  Continue current management Code Status: Full Family Communication: Called listed contacts in chart without success Disposition Plan: ?Back to SNF   Consultants:  None.  Procedures:  None.  Antibiotics:  Clindamycin 04/16/14>  HPI/Subjective: Not responding to questions appropriately  Objective: Filed Vitals:   04/17/14 1341  BP: 122/24  Pulse: 59  Temp: 98.6 F (37 C)  Resp: 18    Intake/Output Summary (Last 24 hours) at 04/17/14 1735 Last data filed at 04/17/14 1457  Gross per 24 hour  Intake 873.33 ml  Output      0 ml  Net 873.33 ml   Filed Weights   04/16/14 2056 04/17/14 0117  Weight: 64.864 kg (143 lb) 65 kg (143 lb 4.8 oz)    Exam:   General:  Comfortable at rest.  Cardiovascular: S1-S2 normal. No murmurs. Pulse regular.  Respiratory: Good air entry bilaterally. No rhonchi or rales.  Abdomen: Soft and nontender. Normal bowel sounds. No organomegaly.  Musculoskeletal:  No pedal edema   Neurological: Alert. No focal deficits. Symptoms confused.  Data Reviewed: Basic Metabolic Panel:  Recent Labs Lab 04/16/14 2101 04/16/14 2111 04/16/14 2358  NA 143 144 143  K 3.6 3.6 3.4*  CL 105 101 109  CO2 32  --  30  GLUCOSE 175* 171* 105*  BUN 28* 30* 27*  CREATININE 1.24* 1.20* 1.03  CALCIUM 9.0  --  8.4   Liver Function Tests:  Recent Labs Lab 04/16/14 2358  AST 22  ALT 12  ALKPHOS 77  BILITOT 0.6  PROT 4.7*  ALBUMIN 2.7*   No results for input(s): LIPASE, AMYLASE in the last 168 hours.  Recent Labs Lab 04/16/14 2358  AMMONIA 35*   CBC:  Recent Labs Lab 04/16/14 2101 04/16/14 2111 04/16/14 2358  WBC 4.0  --  4.1  NEUTROABS  --   --  2.6  HGB 11.3* 11.6* 10.5*  HCT 34.6* 34.0* 32.5*  MCV 101.5*  --  102.2*  PLT 105*  --  101*   Cardiac Enzymes: No results for input(s): CKTOTAL, CKMB, CKMBINDEX, TROPONINI in the last 168 hours. BNP (last 3 results) No results for input(s): BNP in the last 8760 hours.  ProBNP (last 3 results) No results for input(s): PROBNP in the last 8760 hours.  CBG: No results for input(s): GLUCAP in the last 168 hours.  Recent Results (from the past 240 hour(s))  MRSA PCR Screening     Status: Abnormal   Collection Time: 04/17/14  1:16 AM  Result Value Ref Range Status   MRSA by PCR POSITIVE (A) NEGATIVE Final    Comment:        The  GeneXpert MRSA Assay (FDA approved for NASAL specimens only), is one component of a comprehensive MRSA colonization surveillance program. It is not intended to diagnose MRSA infection nor to guide or monitor treatment for MRSA infections. RESULT CALLED TO, READ BACK BY AND VERIFIED WITH: LOWE,A RN [phone] AT 0445 SKEEN,P      Studies: Dg Chest 2 View  04/16/2014   CLINICAL DATA:  Altered mental status  EXAM: CHEST  2 VIEW  COMPARISON:  03/03/2009  FINDINGS: Cardiomediastinal silhouette is stable. No acute infiltrate or pleural effusion. No pulmonary edema.  Stable compression deformity lower thoracic spine. Diffuse osteopenia. There is chronic elevation of the left hemidiaphragm.  IMPRESSION: No active cardiopulmonary disease.   Electronically Signed   By: Natasha MeadLiviu  Pop M.D.   On: 04/16/2014 22:21   Ct Head Wo Contrast  04/16/2014   CLINICAL DATA:  Dementia. Family reports patient is more lethargic unusual.  EXAM: CT HEAD WITHOUT CONTRAST  TECHNIQUE: Contiguous axial images were obtained from the base of the skull through the vertex without intravenous contrast.  COMPARISON:  05/09/2009  FINDINGS: There is no intracranial hemorrhage, mass or evidence of acute infarction. There is severe atrophy and microvascular ischemic disease. There is no extra-axial fluid collection. Compare to the prior study of 05/09/2009 there is mildly worsened generalized volume loss but no new focal abnormalities.  There is near complete opacification of the left maxillary sinus, new from 2011 but probably not acute given the bony sclerosis. No destructive bone changes are evident. The other paranasal sinuses are clear.  IMPRESSION: 1. No acute intracranial findings. 2. Severe volume loss and chronic microvascular ischemic changes 3. Left maxillary sinus disease, likely chronic.   Electronically Signed   By: Ellery Plunkaniel R Mitchell M.D.   On: 04/16/2014 22:37    Scheduled Meds: . Chlorhexidine Gluconate Cloth  6 each Topical Q0600  . clindamycin (CLEOCIN) IV  600 mg Intravenous 3 times per day  . DULoxetine  30 mg Oral Daily  . heparin  5,000 Units Subcutaneous 3 times per day  . ipratropium-albuterol  3 mL Nebulization TID  . lactulose  30 g Oral q morning - 10a  . levothyroxine  100 mcg Oral QAC breakfast  . mupirocin ointment  1 application Nasal BID  . polyethylene glycol  15 g Oral Daily  . QUEtiapine  25 mg Oral QHS   Continuous Infusions: . sodium chloride 100 mL/hr at 04/17/14 0604     Time spent: 25 minutes    Merelyn Klump  [facility] Pager [phone].  If 7PM-7AM, please contact night-coverage at [email or web address], password [email or web address] 04/17/2014, 5:35 PM  LOS: 0 days

## 2014-04-18 LAB — CBC WITH DIFFERENTIAL/PLATELET
BASOS ABS: 0 10*3/uL (ref 0.0–0.1)
Basophils Relative: 1 % (ref 0–1)
EOS ABS: 0.3 10*3/uL (ref 0.0–0.7)
Eosinophils Relative: 8 % — ABNORMAL HIGH (ref 0–5)
HEMATOCRIT: 30.8 % — AB (ref 36.0–46.0)
Hemoglobin: 10.5 g/dL — ABNORMAL LOW (ref 12.0–15.0)
LYMPHS ABS: 0.7 10*3/uL (ref 0.7–4.0)
LYMPHS PCT: 21 % (ref 12–46)
MCH: 33.8 pg (ref 26.0–34.0)
MCHC: 34.1 g/dL (ref 30.0–36.0)
MCV: 99 fL (ref 78.0–100.0)
Monocytes Absolute: 0.3 10*3/uL (ref 0.1–1.0)
Monocytes Relative: 9 % (ref 3–12)
Neutro Abs: 2.2 10*3/uL (ref 1.7–7.7)
Neutrophils Relative %: 61 % (ref 43–77)
Platelets: DECREASED 10*3/uL (ref 150–400)
RBC: 3.11 MIL/uL — ABNORMAL LOW (ref 3.87–5.11)
RDW: 13.5 % (ref 11.5–15.5)
Smear Review: DECREASED
WBC: 3.5 10*3/uL — ABNORMAL LOW (ref 4.0–10.5)

## 2014-04-18 LAB — COMPREHENSIVE METABOLIC PANEL
ALBUMIN: 2.5 g/dL — AB (ref 3.5–5.2)
ALT: 9 U/L (ref 0–35)
ANION GAP: 6 (ref 5–15)
AST: 28 U/L (ref 0–37)
Alkaline Phosphatase: 73 U/L (ref 39–117)
BUN: 17 mg/dL (ref 6–23)
CALCIUM: 8.6 mg/dL (ref 8.4–10.5)
CO2: 24 mmol/L (ref 19–32)
Chloride: 114 mmol/L — ABNORMAL HIGH (ref 96–112)
Creatinine, Ser: 0.81 mg/dL (ref 0.50–1.10)
GFR calc non Af Amer: 63 mL/min — ABNORMAL LOW (ref >90)
GFR, EST AFRICAN AMERICAN: 72 mL/min — AB (ref >90)
GLUCOSE: 84 mg/dL (ref 70–99)
Potassium: 4.1 mmol/L (ref 3.5–5.1)
Sodium: 144 mmol/L (ref 135–145)
TOTAL PROTEIN: 4.4 g/dL — AB (ref 6.0–8.3)
Total Bilirubin: 0.9 mg/dL (ref 0.3–1.2)

## 2014-04-18 LAB — PHOSPHORUS: Phosphorus: 3.2 mg/dL (ref 2.3–4.6)

## 2014-04-18 LAB — MAGNESIUM: Magnesium: 1.8 mg/dL (ref 1.5–2.5)

## 2014-04-18 MED ORDER — ENSURE COMPLETE PO LIQD
237.0000 mL | ORAL | Status: DC
  Administered 2014-04-19 – 2014-04-20 (×2): 237 mL via ORAL

## 2014-04-18 NOTE — Clinical Social Work Psychosocial (Signed)
Clinical Social Work Department BRIEF PSYCHOSOCIAL ASSESSMENT 04/18/2014  Patient:  Melanie PiperSCHAL,Jozey M     Account Number:  [id number]     Admit date:  04/16/2014  Clinical Social Worker:  Lavell LusterAMPBELL,Palmer Shorey BRYANT, LCSWA  Date/Time:  04/18/2014 01:57 PM  Referred by:  Physician  Date Referred:  04/18/2014 Referred for  SNF Placement   Other Referral:   NA   Interview type:  Family Other interview type:   Patient's granddaughter Britta MccreedyBarbara interviewed at bedside to complete assessment.    PSYCHOSOCIAL DATA Living Status:  FACILITY Admitted from facility:  [facility], [place] Level of care:  Skilled Nursing Facility Primary support name:  Britta MccreedyBarbara Primary support relationship to patient:  FAMILY Degree of support available:   Support is fair.    CURRENT CONCERNS Current Concerns  Post-Acute Placement   Other Concerns:   NA    SOCIAL WORK ASSESSMENT / PLAN CSW spoke with patient's granddaughter Britta MccreedyBarbara to complete assessment. Per Britta MccreedyBarbara, [facility] [place] and she reports that the patient will return to the facility once medically stable for discharge. Per Britta MccreedyBarbara, she does have concerns with the facility but believes it will be best for the patient to return to the facility when discharged.    Britta MccreedyBarbara reports that she has been overwhelmed with many of her own health problems but tries to be as involved as she can with the patient's care and affairs. CSW explained role and process of getting the patient back to the facility once discharged. CSW will assist.   Assessment/plan status:  Psychosocial Support/Ongoing Assessment of Needs Other assessment/ plan:   Complete FL2, Fax, PASRR   Information/referral to community resources:   CSW contact information given.    PATIENT'S/FAMILY'S RESPONSE TO PLAN OF CARE: [facility]  once medically [facility] for DC. CSW will assist.        Roddie McBryant Zoeann Mol MSW, [place], [place], [phone]

## 2014-04-18 NOTE — Evaluation (Signed)
Physical Therapy Evaluation Patient Details Name: Melanie PiperHelen M Roberson MRN: [id number] DOB: 1923/03/08 Today's Date: 04/18/2014   History of Present Illness  This is a 81 y.o. year old female with significant past medical history of dementia, schizophrenia, HTN, CHF, hypothyroidism presenting with encephalopath, cellulitis. Level V caveat as pt is acutely confused. No family at bedside. Per report, pt w/ confusion above baseline. More lethargic. Report of family stating that pt had change in baseline.  Clinical Impression  Pt admitted with/for confusion different from baseline/encephalopathy.  Pt currently limited functionally due to the problems listed. ( See problems list.)   Pt will benefit from PT to maximize function and safety in order to get ready for next venue listed below.     Follow Up Recommendations SNF;Supervision/Assistance - 24 hour    Equipment Recommendations  Other (comment) (TBA at next venue)    Recommendations for Other Services       Precautions / Restrictions Precautions Precautions: Fall      Mobility  Bed Mobility Overal bed mobility: Needs Assistance Bed Mobility: Rolling;Supine to Sit;Sit to Supine Rolling: Min assist   Supine to sit: Min assist Sit to supine: Mod assist   General bed mobility comments: cues for guidance and truncal assist  Transfers Overall transfer level: Needs assistance Equipment used: 2 person hand held assist Transfers: Sit to/from Stand Sit to Stand: Mod assist;+2 safety/equipment         General transfer comment: assist for lift and coming forward.  Stability assist during pericare.  Ambulation/Gait                Stairs            Wheelchair Mobility    Modified Rankin (Stroke Patients Only)       Balance Overall balance assessment: Needs assistance Sitting-balance support: No upper extremity supported;Single extremity supported;Feet supported Sitting balance-Leahy Scale: Fair     Standing  balance support: Single extremity supported;Bilateral upper extremity supported Standing balance-Leahy Scale: Poor Standing balance comment: stood x2 for pericare and positioning .  Needed stability assist in standing.  Lift assist for up and down.                             Pertinent Vitals/Pain Pain Assessment: Faces Faces Pain Scale: No hurt    Home Living Family/patient expects to be discharged to:: Skilled nursing facility                      Prior Function Level of Independence:  (no family available to get PLOF)               Hand Dominance        Extremity/Trunk Assessment   Upper Extremity Assessment:  (functional use of UE for bed mobility and transition - stand)           Lower Extremity Assessment: Generalized weakness;Difficult to assess due to impaired cognition         Communication   Communication: No difficulties  Cognition Arousal/Alertness: Awake/alert Behavior During Therapy: Restless;WFL for tasks assessed/performed Overall Cognitive Status: History of cognitive impairments - at baseline       Memory: Decreased short-term memory;Decreased recall of precautions              General Comments General comments (skin integrity, edema, etc.): Extensive rolling for copious loose stool prior to OOB mobility    Exercises  Assessment/Plan    PT Assessment Patient needs continued PT services  PT Diagnosis Generalized weakness;Difficulty walking   PT Problem List Decreased strength;Decreased activity tolerance;Decreased balance;Decreased mobility;Decreased cognition  PT Treatment Interventions DME instruction;Gait training;Functional mobility training;Therapeutic activities;Therapeutic exercise;Balance training;Patient/family education   PT Goals (Current goals can be found in the Care Plan section) Acute Rehab PT Goals Patient Stated Goal: pt unable to participate PT Goal Formulation: Patient unable to  participate in goal setting Time For Goal Achievement: 04/25/14 Potential to Achieve Goals: Fair    Frequency Min 2X/week   Barriers to discharge        Co-evaluation               End of Session   Activity Tolerance: Patient tolerated treatment well Patient left: in bed;with call bell/phone within reach Nurse Communication: Mobility status         Time: 1610-9604 PT Time Calculation (min) (ACUTE ONLY): 25 min   Charges:   PT Evaluation $Initial PT Evaluation Tier I: 1 Procedure PT Treatments $Therapeutic Activity: 8-22 mins   PT G Codes:        Analyssa Roberson, Melanie Roberson 04/18/2014, 5:14 PM 04/18/2014  Ripley Bing, PT [phone]  (pager)

## 2014-04-18 NOTE — Progress Notes (Signed)
INITIAL NUTRITION ASSESSMENT  DOCUMENTATION CODES Per approved criteria  -Not Applicable   INTERVENTION: Ensure Complete po daily, each supplement provides 350 kcal and 13 grams of protein  NUTRITION DIAGNOSIS: Inadequate oral intake related to decreased appetite as evidenced by diet hx, PO: 50%.   Goal: Pt will meet >90% of estimated nutritional needs  Monitor:  PO/supplement intake, labs, weight changes, I/O's  Reason for Assessment: MST=2  81 y.o. female  Admitting Dx: <principal problem not specified>  Melanie Roberson is an 81 y.o. year old female with significant past medical history of dementia, schizophrenia, HTN, CHF?EF, hypothyroidism who presented with encephalopathy felt to be due to lower extremity cellulitis  ASSESSMENT: Pt admitted with metabolic encephalopathy and cellulitis.  Pt reports appetite is good at baseline. She reports that she eats very well at SNF, however, didn't eat much at breakfast this morning. Documented meal intake 50%.  Pt reports that she has lost weight, however, is unable to provide further details on weight loss. Wt hx reveals wt stability over the past year.  She denies difficulty chewing and swallowing foods and reveals she consumes at [facility]. She reports she also drinks a nutritional supplement daily PTA and would like to have Ensure during hospitalization.  Fat and muscle depletion on physical exam likely due to advanced age.  Labs reviewed. Cl: 114.   Nutrition Focused Physical Exam:  Subcutaneous Fat:  Orbital Region: severe depletion Upper Arm Region: mild depletion Thoracic and Lumbar Region: WDL  Muscle:  Temple Region: severe depletion Clavicle Bone Region: WDL Clavicle and Acromion Bone Region: WDL Scapular Bone Region: WDL Dorsal Hand: WDL Patellar Region: WDL Anterior Thigh Region: mild depletion Posterior Calf Region: WDL  Edema: none present   Height: Ht Readings from Last 1 Encounters:   04/17/14 5\' 6"  (1.676 m)    Weight: Wt Readings from Last 1 Encounters:  04/17/14 143 lb 4.8 oz (65 kg)    Ideal Body Weight: 130#  % Ideal Body Weight: 110%  Wt Readings from Last 10 Encounters:  04/17/14 143 lb 4.8 oz (65 kg)  03/27/14 143 lb (64.864 kg)  02/08/14 144 lb (65.318 kg)  12/01/13 146 lb (66.225 kg)  11/10/13 146 lb (66.225 kg)  09/08/13 147 lb (66.679 kg)  08/10/13 144 lb (65.318 kg)  11/29/12 157 lb (71.215 kg)  07/13/13 147 lb (66.679 kg)  06/09/13 149 lb (67.586 kg)    Usual Body Weight: 144#  % Usual Body Weight: 99%  BMI:  Body mass index is 23.14 kg/(m^2). Normal weight range  Estimated Nutritional Needs: Kcal: 1600-1800 Protein: 72-82 grams Fluid: 1.6-1.8 L  Skin: stage I pressure ulcer on sacrum, skin tear on rt arm, ecchymosis  Diet Order: Diet Heart  EDUCATION NEEDS: -Education needs addressed   Intake/Output Summary (Last 24 hours) at 04/18/14 1132 Last data filed at 04/18/14 0600  Gross per 24 hour  Intake 2643.33 ml  Output      0 ml  Net 2643.33 ml    Last BM: 04/17/14  Labs:   Recent Labs Lab 04/16/14 2101 04/16/14 2111 04/16/14 2358 04/18/14 0805  NA 143 144 143 144  K 3.6 3.6 3.4* 4.1  CL 105 101 109 114*  CO2 32  --  30 24  BUN 28* 30* 27* 17  CREATININE 1.24* 1.20* 1.03 0.81  CALCIUM 9.0  --  8.4 8.6  MG  --   --   --  1.8  PHOS  --   --   --  3.2  GLUCOSE 175* 171* 105* 84    CBG (last 3)  No results for input(s): GLUCAP in the last 72 hours.  Scheduled Meds: . Chlorhexidine Gluconate Cloth  6 each Topical Q0600  . clindamycin (CLEOCIN) IV  600 mg Intravenous 3 times per day  . DULoxetine  30 mg Oral Daily  . heparin  5,000 Units Subcutaneous 3 times per day  . ipratropium-albuterol  3 mL Nebulization TID  . lactulose  30 g Oral q morning - 10a  . levothyroxine  100 mcg Oral QAC breakfast  . mupirocin ointment  1 application Nasal BID  . polyethylene glycol  15 g Oral Daily  . potassium  chloride  40 mEq Oral Once  . QUEtiapine  25 mg Oral QHS    Continuous Infusions: . sodium chloride 100 mL/hr at 04/18/14 6213    Past Medical History  Diagnosis Date  . MYALGIA 07/13/2007    Qualifier: Diagnosis of  By: Gavin Potters MD, HEIDI    . SCHIZOPHRENIA 04/29/2006    Qualifier: Diagnosis of  By: Erenest Rasher MD, MADELEINE    . TREMOR, ESSENTIAL 05/28/2006    Qualifier: Diagnosis of  By: Erenest Rasher MD, MADELEINE    . Insomnia 08/11/2013  . Anemia   . Thyroid disease   . Hyperlipidemia   . Hypertension   . Depression   . CHF (congestive heart failure)     History reviewed. No pertinent past surgical history.  Melanie Roberson A. Mayford Knife, RD, LDN, CDE Pager: [phone] After hours Pager: [phone]

## 2014-04-18 NOTE — Progress Notes (Signed)
[facility] PROGRESS NOTE  Melanie MorrisonHelen M Roberson ZOX:[id number] DOB: [birth year] DOA: 04/16/2014 PCP: Kirt Boysarter, Monica, DO Interim summary: [name] 81 year old admitted for lower extremity cellulitis and acute kidney injury. She was startedo n IV hydration and her renal function improved.  Assessment/Plan: 1. Lower extremity cellulitis:  - improving. On IV antibiotics.   Acute kidney injury: Improving. Continue with IV FLUIDs for 24 hrs.   Hypertension; controlled.   Code Status: full code.  Family Communication: none atbedside Disposition Plan: d/c in am   Consultants:  Physical therapy  Procedures:  none  Antibiotics:  Clindamycin.  HPI/Subjective: Confused, reports persistent pain in the legs. Appetite okay.   Objective: Filed Vitals:   04/18/14 1454  BP: 154/92  Pulse:   Temp: 98.6 F (37 C)  Resp: 18    Intake/Output Summary (Last 24 hours) at 04/18/14 1934 Last data filed at 04/18/14 1400  Gross per 24 hour  Intake 2663.33 ml  Output      0 ml  Net 2663.33 ml   Filed Weights   04/16/14 2056 04/17/14 0117  Weight: 64.864 kg (143 lb) 65 kg (143 lb 4.8 oz)    Exam:   General:  Alert afebrile comfortable  Cardiovascular: s1s2  Respiratory: clear to auscultation bilaterally, no wheezing or rhonchi  Abdomen: soft nontender non distended bowel sounds heard  Musculoskeletal: leg cellulitis improving.   Data Reviewed: Basic Metabolic Panel:  Recent Labs Lab 04/16/14 2101 04/16/14 2111 04/16/14 2358 04/18/14 0805  NA 143 144 143 144  K 3.6 3.6 3.4* 4.1  CL 105 101 109 114*  CO2 32  --  30 24  GLUCOSE 175* 171* 105* 84  BUN 28* 30* 27* 17  CREATININE 1.24* 1.20* 1.03 0.81  CALCIUM 9.0  --  8.4 8.6  MG  --   --   --  1.8  PHOS  --   --   --  3.2   Liver Function Tests:  Recent Labs Lab 04/16/14 2358 04/18/14 0805  AST 22 28  ALT 12 9  ALKPHOS 77 73  BILITOT 0.6 0.9  PROT 4.7* 4.4*  ALBUMIN 2.7* 2.5*   No results for  input(s): LIPASE, AMYLASE in the last 168 hours.  Recent Labs Lab 04/16/14 2358  AMMONIA 35*   CBC:  Recent Labs Lab 04/16/14 2101 04/16/14 2111 04/16/14 2358 04/18/14 0805  WBC 4.0  --  4.1 3.5*  NEUTROABS  --   --  2.6 2.2  HGB 11.3* 11.6* 10.5* 10.5*  HCT 34.6* 34.0* 32.5* 30.8*  MCV 101.5*  --  102.2* 99.0  PLT 105*  --  101* PLATELET CLUMPS NOTED ON SMEAR, COUNT APPEARS DECREASED   Cardiac Enzymes: No results for input(s): CKTOTAL, CKMB, CKMBINDEX, TROPONINI in the last 168 hours. BNP (last 3 results) No results for input(s): BNP in the last 8760 hours.  ProBNP (last 3 results) No results for input(s): PROBNP in the last 8760 hours.  CBG: No results for input(s): GLUCAP in the last 168 hours.  Recent Results (from the past 240 hour(s))  Culture, blood (routine x 2)     Status: None (Preliminary result)   Collection Time: 04/16/14 11:45 PM  Result Value Ref Range Status   Specimen Description BLOOD LEFT ARM  Final   Special Requests BOTTLES DRAWN AEROBIC AND ANAEROBIC 10CC EA  Final   Culture   Final           BLOOD CULTURE RECEIVED NO GROWTH TO DATE CULTURE WILL  BE HELD FOR 5 DAYS BEFORE ISSUING A FINAL NEGATIVE REPORT Note: Culture results may be compromised due to an excessive volume of blood received in culture bottles. Performed at [facility]    Report Status PENDING  Incomplete  Culture, blood (routine x 2)     Status: None (Preliminary result)   Collection Time: 04/16/14 11:55 PM  Result Value Ref Range Status   Specimen Description BLOOD LEFT HAND  Final   Special Requests BOTTLES DRAWN AEROBIC ONLY 5CC  Final   Culture   Final           BLOOD CULTURE RECEIVED NO GROWTH TO DATE CULTURE WILL BE HELD FOR 5 DAYS BEFORE ISSUING A FINAL NEGATIVE REPORT Performed at [facility]    Report Status PENDING  Incomplete  MRSA PCR Screening     Status: Abnormal   Collection Time: 04/17/14  1:16 AM  Result Value Ref Range Status   MRSA by PCR  POSITIVE (A) NEGATIVE Final    Comment:        The GeneXpert MRSA Assay (FDA approved for NASAL specimens only), is one component of a comprehensive MRSA colonization surveillance program. It is not intended to diagnose MRSA infection nor to guide or monitor treatment for MRSA infections. RESULT CALLED TO, READ BACK BY AND VERIFIED WITH: LOWE,A RN [id number] AT 0445 SKEEN,P      Studies: Dg Chest 2 View  04/16/2014   CLINICAL DATA:  Altered mental status  EXAM: CHEST  2 VIEW  COMPARISON:  03/03/2009  FINDINGS: Cardiomediastinal silhouette is stable. No acute infiltrate or pleural effusion. No pulmonary edema. Stable compression deformity lower thoracic spine. Diffuse osteopenia. There is chronic elevation of the left hemidiaphragm.  IMPRESSION: No active cardiopulmonary disease.   Electronically Signed   By: Natasha Mead M.D.   On: 04/16/2014 22:21   Ct Head Wo Contrast  04/16/2014   CLINICAL DATA:  Dementia. Family reports patient is more lethargic unusual.  EXAM: CT HEAD WITHOUT CONTRAST  TECHNIQUE: Contiguous axial images were obtained from the base of the skull through the vertex without intravenous contrast.  COMPARISON:  05/09/2009  FINDINGS: There is no intracranial hemorrhage, mass or evidence of acute infarction. There is severe atrophy and microvascular ischemic disease. There is no extra-axial fluid collection. Compare to the prior study of 05/09/2009 there is mildly worsened generalized volume loss but no new focal abnormalities.  There is near complete opacification of the left maxillary sinus, new from 2011 but probably not acute given the bony sclerosis. No destructive bone changes are evident. The other paranasal sinuses are clear.  IMPRESSION: 1. No acute intracranial findings. 2. Severe volume loss and chronic microvascular ischemic changes 3. Left maxillary sinus disease, likely chronic.   Electronically Signed   By: Ellery Plunk M.D.   On: 04/16/2014 22:37    Scheduled  Meds: . Chlorhexidine Gluconate Cloth  6 each Topical Q0600  . clindamycin (CLEOCIN) IV  600 mg Intravenous 3 times per day  . DULoxetine  30 mg Oral Daily  . feeding supplement (ENSURE COMPLETE)  237 mL Oral Q24H  . ipratropium-albuterol  3 mL Nebulization TID  . lactulose  30 g Oral q morning - 10a  . levothyroxine  100 mcg Oral QAC breakfast  . mupirocin ointment  1 application Nasal BID  . polyethylene glycol  15 g Oral Daily  . potassium chloride  40 mEq Oral Once  . QUEtiapine  25 mg Oral QHS   Continuous Infusions: .  sodium chloride 100 mL/hr at 04/18/14 1524    Active Problems:   Schizophrenia, unspecified type   HTN (hypertension), benign   Depression due to dementia   Hypothyroidism   Encephalopathy   Cellulitis   AKI (acute kidney injury)    Time spent: 15 minutes.     [facility]  [facility] Pager [phone]. If 7PM-7AM, please contact night-coverage at [email or web address], password [facility] 04/18/2014, 7:34 PM  LOS: 1 day

## 2014-04-18 NOTE — Progress Notes (Signed)
Patient rings were found in bed- RN placed them in a patient cup and is in the first drawer near the bed. 7 rings and 2 earrings. 2 RN's verified.

## 2014-04-19 LAB — COMPREHENSIVE METABOLIC PANEL
ALT: 10 U/L (ref 0–35)
ANION GAP: 3 — AB (ref 5–15)
AST: 18 U/L (ref 0–37)
Albumin: 2.3 g/dL — ABNORMAL LOW (ref 3.5–5.2)
Alkaline Phosphatase: 70 U/L (ref 39–117)
BILIRUBIN TOTAL: 1 mg/dL (ref 0.3–1.2)
BUN: 12 mg/dL (ref 6–23)
CALCIUM: 8.5 mg/dL (ref 8.4–10.5)
CO2: 24 mmol/L (ref 19–32)
CREATININE: 0.82 mg/dL (ref 0.50–1.10)
Chloride: 115 mmol/L — ABNORMAL HIGH (ref 96–112)
GFR calc non Af Amer: 62 mL/min — ABNORMAL LOW (ref >90)
GFR, EST AFRICAN AMERICAN: 71 mL/min — AB (ref >90)
Glucose, Bld: 83 mg/dL (ref 70–99)
Potassium: 4.1 mmol/L (ref 3.5–5.1)
Sodium: 142 mmol/L (ref 135–145)
Total Protein: 4.4 g/dL — ABNORMAL LOW (ref 6.0–8.3)

## 2014-04-19 LAB — CBC WITH DIFFERENTIAL/PLATELET
Basophils Absolute: 0 10*3/uL (ref 0.0–0.1)
Basophils Relative: 0 % (ref 0–1)
Eosinophils Absolute: 0.2 10*3/uL (ref 0.0–0.7)
Eosinophils Relative: 8 % — ABNORMAL HIGH (ref 0–5)
HCT: 31 % — ABNORMAL LOW (ref 36.0–46.0)
Hemoglobin: 10.2 g/dL — ABNORMAL LOW (ref 12.0–15.0)
LYMPHS ABS: 0.4 10*3/uL — AB (ref 0.7–4.0)
Lymphocytes Relative: 15 % (ref 12–46)
MCH: 33.6 pg (ref 26.0–34.0)
MCHC: 32.9 g/dL (ref 30.0–36.0)
MCV: 102 fL — AB (ref 78.0–100.0)
Monocytes Absolute: 0.3 10*3/uL (ref 0.1–1.0)
Monocytes Relative: 10 % (ref 3–12)
NEUTROS PCT: 67 % (ref 43–77)
Neutro Abs: 1.9 10*3/uL (ref 1.7–7.7)
Platelets: 88 10*3/uL — ABNORMAL LOW (ref 150–400)
RBC: 3.04 MIL/uL — AB (ref 3.87–5.11)
RDW: 13.4 % (ref 11.5–15.5)
WBC: 2.9 10*3/uL — ABNORMAL LOW (ref 4.0–10.5)

## 2014-04-19 MED ORDER — DOXYCYCLINE HYCLATE 50 MG PO CAPS: 100.0000 mg | ORAL_CAPSULE | Freq: Two times a day (BID) | ORAL | Status: DC

## 2014-04-19 MED ORDER — ACETAMINOPHEN 325 MG PO TABS: 650.0000 mg | ORAL_TABLET | ORAL | Status: DC | PRN

## 2014-04-19 MED ORDER — SODIUM CHLORIDE 0.9 % IV SOLN
INTRAVENOUS | Status: DC
  Administered 2014-04-19 (×2): via INTRAVENOUS

## 2014-04-19 MED ORDER — ONDANSETRON HCL 4 MG/2ML IJ SOLN: 4.0000 mg | Freq: Four times a day (QID) | INTRAMUSCULAR | Status: DC | PRN

## 2014-04-19 MED ORDER — FUROSEMIDE 40 MG PO TABS: 20.0000 mg | ORAL_TABLET | Freq: Every day | ORAL | Status: DC

## 2014-04-19 MED ORDER — MUPIROCIN 2 % EX OINT: 1.0000 "application " | TOPICAL_OINTMENT | Freq: Two times a day (BID) | CUTANEOUS | Status: AC

## 2014-04-19 NOTE — Discharge Summary (Addendum)
Physician Discharge Summary  Melanie Roberson MVH:[id number] DOB: May 09, 1923 DOA: 04/16/2014  PCP: Melanie Boys, DO  Admit date: 04/16/2014 Discharge date: 04/19/2014  Time spent: 25 minutes  Recommendations for Outpatient Follow-up:  1. Follow up with PCP in one week.   Discharge Diagnoses:  Active Problems:   Schizophrenia, unspecified type   HTN (hypertension), benign   Depression due to dementia   Hypothyroidism   Encephalopathy   Cellulitis   AKI (acute kidney injury)   Discharge Condition: improved  Diet recommendation: low sodium diet  Filed Weights   04/16/14 2056 04/17/14 0117  Weight: 64.864 kg (143 lb) 65 kg (143 lb 4.8 oz)    History of present illness:  Melanie Roberson 81 year old admitted for lower extremity cellulitis and acute kidney injury. She was startedo n IV hydration and her renal function improved.   Hospital Course:  1. Lower extremity cellulitis: - improved. She completed 4 days of IV antibiotics, transitioned to po antibiotics to complete the course.   Acute kidney injury: Probably secondary to dehydration. Improved. Decreased the dose of lasix to 20 mg on discharge.   Hypertension: controlled.   Procedures:  none  Consultations:  Physical therapy  Discharge Exam: Filed Vitals:   04/19/14 0607  BP: 126/41  Pulse:   Temp: 99 F (37.2 C)  Resp: 20    General: alert afebrile comfortable Cardiovascular: s1s2 Respiratory: ctab  Discharge Instructions   Discharge Instructions    Diet - low sodium heart healthy    Complete by:  As directed      Discharge instructions    Complete by:  As directed   Follow up with PCP in one week.          Current Discharge Medication List    START taking these medications   Details  doxycycline (VIBRAMYCIN) 50 MG capsule Take 2 capsules (100 mg total) by mouth 2 (two) times daily. Qty: 12 capsule, Refills: 0    mupirocin ointment (BACTROBAN) 2 % Place 1 application into the nose 2 (two)  times daily. Qty: 22 g, Refills: 0      CONTINUE these medications which have CHANGED   Details  furosemide (LASIX) 40 MG tablet Take 0.5 tablets (20 mg total) by mouth daily. Qty: 30 tablet      CONTINUE these medications which have NOT CHANGED   Details  cyanocobalamin 500 MCG tablet Take 500 mcg by mouth daily. For B-12 deficiency    DULoxetine (CYMBALTA) 30 MG capsule Take 30 mg by mouth daily. For anxiety    guaiFENesin (ROBITUSSIN) 100 MG/5ML SOLN Take 10 mLs by mouth every 6 (six) hours as needed (cough).     ipratropium-albuterol (DUONEB) 0.5-2.5 (3) MG/3ML SOLN Take 3 mLs by nebulization 3 (three) times daily. And daily as needed    lactulose (CHRONULAC) 10 GM/15ML solution Take 30 g by mouth every morning.     levothyroxine (SYNTHROID, LEVOTHROID) 100 MCG tablet Take 100 mcg by mouth daily. For hypothyroidism    Multiple Vitamins-Minerals (MULTIVITAMIN WITH MINERALS) tablet Take 1 tablet by mouth daily.    polyethylene glycol powder (MIRALAX) powder Take 15 g by mouth daily. For constipation. Dispense 1 month supply.    polyvinyl alcohol-povidone (HYPOTEARS) 1.4-0.6 % ophthalmic solution Place 1 drop into both eyes 3 (three) times daily. For dry eyes    potassium chloride SA (K-DUR,KLOR-CON) 20 MEQ tablet Take 20 mEq by mouth daily. For heart disease    QUEtiapine (SEROQUEL) 25 MG tablet Take 25 mg  by mouth at bedtime.       Allergies  Allergen Reactions  . Erythromycin     Per MAR  . Penicillins     Per MAR  . Sulfa Antibiotics     Per MAR      The results of significant diagnostics from this hospitalization (including imaging, microbiology, ancillary and laboratory) are listed below for reference.    Significant Diagnostic Studies: Dg Chest 2 View  04/16/2014   CLINICAL DATA:  Altered mental status  EXAM: CHEST  2 VIEW  COMPARISON:  03/03/2009  FINDINGS: Cardiomediastinal silhouette is stable. No acute infiltrate or pleural effusion. No pulmonary edema.  Stable compression deformity lower thoracic spine. Diffuse osteopenia. There is chronic elevation of the left hemidiaphragm.  IMPRESSION: No active cardiopulmonary disease.   Electronically Signed   By: Melanie MeadLiviu  Roberson M.D.   On: 04/16/2014 22:21   Ct Head Wo Contrast  04/16/2014   CLINICAL DATA:  Dementia. Family reports patient is more lethargic unusual.  EXAM: CT HEAD WITHOUT CONTRAST  TECHNIQUE: Contiguous axial images were obtained from the base of the skull through the vertex without intravenous contrast.  COMPARISON:  05/09/2009  FINDINGS: There is no intracranial hemorrhage, mass or evidence of acute infarction. There is severe atrophy and microvascular ischemic disease. There is no extra-axial fluid collection. Compare to the prior study of 05/09/2009 there is mildly worsened generalized volume loss but no new focal abnormalities.  There is near complete opacification of the left maxillary sinus, new from 2011 but probably not acute given the bony sclerosis. No destructive bone changes are evident. The other paranasal sinuses are clear.  IMPRESSION: 1. No acute intracranial findings. 2. Severe volume loss and chronic microvascular ischemic changes 3. Left maxillary sinus disease, likely chronic.   Electronically Signed   By: Ellery Plunkaniel R Roberson M.D.   On: 04/16/2014 22:37    Microbiology: Recent Results (from the past 240 hour(s))  Culture, blood (routine x 2)     Status: None (Preliminary result)   Collection Time: 04/16/14 11:45 PM  Result Value Ref Range Status   Specimen Description BLOOD LEFT ARM  Final   Special Requests BOTTLES DRAWN AEROBIC AND ANAEROBIC 10CC EA  Final   Culture   Final           BLOOD CULTURE RECEIVED NO GROWTH TO DATE CULTURE WILL BE HELD FOR 5 DAYS BEFORE ISSUING A FINAL NEGATIVE REPORT Note: Culture results may be compromised due to an excessive volume of blood received in culture bottles. [facility]    Report Status PENDING  Incomplete   Culture, blood (routine x 2)     Status: None (Preliminary result)   Collection Time: 04/16/14 11:55 PM  Result Value Ref Range Status   Specimen Description BLOOD LEFT HAND  Final   Special Requests BOTTLES DRAWN AEROBIC ONLY 5CC  Final   Culture   Final           BLOOD CULTURE RECEIVED NO GROWTH TO DATE CULTURE WILL BE HELD FOR 5 DAYS BEFORE ISSUING A FINAL NEGATIVE REPORT [facility]formed at [facility]    Report Status PENDING  Incomplete  MRSA PCR Screening     Status: Abnormal   Collection Time: 04/17/14  1:16 AM  Result Value Ref Range Status   MRSA by PCR POSITIVE (A) NEGATIVE Final    Comment:        The GeneXpert MRSA Assay (FDA approved for NASAL specimens only), is one component of  a comprehensive MRSA colonization surveillance program. It is not intended to diagnose MRSA infection nor to guide or monitor treatment for MRSA infections. RESULT CALLED TO, READ BACK BY AND VERIFIED WITH: LOWE,A RN [id number] AT 0445 SKEEN,P      Labs: Basic Metabolic Panel:  Recent Labs Lab 04/16/14 2101 04/16/14 2111 04/16/14 2358 04/18/14 0805 04/19/14 0700  NA 143 144 143 144 142  K 3.6 3.6 3.4* 4.1 4.1  CL 105 101 109 114* 115*  CO2 32  --  GLUCOSE 175* 171* 105* 84 83  BUN 28* 30* 27* 17 12  CREATININE 1.24* 1.20* 1.03 0.81 0.82  CALCIUM 9.0  --  8.4 8.6 8.5  MG  --   --   --  1.8  --   PHOS  --   --   --  3.2  --    Liver Function Tests:  Recent Labs Lab 04/16/14 2358 04/18/14 0805 04/19/14 0700  AST ALT ALKPHOS 77 73 70  BILITOT 0.6 0.9 1.0  PROT 4.7* 4.4* 4.4*  ALBUMIN 2.7* 2.5* 2.3*   No results for input(s): LIPASE, AMYLASE in the last 168 hours.  Recent Labs Lab 04/16/14 2358  AMMONIA 35*   CBC:  Recent Labs Lab 04/16/14 2101 04/16/14 2111 04/16/14 2358 04/18/14 0805 04/19/14 0700  WBC 4.0  --  4.1 3.5* 2.9*  NEUTROABS  --   --  2.6 2.2 1.9  HGB 11.3* 11.6* 10.5* 10.5* 10.2*  HCT 34.6* 34.0* 32.5* 30.8*  31.0*  MCV 101.5*  --  102.2* 99.0 102.0*  PLT 105*  --  101* PLATELET CLUMPS NOTED ON SMEAR, COUNT APPEARS DECREASED PENDING   Cardiac Enzymes: No results for input(s): CKTOTAL, CKMB, CKMBINDEX, TROPONINI in the last 168 hours. BNP: BNP (last 3 results) No results for input(s): BNP in the last 8760 hours.  ProBNP (last 3 results) No results for input(s): PROBNP in the last 8760 hours.  CBG: No results for input(s): GLUCAP in the last 168 hours.     SignedKathlen Mody  [facility] 04/19/2014, 9:23 AM   Addendum: Patient could nto be discharged today. She also appears dehydrated and she was started on iv FLUIDS.  Plan to discharge her in am.   Kathlen Mody, MD [phone]

## 2014-04-19 NOTE — Clinical Social Work Note (Addendum)
Clinical Social Worker attempted to facilitate [facility], however patient will not discharge until tomorrow.  CM to notify MD and will [facility].  CSW left message with patient granddaughter to update on patient discharge plans.  CSW remains available for support and to facilitate patient discharge needs once medically ready.  Macario GoldsJesse Lyrah Bradt, [place] [phone]

## 2014-04-20 LAB — CBC WITH DIFFERENTIAL/PLATELET
BASOS PCT: 1 % (ref 0–1)
Basophils Absolute: 0 10*3/uL (ref 0.0–0.1)
EOS ABS: 0.3 10*3/uL (ref 0.0–0.7)
EOS PCT: 12 % — AB (ref 0–5)
HCT: 32.3 % — ABNORMAL LOW (ref 36.0–46.0)
Hemoglobin: 10.7 g/dL — ABNORMAL LOW (ref 12.0–15.0)
LYMPHS ABS: 0.5 10*3/uL — AB (ref 0.7–4.0)
Lymphocytes Relative: 19 % (ref 12–46)
MCH: 32.8 pg (ref 26.0–34.0)
MCHC: 33.1 g/dL (ref 30.0–36.0)
MCV: 99.1 fL (ref 78.0–100.0)
Monocytes Absolute: 0.2 10*3/uL (ref 0.1–1.0)
Monocytes Relative: 7 % (ref 3–12)
Neutro Abs: 1.7 10*3/uL (ref 1.7–7.7)
Neutrophils Relative %: 62 % (ref 43–77)
Platelets: 92 10*3/uL — ABNORMAL LOW (ref 150–400)
RBC: 3.26 MIL/uL — AB (ref 3.87–5.11)
RDW: 13.2 % (ref 11.5–15.5)
WBC: 2.8 10*3/uL — AB (ref 4.0–10.5)

## 2014-04-20 LAB — COMPREHENSIVE METABOLIC PANEL
ALK PHOS: 73 U/L (ref 39–117)
ALT: 14 U/L (ref 0–35)
ANION GAP: 3 — AB (ref 5–15)
AST: 25 U/L (ref 0–37)
Albumin: 2.4 g/dL — ABNORMAL LOW (ref 3.5–5.2)
BILIRUBIN TOTAL: 0.8 mg/dL (ref 0.3–1.2)
BUN: 9 mg/dL (ref 6–23)
CO2: 25 mmol/L (ref 19–32)
CREATININE: 0.79 mg/dL (ref 0.50–1.10)
Calcium: 8.7 mg/dL (ref 8.4–10.5)
Chloride: 113 mmol/L — ABNORMAL HIGH (ref 96–112)
GFR calc Af Amer: 83 mL/min — ABNORMAL LOW (ref >90)
GFR, EST NON AFRICAN AMERICAN: 72 mL/min — AB (ref >90)
GLUCOSE: 86 mg/dL (ref 70–99)
Potassium: 4 mmol/L (ref 3.5–5.1)
Sodium: 141 mmol/L (ref 135–145)
Total Protein: 4.3 g/dL — ABNORMAL LOW (ref 6.0–8.3)

## 2014-04-20 NOTE — Progress Notes (Signed)
[facility] to [facility](Diana Isaacson). SBAR: Clinical course and POC reviewed; All questions answered. PIV removed, no s/s of complication. Pt prepared for discharge, will be first available pick up.

## 2014-04-20 NOTE — Care Management Note (Signed)
    Page 1 of 1   04/20/2014     4:19:40 PM CARE MANAGEMENT NOTE 04/20/2014  Patient:  Melanie Roberson,Melanie Roberson   Account Number:  [id number]  Date Initiated:  04/20/2014  Documentation initiated by:  Letha CapeAYLOR,Sapna Padron  Subjective/Objective Assessment:   dx weakness  admit-     Action/Plan:   pt eval- rec snf   Anticipated DC Date:  04/20/2014   Anticipated DC Plan:  SKILLED NURSING FACILITY  In-house referral  Clinical Social Worker      DC Planning Services  CM consult      Choice offered to / List presented to:             Status of service:  Completed, signed off Medicare Important Message given?  YES (If response is "NO", the following Medicare IM given date fields will be blank) Date Medicare IM given:  04/20/2014 Medicare IM given by:  Letha CapeAYLOR,Mauro Arps Date Additional Medicare IM given:   Additional Medicare IM given by:    Discharge Disposition:  SKILLED NURSING FACILITY  Per UR Regulation:  Reviewed for med. necessity/level of care/duration of stay  If discussed at Long Length of Stay Meetings, dates discussed:    Comments:  04/20/14 1618 Letha Capeeborah Hildegarde Dunaway RN, BSN [phone] patient for dc to snf today.

## 2014-04-20 NOTE — Progress Notes (Signed)
PT note: Pt scheduled to return to SNF this afternoon. Will hold treatment today pending her transfer back to her SNF.  Will check back tomorrow in case d/c is called off for any reason. Clydie BraunKaren L. Katrinka BlazingSmith, [place] Pager [phone] 04/20/2014

## 2014-04-20 NOTE — Clinical Social Work Note (Signed)
Per MD patient [facility]. RN, patient/family ([facility]), and facility notified of patient's DC. RN given number for report. DC packet on patient's chart. Ambulance transport requested for patient. CSW signing off at this time.   Roddie McBryant Carol Theys MSW, [place], [place], [phone]

## 2014-04-20 NOTE — Discharge Summary (Signed)
Physician Discharge Summary  Elnora MorrisonHelen M Depaolo ZHY:[id number]:[id number] DOB: [birth year] DOA: 04/16/2014  PCP: Kirt Boysarter, Monica, DO  Admit date: 04/16/2014 Discharge date: 04/20/2014  Time spent: 25 minutes  Recommendations for Outpatient Follow-up:  1. Follow up with PCP in one week.   Discharge Diagnoses:  Active Problems:   Schizophrenia, unspecified type   HTN (hypertension), benign   Depression due to dementia   Hypothyroidism   Encephalopathy   Cellulitis   AKI (acute kidney injury)   Discharge Condition: improved  Diet recommendation: low sodium diet  Filed Weights   04/16/14 2056 04/17/14 0117  Weight: 64.864 kg (143 lb) 65 kg (143 lb 4.8 oz)    History of present illness:  [name] 81 year old admitted for lower extremity cellulitis and acute kidney injury. She was startedo n IV hydration and her renal function improved.   Hospital Course:  1. Lower extremity cellulitis: - improved. She completed 4 days of IV antibiotics, transitioned to po antibiotics to complete the course.   Acute kidney injury: Probably secondary to dehydration. Improved. Decreased the dose of lasix to 20 mg on discharge.   Hypertension: controlled.   Procedures:  none  Consultations:  Physical therapy  Discharge Exam: Filed Vitals:   04/20/14 1324  BP: 159/54  Pulse: 65  Temp: 97.9 F (36.6 C)  Resp: 18    General: alert afebrile comfortable Cardiovascular: s1s2 Respiratory: ctab  Discharge Instructions   Discharge Instructions    Diet - low sodium heart healthy    Complete by:  As directed      Discharge instructions    Complete by:  As directed   Follow up with PCP in one week.          Current Discharge Medication List    START taking these medications   Details  doxycycline (VIBRAMYCIN) 50 MG capsule Take 2 capsules (100 mg total) by mouth 2 (two) times daily. Qty: 12 capsule, Refills: 0    mupirocin ointment (BACTROBAN) 2 % Place 1 application into the nose 2  (two) times daily. Qty: 22 g, Refills: 0      CONTINUE these medications which have CHANGED   Details  furosemide (LASIX) 40 MG tablet Take 0.5 tablets (20 mg total) by mouth daily. Qty: 30 tablet      CONTINUE these medications which have NOT CHANGED   Details  cyanocobalamin 500 MCG tablet Take 500 mcg by mouth daily. For B-12 deficiency    DULoxetine (CYMBALTA) 30 MG capsule Take 30 mg by mouth daily. For anxiety    guaiFENesin (ROBITUSSIN) 100 MG/5ML SOLN Take 10 mLs by mouth every 6 (six) hours as needed (cough).     ipratropium-albuterol (DUONEB) 0.5-2.5 (3) MG/3ML SOLN Take 3 mLs by nebulization 3 (three) times daily. And daily as needed    lactulose (CHRONULAC) 10 GM/15ML solution Take 30 g by mouth every morning.     levothyroxine (SYNTHROID, LEVOTHROID) 100 MCG tablet Take 100 mcg by mouth daily. For hypothyroidism    Multiple Vitamins-Minerals (MULTIVITAMIN WITH MINERALS) tablet Take 1 tablet by mouth daily.    polyethylene glycol powder (MIRALAX) powder Take 15 g by mouth daily. For constipation. Dispense 1 month supply.    polyvinyl alcohol-povidone (HYPOTEARS) 1.4-0.6 % ophthalmic solution Place 1 drop into both eyes 3 (three) times daily. For dry eyes    potassium chloride SA (K-DUR,KLOR-CON) 20 MEQ tablet Take 20 mEq by mouth daily. For heart disease    QUEtiapine (SEROQUEL) 25 MG tablet Take 25 mg  by mouth at bedtime.       Allergies  Allergen Reactions  . Erythromycin     Per MAR  . Penicillins     Per MAR  . Sulfa Antibiotics     Per MAR   Follow-up Information    Follow up with Kirt Boys, DO. Schedule an appointment as soon as possible for a visit in 1 week.   Specialty:  Internal Medicine   Contact information:   [address] [place] [address] [phone]        The results of significant diagnostics from this hospitalization (including imaging, microbiology, ancillary and laboratory) are listed below for reference.     Significant Diagnostic Studies: Dg Chest 2 View  04/16/2014   CLINICAL DATA:  Altered mental status  EXAM: CHEST  2 VIEW  COMPARISON:  03/03/2009  FINDINGS: Cardiomediastinal silhouette is stable. No acute infiltrate or pleural effusion. No pulmonary edema. Stable compression deformity lower thoracic spine. Diffuse osteopenia. There is chronic elevation of the left hemidiaphragm.  IMPRESSION: No active cardiopulmonary disease.   Electronically Signed   By: Natasha Mead M.D.   On: 04/16/2014 22:21   Ct Head Wo Contrast  04/16/2014   CLINICAL DATA:  Dementia. Family reports patient is more lethargic unusual.  EXAM: CT HEAD WITHOUT CONTRAST  TECHNIQUE: Contiguous axial images were obtained from the base of the skull through the vertex without intravenous contrast.  COMPARISON:  05/09/2009  FINDINGS: There is no intracranial hemorrhage, mass or evidence of acute infarction. There is severe atrophy and microvascular ischemic disease. There is no extra-axial fluid collection. Compare to the prior study of 05/09/2009 there is mildly worsened generalized volume loss but no new focal abnormalities.  There is near complete opacification of the left maxillary sinus, new from 2011 but probably not acute given the bony sclerosis. No destructive bone changes are evident. The other paranasal sinuses are clear.  IMPRESSION: 1. No acute intracranial findings. 2. Severe volume loss and chronic microvascular ischemic changes 3. Left maxillary sinus disease, likely chronic.   Electronically Signed   By: Ellery Plunk M.D.   On: 04/16/2014 22:37    Microbiology: Recent Results (from the past 240 hour(s))  Culture, blood (routine x 2)     Status: None (Preliminary result)   Collection Time: 04/16/14 11:45 PM  Result Value Ref Range Status   Specimen Description BLOOD LEFT ARM  Final   Special Requests BOTTLES DRAWN AEROBIC AND ANAEROBIC 10CC EA  Final   Culture   Final           BLOOD CULTURE RECEIVED NO GROWTH TO  DATE CULTURE WILL BE HELD FOR 5 DAYS BEFORE ISSUING A FINAL NEGATIVE REPORT Note: Culture results may be compromised due to an excessive volume of blood received in culture bottles. Performed at [facility]    Report Status PENDING  Incomplete  Culture, blood (routine x 2)     Status: None (Preliminary result)   Collection Time: 04/16/14 11:55 PM  Result Value Ref Range Status   Specimen Description BLOOD LEFT HAND  Final   Special Requests BOTTLES DRAWN AEROBIC ONLY 5CC  Final   Culture   Final           BLOOD CULTURE RECEIVED NO GROWTH TO DATE CULTURE WILL BE HELD FOR 5 DAYS BEFORE ISSUING A FINAL NEGATIVE REPORT Performed at [facility]    Report Status PENDING  Incomplete  MRSA PCR Screening     Status: Abnormal  Collection Time: 04/17/14  1:16 AM  Result Value Ref Range Status   MRSA by PCR POSITIVE (A) NEGATIVE Final    Comment:        The GeneXpert MRSA Assay (FDA approved for NASAL specimens only), is one component of a comprehensive MRSA colonization surveillance program. It is not intended to diagnose MRSA infection nor to guide or monitor treatment for MRSA infections. RESULT CALLED TO, READ BACK BY AND VERIFIED WITH: LOWE,A RN [id number] AT 0445 SKEEN,P      Labs: Basic Metabolic Panel:  Recent Labs Lab 04/16/14 2101 04/16/14 2111 04/16/14 2358 04/18/14 0805 04/19/14 0700 04/20/14 0843  NA 143 144 143 144 142 141  K 3.6 3.6 3.4* 4.1 4.1 4.0  CL 105 101 109 114* 115* 113*  CO2 32  --  GLUCOSE 175* 171* 105* 84 83 86  BUN 28* 30* 27* CREATININE 1.24* 1.20* 1.03 0.81 0.82 0.79  CALCIUM 9.0  --  8.4 8.6 8.5 8.7  MG  --   --   --  1.8  --   --   PHOS  --   --   --  3.2  --   --    Liver Function Tests:  Recent Labs Lab 04/16/14 2358 04/18/14 0805 04/19/14 0700 04/20/14 0843  AST ALT ALKPHOS 77 73 70 73  BILITOT 0.6 0.9 1.0 0.8  PROT 4.7* 4.4* 4.4* 4.3*  ALBUMIN 2.7* 2.5* 2.3* 2.4*    No results for input(s): LIPASE, AMYLASE in the last 168 hours.  Recent Labs Lab 04/16/14 2358  AMMONIA 35*   CBC:  Recent Labs Lab 04/16/14 2101 04/16/14 2111 04/16/14 2358 04/18/14 0805 04/19/14 0700 04/20/14 0843  WBC 4.0  --  4.1 3.5* 2.9* 2.8*  NEUTROABS  --   --  2.6 2.2 1.9 1.7  HGB 11.3* 11.6* 10.5* 10.5* 10.2* 10.7*  HCT 34.6* 34.0* 32.5* 30.8* 31.0* 32.3*  MCV 101.5*  --  102.2* 99.0 102.0* 99.1  PLT 105*  --  101* PLATELET CLUMPS NOTED ON SMEAR, COUNT APPEARS DECREASED 88* 92*   Cardiac Enzymes: No results for input(s): CKTOTAL, CKMB, CKMBINDEX, TROPONINI in the last 168 hours. BNP: BNP (last 3 results) No results for input(s): BNP in the last 8760 hours.  ProBNP (last 3 results) No results for input(s): PROBNP in the last 8760 hours.  CBG: No results for input(s): GLUCAP in the last 168 hours.     SignedKathlen Mody  [facility] 04/20/2014, 3:32 PM

## 2014-04-23 ENCOUNTER — Non-Acute Institutional Stay (SKILLED_NURSING_FACILITY): Payer: Medicare Other | Admitting: Adult Health

## 2014-04-23 DIAGNOSIS — L03119 Cellulitis of unspecified part of limb: Secondary | ICD-10-CM

## 2014-04-23 DIAGNOSIS — E039 Hypothyroidism, unspecified: Secondary | ICD-10-CM

## 2014-04-23 DIAGNOSIS — F209 Schizophrenia, unspecified: Secondary | ICD-10-CM | POA: Diagnosis not present

## 2014-04-23 DIAGNOSIS — F329 Major depressive disorder, single episode, unspecified: Secondary | ICD-10-CM | POA: Diagnosis not present

## 2014-04-23 DIAGNOSIS — K5900 Constipation, unspecified: Secondary | ICD-10-CM

## 2014-04-23 DIAGNOSIS — F0151 Vascular dementia with behavioral disturbance: Secondary | ICD-10-CM

## 2014-04-23 DIAGNOSIS — F0280 Dementia in other diseases classified elsewhere without behavioral disturbance: Secondary | ICD-10-CM | POA: Diagnosis not present

## 2014-04-23 DIAGNOSIS — F01518 Vascular dementia, unspecified severity, with other behavioral disturbance: Secondary | ICD-10-CM

## 2014-04-23 DIAGNOSIS — F0393 Unspecified dementia, unspecified severity, with mood disturbance: Secondary | ICD-10-CM

## 2014-04-23 DIAGNOSIS — I509 Heart failure, unspecified: Secondary | ICD-10-CM | POA: Diagnosis not present

## 2014-04-23 LAB — CULTURE, BLOOD (ROUTINE X 2)
CULTURE: NO GROWTH
Culture: NO GROWTH

## 2014-04-24 ENCOUNTER — Non-Acute Institutional Stay (SKILLED_NURSING_FACILITY): Payer: Medicare Other | Admitting: Adult Health

## 2014-04-24 DIAGNOSIS — T148XXA Other injury of unspecified body region, initial encounter: Secondary | ICD-10-CM

## 2014-04-24 DIAGNOSIS — T148 Other injury of unspecified body region: Secondary | ICD-10-CM

## 2014-04-24 DIAGNOSIS — R609 Edema, unspecified: Secondary | ICD-10-CM

## 2014-04-30 ENCOUNTER — Encounter: Payer: Self-pay | Admitting: Adult Health

## 2014-04-30 NOTE — Progress Notes (Signed)
Patient ID: Melanie Roberson, female   DOB: April 30, 1923, 81 y.o.   MRN: [id number]  Melanie Roberson living [place]     Allergies  Allergen Reactions  . Erythromycin     Per MAR  . Penicillins     Per MAR  . Sulfa Antibiotics     Per [facility]       Chief Complaint  Patient presents with  . Acute Visit    staff concerns     HPI:  The nursing staff is concerned about her behaviors. She is not sleeping at night; she is more distressed and has more anxiety as well. She has recently lost her daughter and her roommate. She cannot fully participate in the hpi or ros; but did tell me that she feels nervous.    Past Medical History  Diagnosis Date  . MYALGIA 07/13/2007    Qualifier: Diagnosis of  By: Gavin Potters MD, HEIDI    . SCHIZOPHRENIA 04/29/2006    Qualifier: Diagnosis of  By: Erenest Rasher MD, MADELEINE    . TREMOR, ESSENTIAL 05/28/2006    Qualifier: Diagnosis of  By: Erenest Rasher MD, MADELEINE    . Insomnia 08/11/2013  . Anemia   . Thyroid disease   . Hyperlipidemia   . Hypertension   . Depression   . CHF (congestive heart failure)     No past surgical history on file.  VITAL SIGNS BP 122/70 mmHg  Pulse 80  Ht  (1.676 m)  Wt 142 lb (64.411 kg)  BMI 22.93 kg/m2  SpO2 93%   Outpatient Encounter Prescriptions as of 04/09/2014   Medication Sig  . cyanocobalamin 500 MCG tablet Take 500 mcg by mouth daily. For B-12 deficiency  . DULoxetine (CYMBALTA) 30 MG capsule Take 30 mg by mouth daily. For anxiety  . furosemide (LASIX) 40 MG tablet Take 20 mg by mouth daily.  Marland Kitchen guaiFENesin (ROBITUSSIN) 100 MG/5ML SOLN Take 10 mLs by mouth every 6 (six) hours as needed (cough).   Marland Kitchen ipratropium-albuterol (DUONEB) 0.5-2.5 (3) MG/3ML SOLN Take 3 mLs by nebulization 3 (three) times daily. And daily as needed  . lactulose (CHRONULAC) 10 GM/15ML solution Take 30 g by mouth every morning.   Marland Kitchen levothyroxine (SYNTHROID, LEVOTHROID) 100 MCG tablet Take 100 mcg by mouth daily. For hypothyroidism  . Multiple  Vitamins-Minerals (MULTIVITAMIN WITH MINERALS) tablet Take 1 tablet by mouth daily.  . polyethylene glycol powder (MIRALAX) powder Take 15 g by mouth daily. For constipation. Dispense 1 month supply.  . polyvinyl alcohol-povidone (HYPOTEARS) 1.4-0.6 % ophthalmic solution Place 1 drop into both eyes 3 (three) times daily. For dry eyes  . potassium chloride SA (K-DUR,KLOR-CON) 20 MEQ tablet Take 20 mEq by mouth daily. For heart disease     SIGNIFICANT DIAGNOSTIC EXAMS  LABS REVIEWED:   08-11-13: tsh 1.750 12-04-13: glucose 86; bun 22; creat 0.9; k+4.0; na++146     Review of Systems  Unable to perform ROS    Physical Exam Constitutional: She appears well-developed and well-nourished. No distress.  Neck: Neck supple. No JVD present. No thyromegaly present.  Cardiovascular: Normal rate, regular rhythm and intact distal pulses.   Respiratory: Effort normal and breath sounds normal. No respiratory distress.  GI: Soft. Bowel sounds are normal. She exhibits no distension.  Musculoskeletal: She exhibits edema.  Is able to move all extremities Has 1+ lower extremity edema   Neurological: She is alert.  Skin: Skin is warm and dry. She is not diaphoretic.    ASSESSMENT/ PLAN:  1. Depression due to  dementia: will increase her cymbalta to 60 mg daily and will begin trazodone 12.5 mg nightly for sleep and will monitor her status.    Melanie Innocenteborah Nation Cradle NP [facility]  Contact [phone] Monday through Friday 8am- 5pm  After hours call [phone]

## 2014-04-30 NOTE — Progress Notes (Signed)
Patient ID: Melanie Roberson, female   DOB: [birth year], 81 y.o.   MRN: [id number]  Melanie Roberson living [place]     Allergies  Allergen Reactions  . Erythromycin     Per MAR  . Penicillins     Per MAR  . Sulfa Antibiotics     Per [facility]       Chief Complaint  Patient presents with  . Hospitalization Follow-up    HPI:  She has been hospitalized for cellulitis of her lower extremities and acute renal failure. She is a long term resident of this facility. She will need to complete her abt; her lasix was lowered to 20 mg daily. She cannot fully participate in the hpi or ros; but states that she is feeling good. There are no nursing concerns at this time.    Past Medical History  Diagnosis Date  . MYALGIA 07/13/2007    Qualifier: Diagnosis of  By: Gavin Potters MD, HEIDI    . SCHIZOPHRENIA 04/29/2006    Qualifier: Diagnosis of  By: Erenest Rasher MD, MADELEINE    . TREMOR, ESSENTIAL 05/28/2006    Qualifier: Diagnosis of  By: Erenest Rasher MD, MADELEINE    . Insomnia 08/11/2013  . Anemia   . Thyroid disease   . Hyperlipidemia   . Hypertension   . Depression   . CHF (congestive heart failure)     No past surgical history on file.  VITAL SIGNS BP 126/55 mmHg  Pulse 88  Ht  (1.676 m)  Wt 148 lb (67.132 kg)  BMI 23.90 kg/m2   Outpatient Encounter Prescriptions as of 04/23/2014  Medication Sig  . cyanocobalamin 500 MCG tablet Take 500 mcg by mouth daily. For B-12 deficiency  . doxycycline (VIBRAMYCIN) 50 MG capsule Take 2 capsules (100 mg total) by mouth 2 (two) times daily.  . DULoxetine (CYMBALTA) 30 MG capsule Take 30 mg by mouth daily. For anxiety  . furosemide (LASIX) 40 MG tablet Take 0.5 tablets (20 mg total) by mouth daily.  Marland Kitchen guaiFENesin (ROBITUSSIN) 100 MG/5ML SOLN Take 10 mLs by mouth every 6 (six) hours as needed (cough).   Marland Kitchen ipratropium-albuterol (DUONEB) 0.5-2.5 (3) MG/3ML SOLN Take 3 mLs by nebulization 3 (three) times daily. And daily as needed  . lactulose (CHRONULAC) 10  GM/15ML solution Take 30 g by mouth every morning.   Marland Kitchen levothyroxine (SYNTHROID, LEVOTHROID) 100 MCG tablet Take 100 mcg by mouth daily. For hypothyroidism  . Multiple Vitamins-Minerals (MULTIVITAMIN WITH MINERALS) tablet Take 1 tablet by mouth daily.  . polyethylene glycol powder (MIRALAX) powder Take 15 g by mouth daily. For constipation. Dispense 1 month supply.  . polyvinyl alcohol-povidone (HYPOTEARS) 1.4-0.6 % ophthalmic solution Place 1 drop into both eyes 3 (three) times daily. For dry eyes  . potassium chloride SA (K-DUR,KLOR-CON) 20 MEQ tablet Take 20 mEq by mouth daily. For heart disease  . QUEtiapine (SEROQUEL) 25 MG tablet Take 25 mg by mouth at bedtime.  . [EXPIRED] mupirocin ointment (BACTROBAN) 2 % Place 1 application into the nose 2 (two) times daily.     SIGNIFICANT DIAGNOSTIC EXAMS  04-16-14: ct of head: 1. No acute intracranial findings. 2. Severe volume loss and chronic microvascular ischemic changes 3. Left maxillary sinus disease, likely chronic.  04-16-14: chest x-ray: No active cardiopulmonary disease.  04-17-14: bilateral lower extremities: - No evidence of deep vein thrombosis involving the visualized veins of the right lower extremity. - No evidence of deep vein thrombosis involving the visualized veins of the left lower extremity. - No  evidence of Baker&'s cyst on the right or left.    LABS REVIEWED:   08-11-13: tsh 1.750 12-04-13: glucose 86; bun 22; creat 0.9; k+4.0; na++146  02-21-14: glucose 85; bun 20; creat 0.9; k+4.0; na++ 143; tsh 1.78 04-16-14: wbc 4.0; hgb 11.3; hct 34.6; mcv 101.5; plt 105; glucose 175; bun 28; creat 1.24; k+3.6; na++143; liver normal albumin 2.7 04-20-14: wbc 2.8; hgb 10.7; hct 32.3; mcv 991.; lt 92; glucose 86; bun 9; creat 0.79; k+4.0; na++141; liver normal albumin 2.7        Review of Systems  Unable to perform ROS    Physical Exam Constitutional: She appears well-developed and well-nourished. No distress.  Neck: Neck  supple. No JVD present. No thyromegaly present.  Cardiovascular: Normal rate, regular rhythm and intact distal pulses.   Respiratory: Effort normal and breath sounds normal. No respiratory distress.  GI: Soft. Bowel sounds are normal. She exhibits no distension.  Musculoskeletal: She exhibits edema.  Is able to move all extremities Has 2+ lower extremity edema   Neurological: She is alert.  Skin: Skin is warm and dry. She is not diaphoretic. She has numerous skin tears present.      ASSESSMENT/ PLAN:  1. Cellulitis: will have complete her 6 days of doxycycline 100 mg twice daily and will continue to monitor her status.   2. Hypothyroidism: is stable will continue synthroid 100 mcg daily and will monitor tsh is 1.78  3. CHF: is presently stable will continue her lasix 20 mg daily with k+ 20 meq daily and will monitor   4. Constipation: will continue miralax daily and lactulose 30 cc daily   5. Schizophrenia: is emotionally stable at this time will continue seroquel 25 mg nightly will monitor   6. Depression: will continue her cymbalta 30 mg daily and will monitor her status.     Time spent with patient 50 minutes.     Synthia Innocenteborah Doylene Splinter NP [facility]  Contact [phone] Monday through Friday 8am- 5pm  After hours call [phone]

## 2014-04-30 NOTE — Progress Notes (Signed)
Patient ID: Melanie PiperHelen M Mino, female   DOB: 12/30/1923, 81 y.o.   MRN: [id number]  [name] living [place]     Allergies  Allergen Reactions  . Erythromycin     Per MAR  . Penicillins     Per MAR  . [facility]ulfa Antibiotics     [facility]       Chief Complaint  Patient presents with  . Acute Visit    concerns about skin     HPI:  Her family has expressed concerns about the status of her skin. She has several skin tears on her arms and has a small hematoma on her right lower leg where she has bumped her leg. Her skin if frail and loose. She has edema present especially in her lower extremities; which makes her more prone to skin tears. She has a low albumin level which has a negative impact on skin integrity as well. The areas are being treated by facility protocols there are no signs of infection present. She is not complaining of pain; but cannot fully participate in the hpi or ros.    Past Medical History  Diagnosis Date  . MYALGIA 07/13/2007    Qualifier: Diagnosis of  By: Gavin PottersGRANDIS MD, HEIDI    . SCHIZOPHRENIA 04/29/2006    Qualifier: Diagnosis of  By: Erenest RasherVANSTORY MD, MADELEINE    . TREMOR, ESSENTIAL 05/28/2006    Qualifier: Diagnosis of  By: Erenest RasherVANSTORY MD, MADELEINE    . Insomnia 08/11/2013  . Anemia   . Thyroid disease   . Hyperlipidemia   . Hypertension   . Depression   . CHF (congestive heart failure)     No past surgical history on file.  VITAL SIGNS BP 126/66 mmHg  Pulse 88  Ht 5\' 6"  (1.676 m)  Wt 148 lb (67.132 kg)  BMI 23.90 kg/m2   Outpatient Encounter Prescriptions as of 04/24/2014  Medication Sig  . cyanocobalamin 500 MCG tablet Take 500 mcg by mouth daily. For B-12 deficiency  . doxycycline (VIBRAMYCIN) 50 MG capsule Take 2 capsules (100 mg total) by mouth 2 (two) times daily.  . DULoxetine (CYMBALTA) 30 MG capsule Take 30 mg by mouth daily. For anxiety  . furosemide (LASIX) 40 MG tablet Take 0.5 tablets (20 mg total) by mouth daily.  Marland Kitchen. guaiFENesin (ROBITUSSIN)  100 MG/5ML SOLN Take 10 mLs by mouth every 6 (six) hours as needed (cough).   Marland Kitchen. ipratropium-albuterol (DUONEB) 0.5-2.5 (3) MG/3ML SOLN Take 3 mLs by nebulization 3 (three) times daily. And daily as needed  . lactulose (CHRONULAC) 10 GM/15ML solution Take 30 g by mouth every morning.   Marland Kitchen. levothyroxine (SYNTHROID, LEVOTHROID) 100 MCG tablet Take 100 mcg by mouth daily. For hypothyroidism  . Multiple Vitamins-Minerals (MULTIVITAMIN WITH MINERALS) tablet Take 1 tablet by mouth daily.  . polyethylene glycol powder (MIRALAX) powder Take 15 g by mouth daily. For constipation. Dispense 1 month supply.  . polyvinyl alcohol-povidone (HYPOTEARS) 1.4-0.6 % ophthalmic solution Place 1 drop into both eyes 3 (three) times daily. For dry eyes  . potassium chloride SA (K-DUR,KLOR-CON) 20 MEQ tablet Take 20 mEq by mouth daily. For heart disease  . QUEtiapine (SEROQUEL) 25 MG tablet Take 25 mg by mouth at bedtime.     SIGNIFICANT DIAGNOSTIC EXAMS  04-16-14: ct of head: 1. No acute intracranial findings. 2. Severe volume loss and chronic microvascular ischemic changes 3. Left maxillary sinus disease, likely chronic.  04-16-14: chest x-ray: No active cardiopulmonary disease.  04-17-14: bilateral lower extremities: - No evidence  of deep vein thrombosis involving the visualized veins of the right lower extremity. - No evidence of deep vein thrombosis involving the visualized veins of the left lower extremity. - No evidence of Baker&'s cyst on the right or left.    LABS REVIEWED:   08-11-13: tsh 1.750 12-04-13: glucose 86; bun 22; creat 0.9; k+4.0; na++146  02-21-14: glucose 85; bun 20; creat 0.9; k+4.0; na++ 143; tsh 1.78 04-16-14: wbc 4.0; hgb 11.3; hct 34.6; mcv 101.5; plt 105; glucose 175; bun 28; creat 1.24; k+3.6; na++143; liver normal albumin 2.7 04-20-14: wbc 2.8; hgb 10.7; hct 32.3; mcv 991.; lt 92; glucose 86; bun 9; creat 0.79; k+4.0; na++141; liver normal albumin 2.7         Review of Systems    Unable to perform ROS    Physical Exam Constitutional: She appears well-developed and well-nourished. No distress.  Neck: Neck supple. No JVD present. No thyromegaly present.  Cardiovascular: Normal rate, regular rhythm and intact distal pulses.   Respiratory: Effort normal and breath sounds normal. No respiratory distress.  GI: Soft. Bowel sounds are normal. She exhibits no distension.  Musculoskeletal: She exhibits edema.  Is able to move all extremities Has 3+ lower extremity edema   Neurological: She is alert.  Skin: Skin is warm and dry. She is not diaphoretic.  She has 3 skin tears on right arm 1 small skin tear on left hand 1 small hematoma on right lower leg without signs of infection present.       ASSESSMENT/ PLAN:  1. Edema:  2. Skin tears Will continue the treatment to her skin tears as per facility protocol. Will have her wear ted hose daily to help with her edema. Her lasix was lowered while she was hospitalized; will need to continue to monitor her status.     Synthia Innocent NP [facility]  Contact [phone] Monday through Friday 8am- 5pm  After hours call [phone]

## 2014-05-01 ENCOUNTER — Encounter: Payer: Self-pay | Admitting: Internal Medicine

## 2014-05-01 ENCOUNTER — Non-Acute Institutional Stay (SKILLED_NURSING_FACILITY): Payer: Medicare Other | Admitting: Internal Medicine

## 2014-05-01 DIAGNOSIS — F209 Schizophrenia, unspecified: Secondary | ICD-10-CM

## 2014-05-01 DIAGNOSIS — I10 Essential (primary) hypertension: Secondary | ICD-10-CM | POA: Diagnosis not present

## 2014-05-01 DIAGNOSIS — L03119 Cellulitis of unspecified part of limb: Secondary | ICD-10-CM | POA: Diagnosis not present

## 2014-05-01 DIAGNOSIS — F01518 Vascular dementia, unspecified severity, with other behavioral disturbance: Secondary | ICD-10-CM

## 2014-05-01 DIAGNOSIS — F0151 Vascular dementia with behavioral disturbance: Secondary | ICD-10-CM

## 2014-05-01 DIAGNOSIS — E039 Hypothyroidism, unspecified: Secondary | ICD-10-CM | POA: Diagnosis not present

## 2014-05-01 DIAGNOSIS — I509 Heart failure, unspecified: Secondary | ICD-10-CM

## 2014-05-01 NOTE — Progress Notes (Signed)
Patient ID: Melanie Roberson, female   DOB: Oct 02, 1923, 81 y.o.   MRN: [id number]    HISTORY AND PHYSICAL  Location:  [facility]    Place of Service: SNF [phone])   Extended Emergency Contact Information Primary Emergency Contact: Lars Pinks [place] Home Phone: [phone] Relation: None Secondary Emergency Contact: [facility] Address: [address]          [place] Home Phone: [phone] Relation: None  Advanced Directive information  FULL CODE  Chief Complaint  Patient presents with  . Readmit To SNF    LE cellulitis, HTN, depression, dementia, hypothyroidism, acute renal insufficiency, schizophrenia, encephalopathy    HPI:  81 yo female seen today for readmission into SNF for above. She is currently taking doxycycline for LE cellulitis as well as using mupirocin ointment. Her MRSA screen was (+) in the hospital. No f/c/ no leg pain. No nursing issues.   Her dementia is stable. She takes seroquel and cymbalta for schizophrenia and depression.  Heart failure is controlled with lasix and potassium supplement.  BP controlled on lasix.  Thyroid stable on levothyroxine.  Past Medical History  Diagnosis Date  . MYALGIA 07/13/2007    Qualifier: Diagnosis of  By: Gavin Potters MD, HEIDI    . SCHIZOPHRENIA 04/29/2006    Qualifier: Diagnosis of  By: Erenest Rasher MD, MADELEINE    . TREMOR, ESSENTIAL 05/28/2006    Qualifier: Diagnosis of  By: Erenest Rasher MD, MADELEINE    . Insomnia 08/11/2013  . Anemia   . Thyroid disease   . Hyperlipidemia   . Hypertension   . Depression   . CHF (congestive heart failure)     No past surgical history on file.  Patient Care Team: Kirt Boys, DO as PCP - General (Internal Medicine)  History   Social History  . Marital Status: Widowed    Spouse Name: N/A  . Number of Children: N/A  . Years of Education: N/A   Occupational History  . Not on file.   Social History Main Topics  .  Smoking status: Former Games developer  . Smokeless tobacco: Current User    Types: Snuff  . Alcohol Use: No  . Drug Use: No  . Sexual Activity: No   Other Topics Concern  . Not on file   Social History Narrative     reports that she has quit smoking. Her smokeless tobacco use includes Snuff. She reports that she does not drink alcohol or use illicit drugs.  No family history on file. No family status information on file.    Immunization History  Administered Date(s) Administered  . Influenza Whole 01/18/2007, 11/17/2007, 03/03/2009  . Influenza-Unspecified 12/14/2013  . PPD Test 12/23/2009, 10/09/2010  . Pneumococcal Conjugate-13 03/03/2009, 12/19/2013  . Pneumococcal Polysaccharide-23 03/02/2006  . Td 11/17/2007    Allergies  Allergen Reactions  . Erythromycin     Per MAR  . Penicillins     Per MAR  . Sulfa Antibiotics     Per MAR    Medications: Patient's Medications  New Prescriptions   No medications on file  Previous Medications   CYANOCOBALAMIN 500 MCG TABLET    Take 500 mcg by mouth daily. For B-12 deficiency   DOXYCYCLINE (VIBRAMYCIN) 50 MG CAPSULE    Take 2 capsules (100 mg total) by mouth 2 (two) times daily.   DULOXETINE (CYMBALTA) 30 MG CAPSULE    Take 30 mg by mouth daily. For anxiety   FUROSEMIDE (LASIX) 40 MG  TABLET    Take 0.5 tablets (20 mg total) by mouth daily.   GUAIFENESIN (ROBITUSSIN) 100 MG/5ML SOLN    Take 10 mLs by mouth every 6 (six) hours as needed (cough).    IPRATROPIUM-ALBUTEROL (DUONEB) 0.5-2.5 (3) MG/3ML SOLN    Take 3 mLs by nebulization 3 (three) times daily. And daily as needed   LACTULOSE (CHRONULAC) 10 GM/15ML SOLUTION    Take 30 g by mouth every morning.    LEVOTHYROXINE (SYNTHROID, LEVOTHROID) 100 MCG TABLET    Take 100 mcg by mouth daily. For hypothyroidism   MULTIPLE VITAMINS-MINERALS (MULTIVITAMIN WITH MINERALS) TABLET    Take 1 tablet by mouth daily.   POLYETHYLENE GLYCOL POWDER (MIRALAX) POWDER    Take 15 g by mouth daily. For  constipation. Dispense 1 month supply.   POLYVINYL ALCOHOL-POVIDONE (HYPOTEARS) 1.4-0.6 % OPHTHALMIC SOLUTION    Place 1 drop into both eyes 3 (three) times daily. For dry eyes   POTASSIUM CHLORIDE SA (K-DUR,KLOR-CON) 20 MEQ TABLET    Take 20 mEq by mouth daily. For heart disease   QUETIAPINE (SEROQUEL) 25 MG TABLET    Take 25 mg by mouth at bedtime.  Modified Medications   No medications on file  Discontinued Medications   No medications on file    Review of Systems  Unable to perform ROS: Dementia    Filed Vitals:   05/01/14 1627  BP: 112/70  Pulse: 81  Temp: 97.2 F (36.2 C)  Weight: 148 lb (67.132 kg)  SpO2: 87%   Body mass index is 23.9 kg/(m^2).  Physical Exam  Constitutional:  Frail appearing in NAD  Neck: Neck supple. No tracheal deviation present. No thyromegaly present.  Cardiovascular: Normal rate, regular rhythm, normal heart sounds and intact distal pulses.  Exam reveals no gallop and no friction rub.   No murmur heard. R>L LE edema b/l. no calf TTP. No carotid bruit b/l  Pulmonary/Chest: Effort normal and breath sounds normal. No stridor. No respiratory distress. She has no wheezes. She has no rales.  Abdominal: Soft. Bowel sounds are normal. She exhibits no distension and no mass. There is no tenderness. There is no rebound and no guarding.  Lymphadenopathy:    She has no cervical adenopathy.  Neurological: She is alert.  Skin: Skin is warm and dry. No rash noted. No erythema.  Psychiatric: Her mood appears anxious.     Labs reviewed: Admission on 04/16/2014, Discharged on 04/20/2014  No results displayed because visit has over 200 results.     CBC Latest Ref Rng 04/20/2014 04/19/2014 04/18/2014  WBC 4.0 - 10.5 K/uL 2.8(L) 2.9(L) 3.5(L)  Hemoglobin 12.0 - 15.0 g/dL 10.7(L) 10.2(L) 10.5(L)  Hematocrit 36.0 - 46.0 % 32.3(L) 31.0(L) 30.8(L)  Platelets 150 - 400 K/uL 92(L) 88(L) PLATELET CLUMPS NOTED ON SMEAR, COUNT APPEARS DECREASED    CMP Latest Ref Rng  04/20/2014 04/19/2014 04/18/2014  Glucose 70 - 99 mg/dL 86 83 84  BUN 6 - 23 mg/dL 9 12 17   Creatinine 0.50 - 1.10 mg/dL 1.610.79 0.960.82 0.450.81  Sodium 135 - 145 mmol/L 141 142 144  Potassium 3.5 - 5.1 mmol/L 4.0 4.1 4.1  Chloride 96 - 112 mmol/L 113(H) 115(H) 114(H)  CO2 19 - 32 mmol/L 25 24 24   Calcium 8.4 - 10.5 mg/dL 8.7 8.5 8.6  Total Protein 6.0 - 8.3 g/dL 4.3(L) 4.4(L) 4.4(L)  Total Bilirubin 0.3 - 1.2 mg/dL 0.8 1.0 0.9  Alkaline Phos 39 - 117 U/L 73 70 73  AST 0 - 37  U/L ALT 0 - 35 U/L Hospital records reviewed - blood cx neg. (+) MRSA. Head CT showed no acute process but chronic left maxillary sinusitis noted along with severe volume loss and chronic microvascular ischemic changes   Assessment/Plan   ICD-9-CM ICD-10-CM   1. Cellulitis of lower extremity, unspecified laterality - on doxy 682.6 L03.119   2. HTN (hypertension), benign - stable on lasix 401.1 I10   3. Vascular dementia with behavior disturbance - stable on seroquel and cymbalta 290.40 F01.51   4. Hypothyroidism, unspecified hypothyroidism type - cont levothyroxine 244.9 E03.9   5. Chronic congestive heart failure, unspecified congestive heart failure type - stable on lasix/K supplement 428.0 I50.9   6. Schizophrenia, unspecified type - cont seroquel  295.90 F20.9     --finish doxycycline  --keep legs elevated when seated. Wear TED stockings daily and remove qhs.  --continue current medications as ordered  --GOAL: complete abx. Short term rehab for deconditioning. She is a long term resident. Communicated with pt and nursing.  Chardae Mulkern S. Ancil Linsey  [facility] [address] [place], [place] [address] [phone] Office (Wednesdays and Fridays 8 AM - 5 PM) [phone] Cell (Monday-Friday 8 AM - 5 PM)

## 2014-05-01 NOTE — Progress Notes (Signed)
Patient ID: Luan PullingHelen Leiphart, female   DOB: 1924-01-04, 81 y.o.   MRN: [id number] NOTE CREATED IN ERROR  HISTORY AND PHYSICAL  [facility]    Place of Service: SNF (31)   No emergency contact information on file.  Advanced Directive information    Chief Complaint  Patient presents with  . Readmit To SNF    HPI:   No past medical history on file.  No past surgical history on file.  No care team member to display  History   Social History  . Marital Status: N/A    Spouse Name: N/A  . Number of Children: N/A  . Years of Education: N/A   Occupational History  . Not on file.   Social History Main Topics  . Smoking status: Not on file  . Smokeless tobacco: Not on file  . Alcohol Use: Not on file  . Drug Use: Not on file  . Sexual Activity: Not on file   Other Topics Concern  . Not on file   Social History Narrative  . No narrative on file     has no tobacco, alcohol, and drug history on file.  No family history on file. No family status information on file.     There is no immunization history on file for this patient.  Allergies not on file  Medications: Patient's Medications   No medications on file    Review of Systems   There is no height on file to calculate BMI.  Physical Exam   Labs reviewed: No results found for any previous visit.  No results found.   Assessment/Plan

## 2014-05-29 ENCOUNTER — Non-Acute Institutional Stay (SKILLED_NURSING_FACILITY): Payer: Medicare Other | Admitting: Adult Health

## 2014-05-29 DIAGNOSIS — R609 Edema, unspecified: Secondary | ICD-10-CM

## 2014-05-29 DIAGNOSIS — L03116 Cellulitis of left lower limb: Secondary | ICD-10-CM | POA: Diagnosis not present

## 2014-07-02 ENCOUNTER — Non-Acute Institutional Stay (SKILLED_NURSING_FACILITY): Payer: Medicare Other | Admitting: Adult Health

## 2014-07-02 DIAGNOSIS — E039 Hypothyroidism, unspecified: Secondary | ICD-10-CM

## 2014-07-02 DIAGNOSIS — K5900 Constipation, unspecified: Secondary | ICD-10-CM

## 2014-07-02 DIAGNOSIS — I10 Essential (primary) hypertension: Secondary | ICD-10-CM

## 2014-07-02 DIAGNOSIS — I509 Heart failure, unspecified: Secondary | ICD-10-CM | POA: Diagnosis not present

## 2014-07-02 DIAGNOSIS — L03116 Cellulitis of left lower limb: Secondary | ICD-10-CM

## 2014-07-13 ENCOUNTER — Non-Acute Institutional Stay (SKILLED_NURSING_FACILITY): Payer: Medicare Other | Admitting: Adult Health

## 2014-07-13 DIAGNOSIS — I509 Heart failure, unspecified: Secondary | ICD-10-CM | POA: Diagnosis not present

## 2014-07-30 ENCOUNTER — Encounter: Payer: Self-pay | Admitting: Adult Health

## 2014-07-30 DIAGNOSIS — R609 Edema, unspecified: Secondary | ICD-10-CM | POA: Insufficient documentation

## 2014-07-30 NOTE — Progress Notes (Signed)
Patient ID: Melanie Roberson, female   DOB: 1923-05-17, 81 y.o.   MRN: [id number]  Melanie Roberson living [place]     Allergies  Allergen Reactions  . Erythromycin     Per MAR  . Penicillins     Per MAR  . Sulfa Antibiotics     Per [facility]       Chief Complaint  Patient presents with  . Acute Visit    edema    HPI:  She continues to have lower extremity edema present with the left worse than right. Her left lower extremity is red and inflamed. She is currently taking lasix 20 mg daily. She is unable to fully participate in the hpi or ros.   Past Medical History  Diagnosis Date  . MYALGIA 07/13/2007    Qualifier: Diagnosis of  By: Melanie Potters MD, Melanie Roberson    . SCHIZOPHRENIA 04/29/2006    Qualifier: Diagnosis of  By: Melanie Rasher MD, Melanie Roberson    . TREMOR, ESSENTIAL 05/28/2006    Qualifier: Diagnosis of  By: Melanie Rasher MD, Melanie Roberson    . Insomnia 08/11/2013  . Anemia   . Thyroid disease   . Hyperlipidemia   . Hypertension   . Depression   . CHF (congestive heart failure)     No past surgical history on file.  VITAL SIGNS BP 98/76 mmHg  Pulse 70  Ht  (1.676 m)  Wt 143 lb (64.864 kg)  BMI 23.09 kg/m2   Outpatient Encounter Prescriptions as of 05/29/2014  Medication Sig  . cyanocobalamin 500 MCG tablet Take 500 mcg by mouth daily. For B-12 deficiency  . DULoxetine (CYMBALTA) 30 MG capsule Take 30 mg by mouth daily. For anxiety  . furosemide (LASIX) 40 MG tablet Take 0.5 tablets (20 mg total) by mouth daily.  Marland Kitchen guaiFENesin (ROBITUSSIN) 100 MG/5ML SOLN Take 10 mLs by mouth every 6 (six) hours as needed (cough).   Marland Kitchen ipratropium-albuterol (DUONEB) 0.5-2.5 (3) MG/3ML SOLN Take 3 mLs by nebulization 3 (three) times daily. And daily as needed  . lactulose (CHRONULAC) 10 GM/15ML solution Take 30 g by mouth every morning.   Marland Kitchen levothyroxine (SYNTHROID, LEVOTHROID) 100 MCG tablet Take 100 mcg by mouth daily. For hypothyroidism  . Multiple Vitamins-Minerals (MULTIVITAMIN WITH MINERALS) tablet  Take 1 tablet by mouth daily.  . polyethylene glycol powder (MIRALAX) powder Take 15 g by mouth daily. For constipation. Dispense 1 month supply.  . polyvinyl alcohol-povidone (HYPOTEARS) 1.4-0.6 % ophthalmic solution Place 1 drop into both eyes 3 (three) times daily. For dry eyes  . potassium chloride SA (K-DUR,KLOR-CON) 20 MEQ tablet Take 20 mEq by mouth daily. For heart disease  . QUEtiapine (SEROQUEL) 25 MG tablet Take 25 mg by mouth at bedtime.      SIGNIFICANT DIAGNOSTIC EXAMS  04-16-14: ct of head: 1. No acute intracranial findings. 2. Severe volume loss and chronic microvascular ischemic changes 3. Left maxillary sinus disease, likely chronic.  04-16-14: chest x-ray: No active cardiopulmonary disease.  04-17-14: doppler  bilateral lower extremities: - No evidence of deep vein thrombosis involving the visualized veins of the right lower extremity. - No evidence of deep vein thrombosis involving the visualized veins of the left lower extremity. - No evidence of Baker&'s cyst on the right or left.    LABS REVIEWED:   08-11-13: tsh 1.750 12-04-13: glucose 86; bun 22; creat 0.9; k+4.0; na++146  02-21-14: glucose 85; bun 20; creat 0.9; k+4.0; na++ 143; tsh 1.78 04-16-14: wbc 4.0; hgb 11.3; hct 34.6; mcv 101.5; plt 105;  glucose 175; bun 28; creat 1.24; k+3.6; na++143; liver normal albumin 2.7 04-20-14: wbc 2.8; hgb 10.7; hct 32.3; mcv 991.; lt 92; glucose 86; bun 9; creat 0.79; k+4.0; na++141; liver normal albumin 2.7     ROS Unable to perform ROS   Physical Exam Constitutional: She appears well-developed and well-nourished. No distress.  Neck: Neck supple. No JVD present. No thyromegaly present.  Cardiovascular: Normal rate, regular rhythm and intact distal pulses.   Respiratory: Effort normal and breath sounds normal. No respiratory distress.  GI: Soft. Bowel sounds are normal. She exhibits no distension.  Musculoskeletal: She exhibits edema.  Is able to move all  extremities Has 3+ lower extremity edema L>R   Neurological: She is alert.  Skin: Skin is warm and dry. She is not diaphoretic.   left lower extremity red and inflamed     ASSESSMENT/ PLAN:  1. Cellulitis left lower extremity: will begin doxycycline 100 mg twice daily for 2 weeks with florastor twice daily and will monitor  Will get a doppler of left lower extremity   2. Edema: is not improving; will increase lasix to 40 mg daily and will check bmp in one week will have her wear ted hose; will monitor    Melanie Innocenteborah Edwena Mayorga NP [facility]  Contact [phone] Monday through Friday 8am- 5pm  After hours call [phone]

## 2014-08-14 ENCOUNTER — Encounter: Payer: Self-pay | Admitting: Internal Medicine

## 2014-08-14 ENCOUNTER — Non-Acute Institutional Stay (SKILLED_NURSING_FACILITY): Payer: Medicare Other | Admitting: Internal Medicine

## 2014-08-14 DIAGNOSIS — I10 Essential (primary) hypertension: Secondary | ICD-10-CM | POA: Diagnosis not present

## 2014-08-14 DIAGNOSIS — F0151 Vascular dementia with behavioral disturbance: Secondary | ICD-10-CM

## 2014-08-14 DIAGNOSIS — F209 Schizophrenia, unspecified: Secondary | ICD-10-CM

## 2014-08-14 DIAGNOSIS — E039 Hypothyroidism, unspecified: Secondary | ICD-10-CM

## 2014-08-14 DIAGNOSIS — I5032 Chronic diastolic (congestive) heart failure: Secondary | ICD-10-CM | POA: Diagnosis not present

## 2014-08-14 DIAGNOSIS — D519 Vitamin B12 deficiency anemia, unspecified: Secondary | ICD-10-CM | POA: Diagnosis not present

## 2014-08-14 DIAGNOSIS — F01518 Vascular dementia, unspecified severity, with other behavioral disturbance: Secondary | ICD-10-CM

## 2014-08-14 NOTE — Progress Notes (Signed)
Patient ID: Melanie Roberson, female   DOB: 1923-09-10, 81 y.o.   MRN: [id number]     DATE: 08/14/14  Location:  [facility]    Place of Service: SNF [phone])   Extended Emergency Contact Information Primary Emergency Contact: Lars Pinks [place] Home Phone: [phone] Relation: None Secondary Emergency Contact: [facility] Address: [address]          [place] Home Phone: [phone] Relation: None  Advanced Directive information  FULL CODE  Chief Complaint  Patient presents with  . Medical Management of Chronic Issues    HPI:  81 yo female long term resident seen today for f/u. She has no concerns. No nursing issues. No falls. She is a poor historian due to mental status. Hx obtained from chart.  Schizophrenia/dementia/insomnia/depression - mood stable on cymbalta and lorazepam prn  Thyroid - stable on levothyroxine  HTN/edema/hx CHF - stable BP on lasix with potassium supplement  Constipation - stable on lactulose and miralalx  She receives nutritional supplements TID due to poor po intake. She also takes vitamins/minerals. She has duonebs prn SOB  Past Medical History  Diagnosis Date  . MYALGIA 07/13/2007    Qualifier: Diagnosis of  By: Gavin Potters MD, HEIDI    . SCHIZOPHRENIA 04/29/2006    Qualifier: Diagnosis of  By: Erenest Rasher MD, MADELEINE    . TREMOR, ESSENTIAL 05/28/2006    Qualifier: Diagnosis of  By: Erenest Rasher MD, MADELEINE    . Insomnia 08/11/2013  . Anemia   . Thyroid disease   . Hyperlipidemia   . Hypertension   . Depression   . CHF (congestive heart failure)     No past surgical history on file.  Patient Care Team: Kirt Boys, DO as PCP - General (Internal Medicine)  History   Social History  . Marital Status: Widowed    Spouse Name: N/A  . Number of Children: N/A  . Years of Education: N/A   Occupational History  . Not on file.   Social History Main Topics  . Smoking status:  Former Games developer  . Smokeless tobacco: Current User    Types: Snuff  . Alcohol Use: No  . Drug Use: No  . Sexual Activity: No   Other Topics Concern  . Not on file   Social History Narrative     reports that she has quit smoking. Her smokeless tobacco use includes Snuff. She reports that she does not drink alcohol or use illicit drugs.  Immunization History  Administered Date(s) Administered  . Influenza Whole 01/18/2007, 11/17/2007, 03/03/2009  . Influenza-Unspecified 12/14/2013  . PPD Test 12/23/2009, 10/09/2010  . Pneumococcal Conjugate-13 03/03/2009, 12/19/2013  . Pneumococcal Polysaccharide-23 03/02/2006  . Td 11/17/2007    Allergies  Allergen Reactions  . Erythromycin     Per MAR  . Penicillins     Per MAR  . Sulfa Antibiotics     Per MAR    Medications: Patient's Medications  New Prescriptions   No medications on file  Previous Medications   CYANOCOBALAMIN 500 MCG TABLET    Take 500 mcg by mouth daily. For B-12 deficiency   DOXYCYCLINE (VIBRAMYCIN) 50 MG CAPSULE    Take 2 capsules (100 mg total) by mouth 2 (two) times daily.   DULOXETINE (CYMBALTA) 30 MG CAPSULE    Take 30 mg by mouth daily. For anxiety   FUROSEMIDE (LASIX) 40 MG TABLET    Take 0.5 tablets (20 mg total) by mouth daily.  GUAIFENESIN (ROBITUSSIN) 100 MG/5ML SOLN    Take 10 mLs by mouth every 6 (six) hours as needed (cough).    IPRATROPIUM-ALBUTEROL (DUONEB) 0.5-2.5 (3) MG/3ML SOLN    Take 3 mLs by nebulization 3 (three) times daily. And daily as needed   LACTULOSE (CHRONULAC) 10 GM/15ML SOLUTION    Take 30 g by mouth every morning.    LEVOTHYROXINE (SYNTHROID, LEVOTHROID) 100 MCG TABLET    Take 100 mcg by mouth daily. For hypothyroidism   MULTIPLE VITAMINS-MINERALS (MULTIVITAMIN WITH MINERALS) TABLET    Take 1 tablet by mouth daily.   POLYETHYLENE GLYCOL POWDER (MIRALAX) POWDER    Take 15 g by mouth daily. For constipation. Dispense 1 month supply.   POLYVINYL ALCOHOL-POVIDONE (HYPOTEARS) 1.4-0.6  % OPHTHALMIC SOLUTION    Place 1 drop into both eyes 3 (three) times daily. For dry eyes   POTASSIUM CHLORIDE SA (K-DUR,KLOR-CON) 20 MEQ TABLET    Take 20 mEq by mouth daily. For heart disease   QUETIAPINE (SEROQUEL) 25 MG TABLET    Take 25 mg by mouth at bedtime.  Modified Medications   No medications on file  Discontinued Medications   No medications on file    Review of Systems  Unable to perform ROS: Psychiatric disorder    Filed Vitals:   08/14/14 1652  BP: 130/70  Pulse: 70  Temp: 97.2 F (36.2 C)  Weight: 137 lb (62.143 kg)  SpO2: 87%   Body mass index is 22.12 kg/(m^2).  Physical Exam  Constitutional: She appears well-developed.  Sitting in w/c in NAD. Frail appearing  HENT:  Mouth/Throat: Oropharynx is clear and moist. No oropharyngeal exudate.  Eyes: Pupils are equal, round, and reactive to light. No scleral icterus.  Neck: Neck supple. Carotid bruit is not present. No tracheal deviation present.  No gross thyromegaly  Cardiovascular: Normal rate and intact distal pulses.  An irregularly irregular rhythm present. Exam reveals no gallop and no friction rub.   Murmur heard.  Systolic murmur is present with a grade of 1/6  TED stockings intact b/l. No LE edema b/l. No calf TTP  Pulmonary/Chest: Effort normal and breath sounds normal. No stridor. No respiratory distress. She has no wheezes. She has no rales.  Abdominal: Soft. Bowel sounds are normal. She exhibits no distension and no mass. There is no hepatomegaly. There is no tenderness. There is no rebound and no guarding.  Musculoskeletal: She exhibits edema and tenderness.  Lymphadenopathy:    She has no cervical adenopathy.  Neurological: She is alert.  Skin: Skin is warm and dry. No rash noted.  Psychiatric: She has a normal mood and affect. Her behavior is normal.     Labs reviewed: No visits with results within 3 Month(s) from this visit. Latest known visit with results is:  Admission on 04/16/2014,  Discharged on 04/20/2014  No results displayed because visit has over 200 results.    albumin 3.1  No results found.   Assessment/Plan   ICD-9-CM ICD-10-CM   1. Vascular dementia with behavior disturbance - stable 290.40 F01.51   2. Schizophrenia, unspecified type - stable 295.90 F20.9   3. Hypothyroidism, unspecified hypothyroidism type - stable 244.9 E03.9   4. HTN (hypertension), benign - stable 401.1 I10   5. Chronic diastolic CHF (congestive heart failure) - stable 428.32 I50.32    428.0    6. B12 deficiency anemia - stable 281.1 D51.9     --cont nutritional supplement TID as ordered  --Pt is medically stable on current tx  plan. Continue current medications as ordered. PT/OT/ST as indicated. Will follow  Shatori Bertucci S. Ancil Linsey  [facility] [address] [place], [place] [address] [phone] Cell (Monday-Friday 8 AM - 5 PM) [phone] After 5 PM and follow prompts

## 2014-08-29 NOTE — Progress Notes (Signed)
Patient ID: Michelle PiperHelen M Campoverde, female   DOB: 1923-05-23, [age] y.o.   MRN: [id number]  [name]     Allergies  Allergen Reactions  . Erythromycin     Per MAR  . Penicillins     Per MAR  . [facility] Antibiotics     [facility]       Chief Complaint  Patient presents with  . Medical Management of Chronic Issues    HPI:  She is a long term resident of this facility being seen for the management of her chronic illnesses. Her left lower leg remains inflamed and red present. She has completed doxycycline on 06-12-14. She is unable to fully participate in the hpi or ros.    Past Medical History  Diagnosis Date  . MYALGIA 07/13/2007    Qualifier: Diagnosis of  By: Gavin PottersGRANDIS MD, HEIDI    . SCHIZOPHRENIA 04/29/2006    Qualifier: Diagnosis of  By: Erenest RasherVANSTORY MD, MADELEINE    . TREMOR, ESSENTIAL 05/28/2006    Qualifier: Diagnosis of  By: Erenest RasherVANSTORY MD, MADELEINE    . Insomnia 08/11/2013  . Anemia   . Thyroid disease   . Hyperlipidemia   . Hypertension   . Depression   . CHF (congestive heart failure)     No past surgical history on file.  VITAL SIGNS BP 130/70 mmHg  Pulse 76  Ht 5\' 6"  (1.676 m)  Wt 139 lb (63.05 kg)  BMI 22.45 kg/m2   Outpatient Encounter Prescriptions as of 07/02/2014  Medication Sig  . cyanocobalamin 500 MCG tablet Take 500 mcg by mouth daily. For B-12 deficiency  . DULoxetine (CYMBALTA) 30 MG capsule Take 30 mg by mouth daily. For anxiety  . furosemide (LASIX) 40 MG tablet Take 0.5 tablets (20 mg total) by mouth daily.  Marland Kitchen. guaiFENesin (ROBITUSSIN) 100 MG/5ML SOLN Take 10 mLs by mouth every 6 (six) hours as needed (cough).   Marland Kitchen. ipratropium-albuterol (DUONEB) 0.5-2.5 (3) MG/3ML SOLN Take 3 mLs by nebulization 3 (three) times daily. And daily as needed  . lactulose (CHRONULAC) 10 GM/15ML solution Take 30 g by mouth every morning.   Marland Kitchen. levothyroxine (SYNTHROID, LEVOTHROID) 100 MCG tablet Take 100 mcg by mouth daily. For hypothyroidism  . Multiple  Vitamins-Minerals (MULTIVITAMIN WITH MINERALS) tablet Take 1 tablet by mouth daily.  . polyethylene glycol powder (MIRALAX) powder Take 15 g by mouth daily. For constipation. Dispense 1 month supply.  . polyvinyl alcohol-povidone (HYPOTEARS) 1.4-0.6 % ophthalmic solution Place 1 drop into both eyes 3 (three) times daily. For dry eyes  . potassium chloride SA (K-DUR,KLOR-CON) 20 MEQ tablet Take 20 mEq by mouth daily. For heart disease      SIGNIFICANT DIAGNOSTIC EXAMS  04-16-14: ct of head: 1. No acute intracranial findings. 2. Severe volume loss and chronic microvascular ischemic changes 3. Left maxillary sinus disease, likely chronic.  04-16-14: chest x-ray: No active cardiopulmonary disease.  04-17-14: doppler  bilateral lower extremities: - No evidence of deep vein thrombosis involving the visualized veins of the right lower extremity. - No evidence of deep vein thrombosis involving the visualized veins of the left lower extremity. - No evidence of Baker&'s cyst on the right or left.  05-30-14: left lower extremity doppler: no dvt    LABS REVIEWED:   08-11-13: tsh 1.750 12-04-13: glucose 86; bun 22; creat 0.9; k+4.0; na++146  02-21-14: glucose 85; bun 20; creat 0.9; k+4.0; na++ 143; tsh 1.78 04-16-14: wbc 4.0; hgb 11.3; hct 34.6; mcv 101.5; plt 105; glucose 175; bun  28; creat 1.24; k+3.6; na++143; liver normal albumin 2.7 04-20-14: wbc 2.8; hgb 10.7; hct 32.3; mcv 991.; lt 92; glucose 86; bun 9; creat 0.79; k+4.0; na++141; liver normal albumin 2.7  06-04-14: glucose 82; bun 34; creat 1.14; k+3.7; na++144       Review of Systems  Unable to perform ROS    Physical Exam Constitutional: She appears well-developed and well-nourished. No distress.  Neck: Neck supple. No JVD present. No thyromegaly present.  Cardiovascular: Normal rate, regular rhythm and intact distal pulses.   Respiratory: Effort normal and breath sounds normal. No respiratory distress.  GI: Soft. Bowel sounds are  normal. She exhibits no distension.  Musculoskeletal: She exhibits edema.  Is able to move all extremities Has 3+ lower extremity edema L>R   Neurological: She is alert.  Skin: Skin is warm and dry. She is not diaphoretic.   left lower extremity red and inflamed;    ASSESSMENT/ PLAN:  1. Cellulitis: will begin cipro 500 mg twice daily for 10 days with florastor twice daily   2. Hypothyroidism: is stable will continue synthroid 100 mcg daily and will monitor tsh is 1.78  3. CHF: is presently stable will continue her lasix 20 mg daily with k+ 20 meq daily and will monitor   4. Constipation: will continue miralax daily and lactulose 30 cc daily   5. Schizophrenia: is emotionally stable at this time will continue to  monitor   6. Depression: will continue her cymbalta 30 mg daily and will monitor her status.       Synthia Innocent NP [facility]  Contact [phone] Monday through Friday 8am- 5pm  After hours call [phone]

## 2014-09-03 NOTE — Progress Notes (Signed)
Patient ID: Melanie Roberson, female   DOB: Jan 06, 1924, 81 y.o.   MRN: [id number]  [facility] living [place]     Allergies  Allergen Reactions  . Erythromycin     Per MAR  . Penicillins     Per MAR  . Sulfa Antibiotics     Per [facility]        Chief Complaint  Patient presents with  . Acute Visit    edema     HPI:  Staff reports that she has lower extremity edema which is not getting better. She has been treated for lower extremity cellulitis. Her left leg is less red and inflamed. She is unable to fully participate in the hpi or ros.    Past Medical History  Diagnosis Date  . MYALGIA 07/13/2007    Qualifier: Diagnosis of  By: Gavin Potters MD, HEIDI    . SCHIZOPHRENIA 04/29/2006    Qualifier: Diagnosis of  By: Erenest Rasher MD, MADELEINE    . TREMOR, ESSENTIAL 05/28/2006    Qualifier: Diagnosis of  By: Erenest Rasher MD, MADELEINE    . Insomnia 08/11/2013  . Anemia   . Thyroid disease   . Hyperlipidemia   . Hypertension   . Depression   . CHF (congestive heart failure)     No past surgical history on file.  VITAL SIGNS BP 120/62 mmHg  Pulse 89  Ht  (1.676 m)  Wt 140 lb (63.504 kg)  BMI 22.61 kg/m2   Outpatient Encounter Prescriptions as of 07/13/2014  Medication Sig  . cyanocobalamin 500 MCG tablet Take 500 mcg by mouth daily. For B-12 deficiency  . DULoxetine (CYMBALTA) 30 MG capsule Take 30 mg by mouth daily. For anxiety  . furosemide (LASIX) 40 MG tablet Take 0.5 tablets (20 mg total) by mouth daily.  Marland Kitchen guaiFENesin (ROBITUSSIN) 100 MG/5ML SOLN Take 10 mLs by mouth every 6 (six) hours as needed (cough).   Marland Kitchen ipratropium-albuterol (DUONEB) 0.5-2.5 (3) MG/3ML SOLN Take 3 mLs by nebulization 3 (three) times daily. And daily as needed  . lactulose (CHRONULAC) 10 GM/15ML solution Take 30 g by mouth every morning.   Marland Kitchen levothyroxine (SYNTHROID, LEVOTHROID) 100 MCG tablet Take 100 mcg by mouth daily. For hypothyroidism  . Multiple Vitamins-Minerals (MULTIVITAMIN WITH MINERALS) tablet  Take 1 tablet by mouth daily.  . polyethylene glycol powder (MIRALAX) powder Take 17 g by mouth daily. For constipation.  .  . polyvinyl alcohol-povidone (HYPOTEARS) 1.4-0.6 % ophthalmic solution Place 1 drop into both eyes 3 (three) times daily. For dry eyes  . potassium chloride SA (K-DUR,KLOR-CON) 20 MEQ tablet Take 20 mEq by mouth daily. For heart disease  . QUEtiapine (SEROQUEL) 25 MG tablet Take 25 mg by mouth at bedtime.      SIGNIFICANT DIAGNOSTIC EXAMS  04-16-14: ct of head: 1. No acute intracranial findings. 2. Severe volume loss and chronic microvascular ischemic changes 3. Left maxillary sinus disease, likely chronic.  04-16-14: chest x-ray: No active cardiopulmonary disease.  04-17-14: doppler  bilateral lower extremities: - No evidence of deep vein thrombosis involving the visualized veins of the right lower extremity. - No evidence of deep vein thrombosis involving the visualized veins of the left lower extremity. - No evidence of Baker&'s cyst on the right or left.  05-30-14: left lower extremity doppler: no dvt    LABS REVIEWED:   08-11-13: tsh 1.750 12-04-13: glucose 86; bun 22; creat 0.9; k+4.0; na++146  02-21-14: glucose 85; bun 20; creat 0.9; k+4.0; na++ 143; tsh 1.78 04-16-14: wbc 4.0; hgb  11.3; hct 34.6; mcv 101.5; plt 105; glucose 175; bun 28; creat 1.24; k+3.6; na++143; liver normal albumin 2.7 04-20-14: wbc 2.8; hgb 10.7; hct 32.3; mcv 991.; lt 92; glucose 86; bun 9; creat 0.79; k+4.0; na++141; liver normal albumin 2.7  06-04-14: glucose 82; bun 34; creat 1.14; k+3.7; na++144 07-04-14: wbc 5.5; hgb 12.1; hct 36.3; mcv 96.5; plt 150; glucose 101; bun 18; creat 0.94; k+ 3.8; na++138; liver normal albumin 3.1      Review of Systems  Unable to perform ROS    Physical Exam Constitutional: She appears well-developed and well-nourished. No distress.  Neck: Neck supple. No JVD present. No thyromegaly present.  Cardiovascular: Normal rate, regular rhythm and intact  distal pulses.   Respiratory: Effort normal and breath sounds normal. No respiratory distress.  GI: Soft. Bowel sounds are normal. She exhibits no distension.  Musculoskeletal: She exhibits edema.  Is able to move all extremities Has 3+ lower extremity edema Neurological: She is alert.  Skin: Skin is warm and dry. She is not diaphoretic.   left lower extremity slightly red is improving;    ASSESSMENT/ PLAN:   1. CHF: she has worsening leg edema; will increase her lasix to 40 mg twice daily with k+ 20 meq twice daily and will monitor her status.    Synthia Innocenteborah Lorae Roig NP [facility]  Contact [phone] Monday through Friday 8am- 5pm  After hours call [phone]

## 2014-09-14 ENCOUNTER — Non-Acute Institutional Stay (SKILLED_NURSING_FACILITY): Payer: Medicare Other | Admitting: Adult Health

## 2014-09-14 DIAGNOSIS — R609 Edema, unspecified: Secondary | ICD-10-CM | POA: Diagnosis not present

## 2014-09-14 DIAGNOSIS — F0280 Dementia in other diseases classified elsewhere without behavioral disturbance: Secondary | ICD-10-CM | POA: Diagnosis not present

## 2014-09-14 DIAGNOSIS — E039 Hypothyroidism, unspecified: Secondary | ICD-10-CM | POA: Diagnosis not present

## 2014-09-14 DIAGNOSIS — I10 Essential (primary) hypertension: Secondary | ICD-10-CM

## 2014-09-14 DIAGNOSIS — K5900 Constipation, unspecified: Secondary | ICD-10-CM

## 2014-09-14 DIAGNOSIS — F01518 Vascular dementia, unspecified severity, with other behavioral disturbance: Secondary | ICD-10-CM

## 2014-09-14 DIAGNOSIS — F329 Major depressive disorder, single episode, unspecified: Secondary | ICD-10-CM

## 2014-09-14 DIAGNOSIS — F0151 Vascular dementia with behavioral disturbance: Secondary | ICD-10-CM | POA: Diagnosis not present

## 2014-09-14 DIAGNOSIS — F0393 Unspecified dementia, unspecified severity, with mood disturbance: Secondary | ICD-10-CM

## 2014-11-09 ENCOUNTER — Non-Acute Institutional Stay (SKILLED_NURSING_FACILITY): Payer: Medicare Other | Admitting: Adult Health

## 2014-11-09 DIAGNOSIS — R609 Edema, unspecified: Secondary | ICD-10-CM | POA: Diagnosis not present

## 2014-11-09 DIAGNOSIS — F0280 Dementia in other diseases classified elsewhere without behavioral disturbance: Secondary | ICD-10-CM | POA: Diagnosis not present

## 2014-11-09 DIAGNOSIS — F0393 Unspecified dementia, unspecified severity, with mood disturbance: Secondary | ICD-10-CM

## 2014-11-09 DIAGNOSIS — I10 Essential (primary) hypertension: Secondary | ICD-10-CM | POA: Diagnosis not present

## 2014-11-09 DIAGNOSIS — F329 Major depressive disorder, single episode, unspecified: Secondary | ICD-10-CM | POA: Diagnosis not present

## 2014-11-09 DIAGNOSIS — F01518 Vascular dementia, unspecified severity, with other behavioral disturbance: Secondary | ICD-10-CM

## 2014-11-09 DIAGNOSIS — F0151 Vascular dementia with behavioral disturbance: Secondary | ICD-10-CM

## 2014-11-09 DIAGNOSIS — E039 Hypothyroidism, unspecified: Secondary | ICD-10-CM | POA: Diagnosis not present

## 2014-12-10 ENCOUNTER — Non-Acute Institutional Stay (SKILLED_NURSING_FACILITY): Payer: Medicare Other | Admitting: Adult Health

## 2014-12-10 DIAGNOSIS — I10 Essential (primary) hypertension: Secondary | ICD-10-CM | POA: Diagnosis not present

## 2014-12-10 DIAGNOSIS — K5901 Slow transit constipation: Secondary | ICD-10-CM | POA: Diagnosis not present

## 2014-12-10 DIAGNOSIS — E034 Atrophy of thyroid (acquired): Secondary | ICD-10-CM | POA: Diagnosis not present

## 2014-12-10 DIAGNOSIS — F0280 Dementia in other diseases classified elsewhere without behavioral disturbance: Secondary | ICD-10-CM | POA: Diagnosis not present

## 2014-12-10 DIAGNOSIS — E876 Hypokalemia: Secondary | ICD-10-CM | POA: Diagnosis not present

## 2014-12-10 DIAGNOSIS — E038 Other specified hypothyroidism: Secondary | ICD-10-CM | POA: Diagnosis not present

## 2014-12-10 DIAGNOSIS — R600 Localized edema: Secondary | ICD-10-CM | POA: Diagnosis not present

## 2014-12-10 DIAGNOSIS — F329 Major depressive disorder, single episode, unspecified: Secondary | ICD-10-CM | POA: Diagnosis not present

## 2014-12-10 DIAGNOSIS — F01518 Vascular dementia, unspecified severity, with other behavioral disturbance: Secondary | ICD-10-CM

## 2014-12-10 DIAGNOSIS — F0393 Unspecified dementia, unspecified severity, with mood disturbance: Secondary | ICD-10-CM

## 2014-12-10 DIAGNOSIS — F0151 Vascular dementia with behavioral disturbance: Secondary | ICD-10-CM

## 2014-12-17 ENCOUNTER — Encounter: Payer: Self-pay | Admitting: Adult Health

## 2014-12-17 NOTE — Progress Notes (Signed)
Patient ID: Melanie Roberson, female   DOB: 08/19/1923, 81 y.o.   MRN: [id number]    [facility]      Allergies  Allergen Reactions  . Erythromycin     Per MAR  . Penicillins     Per MAR  . Sulfa Antibiotics     [facility]    Chief Complaint  Patient presents with  . Medical Management of Chronic Issues    HPI:  She is a long term resident of this facility being seen for the management of her chronic illnesses. Overall there is little change in her status. She does use snuff on a daily basis; however; she tends to eat the snuff rather than placing it in her gum line. She is unable to fully participate in the hpi or ros. There are no nursing concerns at this time.    Past Medical History  Diagnosis Date  . MYALGIA 07/13/2007    Qualifier: Diagnosis of  By: Gavin PottersGRANDIS MD, HEIDI    . SCHIZOPHRENIA 04/29/2006    Qualifier: Diagnosis of  By: Erenest RasherVANSTORY MD, MADELEINE    . TREMOR, ESSENTIAL 05/28/2006    Qualifier: Diagnosis of  By: Erenest RasherVANSTORY MD, MADELEINE    . Insomnia 08/11/2013  . Anemia   . Thyroid disease   . Hyperlipidemia   . Hypertension   . Depression   . CHF (congestive heart failure) (HCC)     No past surgical history on file.  VITAL SIGNS BP 143/71 mmHg  Pulse 71  Ht 5\' 6"  (1.676 m)  Wt 138 lb (62.596 kg)  BMI 22.28 kg/m2  Patient's Medications  New Prescriptions   No medications on file  Previous Medications   CYANOCOBALAMIN 500 MCG TABLET    Take 500 mcg by mouth daily. For B-12 deficiency   DULOXETINE (CYMBALTA) 30 MG CAPSULE    Take 30 mg by mouth daily. For anxiety   FUROSEMIDE (LASIX) 40 MG TABLET    Take 0.5 tablets (20 mg total) by mouth daily.   GUAIFENESIN (ROBITUSSIN) 100 MG/5ML SOLN    Take 10 mLs by mouth every 6 (six) hours as needed (cough).    IPRATROPIUM-ALBUTEROL (DUONEB) 0.5-2.5 (3) MG/3ML SOLN    Take 3 mLs by nebulization 3 (three) times daily as needed (wheezing).    LACTULOSE (CHRONULAC) 10 GM/15ML SOLUTION    Take 30  g by mouth every morning.    LEVOTHYROXINE (SYNTHROID, LEVOTHROID) 100 MCG TABLET    Take 100 mcg by mouth daily. For hypothyroidism   LORAZEPAM (ATIVAN) 0.5 MG TABLET    Take 0.5 mg by mouth 2 (two) times daily as needed for anxiety.   MULTIPLE VITAMINS-MINERALS (MULTIVITAMIN WITH MINERALS) TABLET    Take 1 tablet by mouth daily.   POLYETHYLENE GLYCOL POWDER (MIRALAX) POWDER    Take 17 g by mouth daily. For constipation. Dispense 1 month supply.   POLYVINYL ALCOHOL-POVIDONE (HYPOTEARS) 1.4-0.6 % OPHTHALMIC SOLUTION    Place 1 drop into both eyes 3 (three) times daily. For dry eyes   POTASSIUM CHLORIDE SA (K-DUR,KLOR-CON) 20 MEQ TABLET    Take 20 mEq by mouth 2 (two) times daily. For heart disease  Modified Medications   No medications on file  Discontinued Medications     SIGNIFICANT DIAGNOSTIC EXAMS   04-16-14: ct of head: 1. No acute intracranial findings. 2. Severe volume loss and chronic microvascular ischemic changes 3. Left maxillary sinus disease, likely chronic.  04-16-14: chest x-ray: No active cardiopulmonary disease.  04-17-14: doppler  bilateral lower extremities: - No evidence of deep vein thrombosis involving the visualized veins of the right lower extremity. - No evidence of deep vein thrombosis involving the visualized veins of the left lower extremity. - No evidence of Baker&'s cyst on the right or left.  05-30-14: left lower extremity doppler: no dvt    LABS REVIEWED:   12-04-13: glucose 86; bun 22; creat 0.9; k+4.0; na++146  02-21-14: glucose 85; bun 20; creat 0.9; k+4.0; na++ 143; tsh 1.78 04-16-14: wbc 4.0; hgb 11.3; hct 34.6; mcv 101.5; plt 105; glucose 175; bun 28; creat 1.24; k+3.6; na++143; liver normal albumin 2.7 04-20-14: wbc 2.8; hgb 10.7; hct 32.3; mcv 99.1; plt 92; glucose 86; bun 9; creat 0.79; k+4.0; na++141; liver normal albumin 2.7  06-04-14: glucose 82; bun 34; creat 1.14; k+3.7; na++144 07-04-14: wbc 5.5; hgb 12.1; hct 36.3; mcv 96.5; plt 150; glucose  101; bun 18; creat 0.94; k+ 3.8; na++138; liver normal albumin 3.1   08-23-14: urine culture: proteus mirabilis: rocephin      Review of Systems  Unable to perform ROS: Dementia      Physical Exam  Constitutional: No distress.  Eyes: Conjunctivae are normal.  Neck: Neck supple. No JVD present. No thyromegaly present.  Cardiovascular: Normal rate, regular rhythm and intact distal pulses.   Respiratory: Effort normal and breath sounds normal. No respiratory distress. She has no wheezes.  GI: Soft. Bowel sounds are normal. She exhibits no distension. There is no tenderness.  Musculoskeletal: She exhibits edema.  Able to move all extremities  2+ lower extremity edema   Lymphadenopathy:    She has no cervical adenopathy.  Neurological: She is alert.  Skin: Skin is warm and dry. She is not diaphoretic.  Psychiatric: She has a normal mood and affect.       ASSESSMENT/ PLAN:  1. Edema: is stable will continue lasix 40 mg twice daily with k+ 20 meq twice daily and will monitor her status   2. Hypothyroidism: will continue synthroid 100 mcg daily tsh is 1.78  3.  Hypertension: is presently not on medications; will not make changes will  Monitor  4. Constipation: will continue lactulose 30 cc daily and miralax daily   5. Vascular dementia: no significant change in status; her current weight is 138 pounds; is presently not on medications; will not make changes will monitor  6. Depression: does receive benefit from 30 mg daily and has ativan 0.5 mg twice daily as needed for anxiety       Synthia Innocent NP [facility]  Contact [phone] Monday through Friday 8am- 5pm  After hours call [phone]

## 2015-01-03 ENCOUNTER — Encounter: Payer: Self-pay | Admitting: Adult Health

## 2015-01-03 NOTE — Progress Notes (Signed)
Patient ID: Melanie Roberson, female   DOB: 14-Oct-1923, 81 y.o.   MRN: [id number]   Facility: [facility]      Allergies  Allergen Reactions  . Erythromycin     Per MAR  . Penicillins     Per MAR  . Sulfa Antibiotics     Per [facility]    Chief Complaint  Patient presents with  . Medical Management of Chronic Issues    HPI:  She is a long term resident of this facility being seen for the management of her chronic illnesses. There is little change in her status. She is unable to fully participate in the hpi or ros. She continues to use snuff on a daily basis; but she tends to eat snuff rather than use it in her gum line; and at times will eat snuff rather eat food.    Past Medical History  Diagnosis Date  . MYALGIA 07/13/2007    Qualifier: Diagnosis of  By: Gavin Potters MD, HEIDI    . SCHIZOPHRENIA 04/29/2006    Qualifier: Diagnosis of  By: Erenest Rasher MD, MADELEINE    . TREMOR, ESSENTIAL 05/28/2006    Qualifier: Diagnosis of  By: Erenest Rasher MD, MADELEINE    . Insomnia 08/11/2013  . Anemia   . Thyroid disease   . Hyperlipidemia   . Hypertension   . Depression   . CHF (congestive heart failure) (HCC)     No past surgical history on file.  VITAL SIGNS BP 126/70 mmHg  Pulse 77  Ht  (1.676 m)  Wt 135 lb (61.236 kg)  BMI 21.80 kg/m2  Patient's Medications  New Prescriptions   No medications on file  Previous Medications   CYANOCOBALAMIN 500 MCG TABLET    Take 500 mcg by mouth daily. For B-12 deficiency   DULOXETINE (CYMBALTA) 30 MG CAPSULE    Take 30 mg by mouth daily. For anxiety   FUROSEMIDE (LASIX) 40 MG TABLET    Take 0.5 tablets (20 mg total) by mouth daily.   IPRATROPIUM-ALBUTEROL (DUONEB) 0.5-2.5 (3) MG/3ML SOLN    Take 3 mLs by nebulization 3 (three) times daily as needed (wheezing).    LACTULOSE (CHRONULAC) 10 GM/15ML SOLUTION    Take 30 g by mouth every morning.    LEVOTHYROXINE (SYNTHROID, LEVOTHROID) 100 MCG TABLET    Take 100 mcg by mouth daily. For  hypothyroidism   LORAZEPAM (ATIVAN) 0.5 MG TABLET    Take 0.5 mg by mouth 2 (two) times daily as needed for anxiety.   MULTIPLE VITAMINS-MINERALS (MULTIVITAMIN WITH MINERALS) TABLET    Take 1 tablet by mouth daily.   POLYETHYLENE GLYCOL POWDER (MIRALAX) POWDER    Take 17 g by mouth daily. For constipation. Dispense 1 month supply.   POLYVINYL ALCOHOL-POVIDONE (HYPOTEARS) 1.4-0.6 % OPHTHALMIC SOLUTION    Place 1 drop into both eyes 3 (three) times daily. For dry eyes   POTASSIUM CHLORIDE SA (K-DUR,KLOR-CON) 20 MEQ TABLET    Take 20 mEq by mouth 2 (two) times daily. For heart disease  Modified Medications   No medications on file  Discontinued Medications     SIGNIFICANT DIAGNOSTIC EXAMS   04-16-14: ct of head: 1. No acute intracranial findings. 2. Severe volume loss and chronic microvascular ischemic changes 3. Left maxillary sinus disease, likely chronic.  04-16-14: chest x-ray: No active cardiopulmonary disease.  04-17-14: doppler  bilateral lower extremities: - No evidence of deep vein thrombosis involving the visualized veins of the right lower extremity. - No evidence of  deep vein thrombosis involving the visualized veins of the left lower extremity. - No evidence of Baker&'s cyst on the right or left.  05-30-14: left lower extremity doppler: no dvt    LABS REVIEWED:   12-04-13: glucose 86; bun 22; creat 0.9; k+4.0; na++146  02-21-14: glucose 85; bun 20; creat 0.9; k+4.0; na++ 143; tsh 1.78 04-16-14: wbc 4.0; hgb 11.3; hct 34.6; mcv 101.5; plt 105; glucose 175; bun 28; creat 1.24; k+3.6; na++143; liver normal albumin 2.7 04-20-14: wbc 2.8; hgb 10.7; hct 32.3; mcv 99.1; plt 92; glucose 86; bun 9; creat 0.79; k+4.0; na++141; liver normal albumin 2.7  06-04-14: glucose 82; bun 34; creat 1.14; k+3.7; na++144 07-04-14: wbc 5.5; hgb 12.1; hct 36.3; mcv 96.5; plt 150; glucose 101; bun 18; creat 0.94; k+ 3.8; na++138; liver normal albumin 3.1   08-23-14: urine culture: proteus mirabilis: rocephin       Review of Systems Unable to perform ROS: Dementia      Physical Exam Constitutional: No distress.  Eyes: Conjunctivae are normal.  Neck: Neck supple. No JVD present. No thyromegaly present.  Cardiovascular: Normal rate, regular rhythm and intact distal pulses.   Respiratory: Effort normal and breath sounds normal. No respiratory distress. She has no wheezes.  GI: Soft. Bowel sounds are normal. She exhibits no distension. There is no tenderness.  Musculoskeletal: She exhibits edema.  Able to move all extremities  2+ lower extremity edema   Lymphadenopathy:    She has no cervical adenopathy.  Neurological: She is alert.  Skin: Skin is warm and dry. She is not diaphoretic.  Psychiatric: She has a normal mood and affect.      ASSESSMENT/ PLAN:  1. Edema: is stable will continue lasix 40 mg daily with k+ 20 meq twice daily and will monitor her status   2. Hypothyroidism: will continue synthroid 100 mcg daily tsh is 1.78  3.  Hypertension: is presently not on medications; will not make changes will  Monitor  4. Constipation: will continue lactulose 30 cc daily and miralax daily   5. Vascular dementia: no significant change in status; her current weight is 138 pounds; is presently not on medications; will not make changes will monitor  6. Depression: does receive benefit from cymbalta 30 mg daily and has ativan 0.5 mg twice daily as needed for anxiety       Synthia Innocenteborah Aisley Whan NP [facility]  Contact [phone] Monday through Friday 8am- 5pm  After hours call [phone]

## 2015-01-11 ENCOUNTER — Non-Acute Institutional Stay (SKILLED_NURSING_FACILITY): Payer: Medicare Other | Admitting: Adult Health

## 2015-01-11 DIAGNOSIS — I10 Essential (primary) hypertension: Secondary | ICD-10-CM | POA: Diagnosis not present

## 2015-01-11 DIAGNOSIS — F0151 Vascular dementia with behavioral disturbance: Secondary | ICD-10-CM | POA: Diagnosis not present

## 2015-01-11 DIAGNOSIS — F01518 Vascular dementia, unspecified severity, with other behavioral disturbance: Secondary | ICD-10-CM

## 2015-01-11 DIAGNOSIS — E034 Atrophy of thyroid (acquired): Secondary | ICD-10-CM | POA: Diagnosis not present

## 2015-01-11 DIAGNOSIS — R600 Localized edema: Secondary | ICD-10-CM

## 2015-01-11 DIAGNOSIS — K5901 Slow transit constipation: Secondary | ICD-10-CM | POA: Diagnosis not present

## 2015-01-11 DIAGNOSIS — E038 Other specified hypothyroidism: Secondary | ICD-10-CM | POA: Diagnosis not present

## 2015-02-17 ENCOUNTER — Encounter: Payer: Self-pay | Admitting: Adult Health

## 2015-02-17 DIAGNOSIS — R600 Localized edema: Secondary | ICD-10-CM | POA: Insufficient documentation

## 2015-02-17 NOTE — Progress Notes (Signed)
Patient ID: Melanie Roberson, female   DOB: Sep 25, 1923, 81 y.o.   MRN: [id number]    Facility: [facility]      Allergies  Allergen Reactions  . Erythromycin     Per MAR  . Penicillins     Per MAR  . Sulfa Antibiotics     Per [facility]    Chief Complaint  Patient presents with  . Medical Management of Chronic Issues    HPI:  She is a long term resident of this facility being seen for the management of her chronic illnesses. Overall there is little change in her status. She is unable to participate in the hpi or ros. She does use snuff daily. There are no nursing concerns at this time. Her current weight is 137 pounds.    Past Medical History  Diagnosis Date  . MYALGIA 07/13/2007    Qualifier: Diagnosis of  By: Gavin Potters MD, HEIDI    . SCHIZOPHRENIA 04/29/2006    Qualifier: Diagnosis of  By: Erenest Rasher MD, MADELEINE    . TREMOR, ESSENTIAL 05/28/2006    Qualifier: Diagnosis of  By: Erenest Rasher MD, MADELEINE    . Insomnia 08/11/2013  . Anemia   . Thyroid disease   . Hyperlipidemia   . Hypertension   . Depression   . CHF (congestive heart failure) (HCC)     History reviewed. No pertinent past surgical history.  VITAL SIGNS BP 128/78 mmHg  Pulse 70  Ht  (1.676 m)  Wt 137 lb (62.143 kg)  BMI 22.12 kg/m2  Patient's Medications  New Prescriptions   No medications on file  Previous Medications   CYANOCOBALAMIN 500 MCG TABLET    Take 500 mcg by mouth daily. For B-12 deficiency   DULOXETINE (CYMBALTA) 30 MG CAPSULE    Take 30 mg by mouth daily. For anxiety   FUROSEMIDE (LASIX) 40 MG TABLET    Take 40 mg twice daily .   IPRATROPIUM-ALBUTEROL (DUONEB) 0.5-2.5 (3) MG/3ML SOLN    Take 3 mLs by nebulization 3 (three) times daily as needed (wheezing).    LACTULOSE (CHRONULAC) 10 GM/15ML SOLUTION    Take 30 g by mouth every morning.    LEVOTHYROXINE (SYNTHROID, LEVOTHROID) 100 MCG TABLET    Take 100 mcg by mouth daily. For hypothyroidism   LORAZEPAM (ATIVAN) 0.5 MG TABLET    Take 0.5 mg by  mouth 2 (two) times daily as needed for anxiety.   MULTIPLE VITAMINS-MINERALS (MULTIVITAMIN WITH MINERALS) TABLET    Take 1 tablet by mouth daily.   POLYETHYLENE GLYCOL POWDER (MIRALAX) POWDER    Take 17 g by mouth daily. For constipation. Dispense 1 month supply.   POLYVINYL ALCOHOL-POVIDONE (HYPOTEARS) 1.4-0.6 % OPHTHALMIC SOLUTION    Place 1 drop into both eyes 3 (three) times daily. For dry eyes   POTASSIUM CHLORIDE SA (K-DUR,KLOR-CON) 20 MEQ TABLET    Take 20 mEq by mouth 2 (two) times daily. For heart disease  Modified Medications   No medications on file  Discontinued Medications   No medications on file     SIGNIFICANT DIAGNOSTIC EXAMS   04-16-14: ct of head: 1. No acute intracranial findings. 2. Severe volume loss and chronic microvascular ischemic changes 3. Left maxillary sinus disease, likely chronic.  04-16-14: chest x-ray: No active cardiopulmonary disease.  04-17-14: doppler  bilateral lower extremities: - No evidence of deep vein thrombosis involving the visualized veins of the right lower extremity. - No evidence of deep vein thrombosis involving the visualized veins of the left  lower extremity. - No evidence of Baker&'s cyst on the right or left.  05-30-14: left lower extremity doppler: no dvt    LABS REVIEWED:   12-04-13: glucose 86; bun 22; creat 0.9; k+4.0; na++146  02-21-14: glucose 85; bun 20; creat 0.9; k+4.0; na++ 143; tsh 1.78 04-16-14: wbc 4.0; hgb 11.3; hct 34.6; mcv 101.5; plt 105; glucose 175; bun 28; creat 1.24; k+3.6; na++143; liver normal albumin 2.7 04-20-14: wbc 2.8; hgb 10.7; hct 32.3; mcv 99.1; plt 92; glucose 86; bun 9; creat 0.79; k+4.0; na++141; liver normal albumin 2.7  06-04-14: glucose 82; bun 34; creat 1.14; k+3.7; na++144 07-04-14: wbc 5.5; hgb 12.1; hct 36.3; mcv 96.5; plt 150; glucose 101; bun 18; creat 0.94; k+ 3.8; na++138; liver normal albumin 3.1   08-23-14: urine culture: proteus mirabilis: rocephin     Review of Systems Unable to  perform ROS: Dementia      Physical Exam Constitutional: No distress.  Eyes: Conjunctivae are normal.  Neck: Neck supple. No JVD present. No thyromegaly present.  Cardiovascular: Normal rate, regular rhythm and intact distal pulses.   Respiratory: Effort normal and breath sounds normal. No respiratory distress. She has no wheezes.  GI: Soft. Bowel sounds are normal. She exhibits no distension. There is no tenderness.  Musculoskeletal: She exhibits edema.  Able to move all extremities  2+ lower extremity edema   Lymphadenopathy:    She has no cervical adenopathy.  Neurological: She is alert.  Skin: Skin is warm and dry. She is not diaphoretic.  Psychiatric: She has a normal mood and affect.      ASSESSMENT/ PLAN:  1. Edema: is stable will continue lasix 40 mg twice  daily with k+ 20 meq twice daily and will monitor her status   2. Hypothyroidism: will continue synthroid 100 mcg daily tsh is 1.78  3.  Hypertension: is presently not on medications; will not make changes will  Monitor  4. Constipation: will continue lactulose 30 cc daily and miralax daily   5. Vascular dementia: no significant change in status; her current weight is 137 pounds; is presently not on medications; will not make changes will monitor  6. Depression: does receive benefit from cymbalta 30 mg daily and has ativan 0.5 mg twice daily as needed for anxiety     Will check cbc cmp tsh      Synthia Innocenteborah Green NP [facility]  Contact [phone] Monday through Friday 8am- 5pm  After hours call [phone]

## 2015-02-25 ENCOUNTER — Encounter: Payer: Self-pay | Admitting: Adult Health

## 2015-02-25 NOTE — Progress Notes (Signed)
Patient ID: Melanie Roberson, female   DOB: [birth year], 81 y.o.   MRN: [id number]    [facility]      Allergies  Allergen Reactions  . Erythromycin     Per MAR  . Penicillins     Per [facility]  . [facility] Antibiotics     [facility]    Chief Complaint  Patient presents with  . Medical Management of Chronic Issues    HPI:  She is a long term resident of this facility being seen for the management of her chronic illnesses. Overall there is little change in her status. She is unable to fully participate in the hpi or ros; but told me that she is feeling good. There are no nursing concerns at this time.    Past Medical History  Diagnosis Date  . MYALGIA 07/13/2007    Qualifier: Diagnosis of  By: Gavin PottersGRANDIS MD, HEIDI    . SCHIZOPHRENIA 04/29/2006    Qualifier: Diagnosis of  By: Erenest RasherVANSTORY MD, MADELEINE    . TREMOR, ESSENTIAL 05/28/2006    Qualifier: Diagnosis of  By: Erenest RasherVANSTORY MD, MADELEINE    . Insomnia 08/11/2013  . Anemia   . Thyroid disease   . Hyperlipidemia   . Hypertension   . Depression   . CHF (congestive heart failure) (HCC)     No past surgical history on file.  VITAL SIGNS BP 131/73 mmHg  Pulse 74  Ht 5\' 6"  (1.676 m)  Wt 137 lb (62.143 kg)  BMI 22.12 kg/m2  Patient's Medications  New Prescriptions   No medications on file  Previous Medications   CYANOCOBALAMIN 500 MCG TABLET    Take 500 mcg by mouth daily. For B-12 deficiency   DULOXETINE (CYMBALTA) 30 MG CAPSULE    Take 30 mg by mouth daily. For anxiety   FUROSEMIDE (LASIX) 40 MG TABLET    Take 0.5 tablets (20 mg total) by mouth daily.   IPRATROPIUM-ALBUTEROL (DUONEB) 0.5-2.5 (3) MG/3ML SOLN    Take 3 mLs by nebulization 3 (three) times daily as needed (wheezing).    LACTULOSE (CHRONULAC) 10 GM/15ML SOLUTION    Take 30 g by mouth every morning.    LEVOTHYROXINE (SYNTHROID, LEVOTHROID) 100 MCG TABLET    Take 100 mcg by mouth daily. For hypothyroidism   LORAZEPAM (ATIVAN) 0.5 MG TABLET    Take 0.5 mg by mouth 2  (two) times daily as needed for anxiety.   MULTIPLE VITAMINS-MINERALS (MULTIVITAMIN WITH MINERALS) TABLET    Take 1 tablet by mouth daily.   POLYETHYLENE GLYCOL POWDER (MIRALAX) POWDER    Take 17 g by mouth daily. For constipation. Dispense 1 month supply.   POLYVINYL ALCOHOL-POVIDONE (HYPOTEARS) 1.4-0.6 % OPHTHALMIC SOLUTION    Place 1 drop into both eyes 3 (three) times daily. For dry eyes   POTASSIUM CHLORIDE SA (K-DUR,KLOR-CON) 20 MEQ TABLET    Take 20 mEq by mouth 2 (two) times daily. For heart disease  Modified Medications   No medications on file  Discontinued Medications   No medications on file     SIGNIFICANT DIAGNOSTIC EXAMS   04-16-14: ct of head: 1. No acute intracranial findings. 2. Severe volume loss and chronic microvascular ischemic changes 3. Left maxillary sinus disease, likely chronic.  04-16-14: chest x-ray: No active cardiopulmonary disease.  04-17-14: doppler  bilateral lower extremities: - No evidence of deep vein thrombosis involving the visualized veins of the right lower extremity. - No evidence of deep vein thrombosis involving the visualized veins of the  left lower extremity. - No evidence of Baker&'s cyst on the right or left.  05-30-14: left lower extremity doppler: no dvt    LABS REVIEWED:    02-21-14: glucose 85; bun 20; creat 0.9; k+4.0; na++ 143; tsh 1.78 04-16-14: wbc 4.0; hgb 11.3; hct 34.6; mcv 101.5; plt 105; glucose 175; bun 28; creat 1.24; k+3.6; na++143; liver normal albumin 2.7 04-20-14: wbc 2.8; hgb 10.7; hct 32.3; mcv 99.1; plt 92; glucose 86; bun 9; creat 0.79; k+4.0; na++141; liver normal albumin 2.7  06-04-14: glucose 82; bun 34; creat 1.14; k+3.7; na++144 07-04-14: wbc 5.5; hgb 12.1; hct 36.3; mcv 96.5; plt 150; glucose 101; bun 18; creat 0.94; k+ 3.8; na++138; liver normal albumin 3.1   08-23-14: urine culture: proteus mirabilis: rocephin     Review of Systems Unable to perform ROS: Dementia      Physical Exam Constitutional: No  distress.  Eyes: Conjunctivae are normal.  Neck: Neck supple. No JVD present. No thyromegaly present.  Cardiovascular: Normal rate, regular rhythm and intact distal pulses.   Respiratory: Effort normal and breath sounds normal. No respiratory distress. She has no wheezes.  GI: Soft. Bowel sounds are normal. She exhibits no distension. There is no tenderness.  Musculoskeletal: She exhibits edema.  Able to move all extremities  2+ lower extremity edema   Lymphadenopathy:    She has no cervical adenopathy.  Neurological: She is alert.  Skin: Skin is warm and dry. She is not diaphoretic.  Psychiatric: She has a normal mood and affect.      ASSESSMENT/ PLAN:  1. Edema: is stable will continue lasix 40 mg twice  daily with k+ 20 meq twice daily and will monitor her status   2. Hypothyroidism: will continue synthroid 100 mcg daily tsh is 1.78  3.  Hypertension: is presently not on medications; will not make changes will  Monitor  4. Constipation: will continue lactulose 30 cc daily and miralax daily   5. Vascular dementia: no significant change in status; her current weight is 137 pounds; is presently not on medications; will not make changes will monitor  6. Depression: does receive benefit from cymbalta 30 mg daily and has ativan 0.5 mg twice daily as needed for anxiety    Will check cbc; cmp tsh      Synthia Innocent NP [facility]  Contact [phone] Monday through Friday 8am- 5pm  After hours call [phone]

## 2015-03-07 ENCOUNTER — Non-Acute Institutional Stay (SKILLED_NURSING_FACILITY): Payer: Medicare Other | Admitting: Adult Health

## 2015-03-07 DIAGNOSIS — F0151 Vascular dementia with behavioral disturbance: Secondary | ICD-10-CM

## 2015-03-07 DIAGNOSIS — R600 Localized edema: Secondary | ICD-10-CM | POA: Diagnosis not present

## 2015-03-07 DIAGNOSIS — I509 Heart failure, unspecified: Secondary | ICD-10-CM | POA: Diagnosis not present

## 2015-03-07 DIAGNOSIS — I10 Essential (primary) hypertension: Secondary | ICD-10-CM

## 2015-03-07 DIAGNOSIS — F0280 Dementia in other diseases classified elsewhere without behavioral disturbance: Secondary | ICD-10-CM | POA: Diagnosis not present

## 2015-03-07 DIAGNOSIS — F329 Major depressive disorder, single episode, unspecified: Secondary | ICD-10-CM

## 2015-03-07 DIAGNOSIS — K5901 Slow transit constipation: Secondary | ICD-10-CM | POA: Diagnosis not present

## 2015-03-07 DIAGNOSIS — F01518 Vascular dementia, unspecified severity, with other behavioral disturbance: Secondary | ICD-10-CM

## 2015-03-07 DIAGNOSIS — F0393 Unspecified dementia, unspecified severity, with mood disturbance: Secondary | ICD-10-CM

## 2015-04-08 ENCOUNTER — Encounter: Payer: Self-pay | Admitting: Adult Health

## 2015-04-08 NOTE — Progress Notes (Signed)
Patient ID: Melanie Roberson, female   DOB: Aug 31, 1923, 82 y.o.   MRN: [id number]    [facility]       Allergies  Allergen Reactions  . Erythromycin     Per MAR  . Penicillins     Per MAR  . Sulfa Antibiotics     Per [facility]   Chief Complaint  Patient presents with  . Annual Exam    HPI:  She is a long term resident of this facility being seen for her annual exam. She is without significant change over the past several months. She was hospitalized marsh 2016 for left lower leg cellulitis. She is unable to fully participate in the hpi or ros. There are no nursing concerns at this time   Past Medical History  Diagnosis Date  . MYALGIA 07/13/2007    Qualifier: Diagnosis of  By: Gavin Potters MD, HEIDI    . SCHIZOPHRENIA 04/29/2006    Qualifier: Diagnosis of  By: Erenest Rasher MD, MADELEINE    . TREMOR, ESSENTIAL 05/28/2006    Qualifier: Diagnosis of  By: Erenest Rasher MD, MADELEINE    . Insomnia 08/11/2013  . Anemia   . Thyroid disease   . Hyperlipidemia   . Hypertension   . Depression   . CHF (congestive heart failure) (HCC)     History reviewed. No pertinent past surgical history.    Family History  Problem Relation Age of Onset  . Family history unknown: Yes    Social History   Social History  . Marital Status: Widowed    Spouse Name: N/A  . Number of Children: N/A  . Years of Education: N/A   Occupational History  . Not on file.   Social History Main Topics  . Smoking status: Former Games developer  . Smokeless tobacco: Current User    Types: Snuff  . Alcohol Use: No  . Drug Use: No  . Sexual Activity: No   Other Topics Concern  . Not on file   Social History Narrative     VITAL SIGNS BP 122/60 mmHg  Pulse 78  Temp(Src) 97.5 F (36.4 C)  Ht  (1.676 m)  Wt 137 lb (62.143 kg)  BMI 22.12 kg/m2  SpO2 96%  Patient's Medications  New Prescriptions   No medications on file  Previous Medications   CYANOCOBALAMIN 500 MCG TABLET    Take 500 mcg by mouth  daily. For B-12 deficiency   DULOXETINE (CYMBALTA) 30 MG CAPSULE    Take 30 mg by mouth daily. For anxiety   FUROSEMIDE (LASIX) 40 MG TABLET    Take 0.5 tablets (20 mg total) by mouth daily.   IPRATROPIUM-ALBUTEROL (DUONEB) 0.5-2.5 (3) MG/3ML SOLN    Take 3 mLs by nebulization 3 (three) times daily as needed (wheezing).    LACTULOSE (CHRONULAC) 10 GM/15ML SOLUTION    Take 30 g by mouth every morning.    LEVOTHYROXINE (SYNTHROID, LEVOTHROID) 100 MCG TABLET    Take 100 mcg by mouth daily. For hypothyroidism   LORAZEPAM (ATIVAN) 0.5 MG TABLET    Take 0.5 mg by mouth 2 (two) times daily as needed for anxiety.   MULTIPLE VITAMINS-MINERALS (MULTIVITAMIN WITH MINERALS) TABLET    Take 1 tablet by mouth daily.   POLYETHYLENE GLYCOL POWDER (MIRALAX) POWDER    Take 17 g by mouth daily. For constipation. Dispense 1 month supply.   POLYVINYL ALCOHOL-POVIDONE (HYPOTEARS) 1.4-0.6 % OPHTHALMIC SOLUTION    Place 1 drop into both eyes 3 (three) times daily. For dry  eyes   POTASSIUM CHLORIDE SA (K-DUR,KLOR-CON) 20 MEQ TABLET    Take 20 mEq by mouth 2 (two) times daily. For heart disease  Modified Medications   No medications on file  Discontinued Medications   No medications on file     SIGNIFICANT DIAGNOSTIC EXAMS   04-16-14: ct of head: 1. No acute intracranial findings. 2. Severe volume loss and chronic microvascular ischemic changes 3. Left maxillary sinus disease, likely chronic.  04-16-14: chest x-ray: No active cardiopulmonary disease.  04-17-14: doppler  bilateral lower extremities: - No evidence of deep vein thrombosis involving the visualized veins of the right lower extremity. - No evidence of deep vein thrombosis involving the visualized veins of the left lower extremity. - No evidence of Baker&'s cyst on the right or left.  05-30-14: left lower extremity doppler: no dvt    LABS REVIEWED:    04-16-14: wbc 4.0; hgb 11.3; hct 34.6; mcv 101.5; plt 105; glucose 175; bun 28; creat 1.24; k+3.6;  na++143; liver normal albumin 2.7 04-20-14: wbc 2.8; hgb 10.7; hct 32.3; mcv 99.1; plt 92; glucose 86; bun 9; creat 0.79; k+4.0; na++141; liver normal albumin 2.7  06-04-14: glucose 82; bun 34; creat 1.14; k+3.7; na++144 07-04-14: wbc 5.5; hgb 12.1; hct 36.3; mcv 96.5; plt 150; glucose 101; bun 18; creat 0.94; k+ 3.8; na++138; liver normal albumin 3.1   08-23-14: urine culture: proteus mirabilis: rocephin  01-14-15: wbc 6.5; hgb 13.0; hct 38.6; mcv 97.7; plt 145; glcuose 61; bun 20; creat 1.00; k+ 4.1; na++144; liver normal albumin 3.4; tsh 1.263     Review of Systems Unable to perform ROS: Dementia      Physical Exam Constitutional: No distress.  Eyes: Conjunctivae are normal.  Neck: Neck supple. No JVD present. No thyromegaly present.  Cardiovascular: Normal rate, regular rhythm and intact distal pulses.   Respiratory: Effort normal and breath sounds normal. No respiratory distress. She has no wheezes.  GI: Soft. Bowel sounds are normal. She exhibits no distension. There is no tenderness.  Musculoskeletal: She exhibits edema.  Able to move all extremities  2+ lower extremity edema   Lymphadenopathy:    She has no cervical adenopathy.  Neurological: She is alert.  Skin: Skin is warm and dry. She is not diaphoretic.  Psychiatric: She has a normal mood and affect.      ASSESSMENT/ PLAN:  1. Edema: is stable will continue lasix 40 mg twice  daily with k+ 20 meq twice daily and will monitor her status   2. Hypothyroidism: will continue synthroid 100 mcg daily tsh is 1.263  3.  Hypertension: is presently not on medications; will not make changes will  Monitor  4. Constipation: will continue lactulose 30 cc daily and miralax daily   5. Vascular dementia: no significant change in status; her current weight is 137 pounds; is presently not on medications; will not make changes will monitor  6. Depression: does receive benefit from cymbalta 30 mg daily and has ativan 0.5 mg twice daily  as needed for anxiety   7. CHF: is stable will continue lasix 40 mg twice daily with k+ 20 meq twice daily  8. Schizophrenia: is without change in her status. Is not on antipsychotic medications will monitor    Her health maintenance is up to date   Time spent with patient  45   minutes >50% time spent counseling; reviewing medical record; tests; labs; and developing future plan of care      Synthia Innocent NP [facility]  Contact [phone] Monday through Friday 8am- 5pm  After hours call [phone]

## 2015-05-06 ENCOUNTER — Non-Acute Institutional Stay (SKILLED_NURSING_FACILITY): Payer: Medicare Other | Admitting: Adult Health

## 2015-05-06 ENCOUNTER — Encounter: Payer: Self-pay | Admitting: Adult Health

## 2015-05-06 DIAGNOSIS — R634 Abnormal weight loss: Secondary | ICD-10-CM | POA: Diagnosis not present

## 2015-05-06 DIAGNOSIS — F329 Major depressive disorder, single episode, unspecified: Secondary | ICD-10-CM

## 2015-05-06 DIAGNOSIS — E034 Atrophy of thyroid (acquired): Secondary | ICD-10-CM | POA: Diagnosis not present

## 2015-05-06 DIAGNOSIS — E038 Other specified hypothyroidism: Secondary | ICD-10-CM | POA: Diagnosis not present

## 2015-05-06 DIAGNOSIS — I10 Essential (primary) hypertension: Secondary | ICD-10-CM | POA: Diagnosis not present

## 2015-05-06 DIAGNOSIS — R627 Adult failure to thrive: Secondary | ICD-10-CM | POA: Diagnosis not present

## 2015-05-06 DIAGNOSIS — R600 Localized edema: Secondary | ICD-10-CM | POA: Diagnosis not present

## 2015-05-06 DIAGNOSIS — F0151 Vascular dementia with behavioral disturbance: Secondary | ICD-10-CM

## 2015-05-06 DIAGNOSIS — F0280 Dementia in other diseases classified elsewhere without behavioral disturbance: Secondary | ICD-10-CM | POA: Diagnosis not present

## 2015-05-06 DIAGNOSIS — F0393 Unspecified dementia, unspecified severity, with mood disturbance: Secondary | ICD-10-CM

## 2015-05-06 DIAGNOSIS — F01518 Vascular dementia, unspecified severity, with other behavioral disturbance: Secondary | ICD-10-CM

## 2015-05-06 DIAGNOSIS — K5901 Slow transit constipation: Secondary | ICD-10-CM | POA: Diagnosis not present

## 2015-05-06 DIAGNOSIS — I509 Heart failure, unspecified: Secondary | ICD-10-CM | POA: Diagnosis not present

## 2015-05-06 MED ORDER — MIRTAZAPINE 15 MG PO TABS: 7.5000 mg | ORAL_TABLET | Freq: Every day | ORAL | Status: DC

## 2015-05-06 NOTE — Progress Notes (Signed)
Patient ID: Melanie Roberson, female   DOB: Nov 02, 1923, 82 y.o.   MRN: [id number]   [facility]       Allergies  Allergen Reactions  . Erythromycin     Per MAR  . Penicillins     Per MAR  . Sulfa Antibiotics     Per [facility]    Chief Complaint  Patient presents with  . Medical Management of Chronic Issues    Follow up    HPI:  She is a long term resident of this facility being seen for the management of her chronic illnesses. She is losing weight. Her current weight is 127 pounds from  her weight in July 2016 of 138 pounds. Staff reports that she tends to eat her snuff and at times will not eat her meals in preference to snuff. She is unable to fully participate in the hpi or ros.    Past Medical History  Diagnosis Date  . MYALGIA 07/13/2007    Qualifier: Diagnosis of  By: Gavin Potters MD, HEIDI    . SCHIZOPHRENIA 04/29/2006    Qualifier: Diagnosis of  By: Erenest Rasher MD, MADELEINE    . TREMOR, ESSENTIAL 05/28/2006    Qualifier: Diagnosis of  By: Erenest Rasher MD, MADELEINE    . Insomnia 08/11/2013  . Anemia   . Thyroid disease   . Hyperlipidemia   . Hypertension   . Depression   . CHF (congestive heart failure) (HCC)     History reviewed. No pertinent past surgical history.  VITAL SIGNS BP 140/61 mmHg  Pulse 73  Temp(Src) 97.6 F (36.4 C) (Oral)  Resp 18  Ht  (1.676 m)  Wt 127 lb 4 oz (57.72 kg)  BMI 20.55 kg/m2  SpO2 95%  Patient's Medications  New Prescriptions   No medications on file  Previous Medications   CYANOCOBALAMIN 500 MCG TABLET    Take 500 mcg by mouth daily. For B-12 deficiency   DULOXETINE (CYMBALTA) 30 MG CAPSULE    Take 30 mg by mouth daily. For anxiety   FUROSEMIDE (LASIX) 40 MG TABLET    Take 40 mg by mouth 2 (two) times daily.   IPRATROPIUM-ALBUTEROL (DUONEB) 0.5-2.5 (3) MG/3ML SOLN    Take 3 mLs by nebulization 3 (three) times daily as needed (wheezing).    LACTULOSE (CHRONULAC) 10 GM/15ML SOLUTION    Take 30 g by mouth every morning.      LEVOTHYROXINE (SYNTHROID, LEVOTHROID) 100 MCG TABLET    Take 100 mcg by mouth daily. For hypothyroidism   LORAZEPAM (ATIVAN) 0.5 MG TABLET    Take 0.5 mg by mouth 2 (two) times daily as needed for anxiety.   MULTIPLE VITAMINS-MINERALS (MULTIVITAMIN WITH MINERALS) TABLET    Take 1 tablet by mouth daily.   POLYETHYLENE GLYCOL POWDER (MIRALAX) POWDER    Take 17 g by mouth daily. For constipation. Dispense 1 month supply.   POLYVINYL ALCOHOL-POVIDONE (HYPOTEARS) 1.4-0.6 % OPHTHALMIC SOLUTION    Place 1 drop into both eyes 3 (three) times daily. For dry eyes   POTASSIUM CHLORIDE SA (K-DUR,KLOR-CON) 20 MEQ TABLET    Take 20 mEq by mouth 2 (two) times daily. For heart disease  Modified Medications   No medications on file  Discontinued Medications   FUROSEMIDE (LASIX) 40 MG TABLET    Take 0.5 tablets (20 mg total) by mouth daily.     SIGNIFICANT DIAGNOSTIC EXAMS  04-16-14: ct of head: 1. No acute intracranial findings. 2. Severe volume loss and chronic microvascular ischemic changes  3. Left maxillary sinus disease, likely chronic.  04-16-14: chest x-ray: No active cardiopulmonary disease.  04-17-14: doppler  bilateral lower extremities: - No evidence of deep vein thrombosis involving the visualized veins of the right lower extremity. - No evidence of deep vein thrombosis involving the visualized veins of the left lower extremity. - No evidence of Baker&'s cyst on the right or left.  05-30-14: left lower extremity doppler: no dvt    LABS REVIEWED:    06-04-14: glucose 82; bun 34; creat 1.14; k+3.7; na++144 07-04-14: wbc 5.5; hgb 12.1; hct 36.3; mcv 96.5; plt 150; glucose 101; bun 18; creat 0.94; k+ 3.8; na++138; liver normal albumin 3.1   08-23-14: urine culture: proteus mirabilis: rocephin  01-14-15: wbc 6.5; hgb 13.0; hct 38.6; mcv 97.7; plt 145; glcuose 61; bun 20; creat 1.00; k+ 4.1; na++144; liver normal albumin 3.4; tsh 1.263      Review of Systems Unable to perform ROS: Dementia       Physical Exam Constitutional: No distress.  Eyes: Conjunctivae are normal.  Neck: Neck supple. No JVD present. No thyromegaly present.  Cardiovascular: Normal rate, regular rhythm and intact distal pulses.   Respiratory: Effort normal and breath sounds normal. No respiratory distress. She has no wheezes.  GI: Soft. Bowel sounds are normal. She exhibits no distension. There is no tenderness.  Musculoskeletal: She exhibits edema.  Able to move all extremities  1+ lower extremity edema   Lymphadenopathy:    She has no cervical adenopathy.  Neurological: She is alert.  Skin: Skin is warm and dry. She is not diaphoretic.  Psychiatric: She has a normal mood and affect.      ASSESSMENT/ PLAN:  1. Lower extremity edema: is stable will continue lasix 40 mg twice  daily with k+ 20 meq twice daily and will monitor her status   2. Hypothyroidism: will continue synthroid 100 mcg daily tsh is 1.263  3.  Hypertension: is presently not on medications; will not make changes will  Monitor  4. Constipation: will continue lactulose 30 cc daily and miralax daily   5. Vascular dementia: no significant change in status; her current weight is 127 pounds; is presently not on medications; weight loss is an unfortunate outcome of end stage dementia.   6. Depression: does receive benefit from cymbalta 30 mg daily and has ativan 0.5 mg twice daily as needed for anxiety   7. CHF: is stable will continue lasix 40 mg twice daily with k+ 20 meq twice daily  8. Schizophrenia: is without change in her status. Is not on antipsychotic medications will monitor   9. Weight loss with FTT: her current weight is 127 pounds. Is on supplements per facility protocol. Will begin remeron 7.5 mg nightly for 30 days to help with appetite.       Synthia Innocenteborah Henny Strauch NP [facility]  Contact [phone] Monday through Friday 8am- 5pm  After hours call [phone]

## 2015-06-06 ENCOUNTER — Non-Acute Institutional Stay (SKILLED_NURSING_FACILITY): Payer: Medicare Other | Admitting: Adult Health

## 2015-06-06 DIAGNOSIS — E034 Atrophy of thyroid (acquired): Secondary | ICD-10-CM

## 2015-06-06 DIAGNOSIS — R634 Abnormal weight loss: Secondary | ICD-10-CM

## 2015-06-06 DIAGNOSIS — F0393 Unspecified dementia, unspecified severity, with mood disturbance: Secondary | ICD-10-CM

## 2015-06-06 DIAGNOSIS — F0280 Dementia in other diseases classified elsewhere without behavioral disturbance: Secondary | ICD-10-CM

## 2015-06-06 DIAGNOSIS — R600 Localized edema: Secondary | ICD-10-CM | POA: Diagnosis not present

## 2015-06-06 DIAGNOSIS — E038 Other specified hypothyroidism: Secondary | ICD-10-CM | POA: Diagnosis not present

## 2015-06-06 DIAGNOSIS — F209 Schizophrenia, unspecified: Secondary | ICD-10-CM | POA: Diagnosis not present

## 2015-06-06 DIAGNOSIS — I10 Essential (primary) hypertension: Secondary | ICD-10-CM | POA: Diagnosis not present

## 2015-06-06 DIAGNOSIS — R627 Adult failure to thrive: Secondary | ICD-10-CM

## 2015-06-06 DIAGNOSIS — F329 Major depressive disorder, single episode, unspecified: Secondary | ICD-10-CM | POA: Diagnosis not present

## 2015-06-12 ENCOUNTER — Encounter: Payer: Self-pay | Admitting: Adult Health

## 2015-06-12 NOTE — Progress Notes (Signed)
Patient ID: Melanie PiperHelen M Braddock, female   DOB: 02/05/1924, 82 y.o.   MRN: [id number]   [facility]       Allergies  Allergen Reactions  . Erythromycin     Per MAR  . Penicillins     Per [facility]  . [facility]     [facility]    Chief Complaint  Patient presents with  . Medical Management of Chronic Issues    Follow up    HPI:  She is a long term resident of this facility being seen for the management of her chronic illnesses. Overall there is little change in her status. She has been losing weight her current weight is 128.2 pounds in Oct 2016 weight was 137 pounds. She has been on remeron recently for her weight loss.. She does use snuff and will at times use snuff rather than eat food.    Past Medical History  Diagnosis Date  . MYALGIA 07/13/2007    Qualifier: Diagnosis of  By: Gavin PottersGRANDIS MD, HEIDI    . SCHIZOPHRENIA 04/29/2006    Qualifier: Diagnosis of  By: Erenest RasherVANSTORY MD, MADELEINE    . TREMOR, ESSENTIAL 05/28/2006    Qualifier: Diagnosis of  By: Erenest RasherVANSTORY MD, MADELEINE    . Insomnia 08/11/2013  . Anemia   . Thyroid disease   . Hyperlipidemia   . Hypertension   . Depression   . CHF (congestive heart failure) (HCC)     History reviewed. No pertinent past surgical history.  VITAL SIGNS BP 122/66 mmHg  Pulse 88  Ht 5\' 6"  (1.676 m)  Wt 128 lb 2 oz (58.117 kg)  BMI 20.69 kg/m2  Patient's Medications  New Prescriptions   No medications on file  Previous Medications   CYANOCOBALAMIN 500 MCG TABLET    Take 500 mcg by mouth daily. For B-12 deficiency   DULOXETINE (CYMBALTA) 30 MG CAPSULE    Take 30 mg by mouth daily. For anxiety   FUROSEMIDE (LASIX) 40 MG TABLET    Take 40 mg by mouth 2 (two) times daily.   GUAIFENESIN (ROBITUSSIN) 100 MG/5ML LIQUID    Take 200 mg by mouth every 6 (six) hours as needed for cough. Reported on 06/12/2015   IPRATROPIUM-ALBUTEROL (DUONEB) 0.5-2.5 (3) MG/3ML SOLN    Take 3 mLs by nebulization 3 (three) times daily as needed (wheezing).      LACTULOSE (CHRONULAC) 10 GM/15ML SOLUTION    Take 30 g by mouth every morning.    LEVOTHYROXINE (SYNTHROID, LEVOTHROID) 100 MCG TABLET    Take 100 mcg by mouth daily. For hypothyroidism   LORAZEPAM (ATIVAN) 0.5 MG TABLET    Take 0.5 mg by mouth 2 (two) times daily as needed for anxiety.   MAGNESIUM HYDROXIDE (MILK OF MAGNESIA) 400 MG/5ML SUSPENSION    Take 30 mLs by mouth daily as needed for mild constipation.   MIRTAZAPINE (REMERON) 15 MG TABLET    Take 0.5 tablets (7.5 mg total) by mouth at bedtime.   MULTIPLE VITAMINS-MINERALS (MULTIVITAMIN WITH MINERALS) TABLET    Take 1 tablet by mouth daily.   POLYETHYLENE GLYCOL POWDER (MIRALAX) POWDER    Take 17 g by mouth daily. For constipation. Dispense 1 month supply.   POLYVINYL ALCOHOL-POVIDONE (HYPOTEARS) 1.4-0.6 % OPHTHALMIC SOLUTION    Place 1 drop into both eyes 3 (three) times daily. Reported on 06/12/2015   POTASSIUM CHLORIDE SA (K-DUR,KLOR-CON) 20 MEQ TABLET    Take 20 mEq by mouth 2 (two) times daily. For heart disease  Modified  Medications   No medications on file  Discontinued Medications   No medications on file     SIGNIFICANT DIAGNOSTIC EXAMS  04-16-14: ct of head: 1. No acute intracranial findings. 2. Severe volume loss and chronic microvascular ischemic changes 3. Left maxillary sinus disease, likely chronic.  04-16-14: chest x-ray: No active cardiopulmonary disease.  04-17-14: doppler  bilateral lower extremities: - No evidence of deep vein thrombosis involving the visualized veins of the right lower extremity. - No evidence of deep vein thrombosis involving the visualized veins of the left lower extremity. - No evidence of Baker&'s cyst on the right or left.  05-30-14: left lower extremity doppler: no dvt     LABS REVIEWED:    06-04-14: glucose 82; bun 34; creat 1.14; k+3.7; na++144 07-04-14: wbc 5.5; hgb 12.1; hct 36.3; mcv 96.5; plt 150; glucose 101; bun 18; creat 0.94; k+ 3.8; na++138; liver normal albumin 3.1   08-23-14:  urine culture: proteus mirabilis: rocephin  01-14-15: wbc 6.5; hgb 13.0; hct 38.6; mcv 97.7; plt 145; glcuose 61; bun 20; creat 1.00; k+ 4.1; na++144; liver normal albumin 3.4; tsh 1.263      Review of Systems Unable to perform ROS: Dementia      Physical Exam Constitutional: No distress.  Eyes: Conjunctivae are normal.  Neck: Neck supple. No JVD present. No thyromegaly present.  Cardiovascular: Normal rate, regular rhythm and intact distal pulses.   Respiratory: Effort normal and breath sounds normal. No respiratory distress. She has no wheezes.  GI: Soft. Bowel sounds are normal. She exhibits no distension. There is no tenderness.  Musculoskeletal: She exhibits edema.  Able to move all extremities  1+ lower extremity edema   Lymphadenopathy:    She has no cervical adenopathy.  Neurological: She is alert.  Skin: Skin is warm and dry. She is not diaphoretic.  Psychiatric: She has a normal mood and affect.      ASSESSMENT/ PLAN:  1. Lower extremity edema: is stable will continue lasix 40 mg twice  daily with k+ 20 meq twice daily and will monitor her status   2. Hypothyroidism: will continue synthroid 100 mcg daily tsh is 1.263  3.  Hypertension: is presently not on medications; will not make changes will  Monitor  4. Constipation: will continue lactulose 30 cc daily and miralax daily   5. Vascular dementia: no significant change in status; her current weight is 128 pounds; is presently not on medications; weight loss is an unfortunate outcome of end stage dementia.   6. Depression: does receive benefit from cymbalta 30 mg daily and has ativan 0.5 mg twice daily as needed for anxiety   7. CHF: is stable will continue lasix 40 mg twice daily with k+ 20 meq twice daily  8. Schizophrenia: is without change in her status. Is not on antipsychotic medications will monitor   9. Weight loss with FTT: her current weight is 128 pounds. Is on supplements per facility protocol.         Synthia Innocent NP [facility]  Contact [phone] Monday through Friday 8am- 5pm  After hours call [phone]

## 2015-06-20 ENCOUNTER — Encounter: Payer: Self-pay | Admitting: Adult Health

## 2015-06-20 ENCOUNTER — Non-Acute Institutional Stay (SKILLED_NURSING_FACILITY): Payer: Medicare Other | Admitting: Adult Health

## 2015-06-20 DIAGNOSIS — F0151 Vascular dementia with behavioral disturbance: Secondary | ICD-10-CM | POA: Diagnosis not present

## 2015-06-20 DIAGNOSIS — R627 Adult failure to thrive: Secondary | ICD-10-CM

## 2015-06-20 DIAGNOSIS — R634 Abnormal weight loss: Secondary | ICD-10-CM | POA: Diagnosis not present

## 2015-06-20 DIAGNOSIS — F01518 Vascular dementia, unspecified severity, with other behavioral disturbance: Secondary | ICD-10-CM

## 2015-06-20 MED ORDER — DRONABINOL 2.5 MG PO CAPS: 2.5000 mg | ORAL_CAPSULE | Freq: Every day | ORAL | Status: DC

## 2015-06-20 NOTE — Progress Notes (Signed)
Patient ID: Melanie Roberson, female   DOB: 1923-12-17, 82 y.o.   MRN: [id number]   [facility]       Allergies  Allergen Reactions  . Erythromycin     Per MAR  . Penicillins     Per MAR  . Sulfa Antibiotics     Per [facility]    Chief Complaint  Patient presents with  . Acute Visit    Weight Loss    HPI:  She continues to lose weight. Her weight in Oct 2016 was 137 pounds her current weight is 125 pounds. Her dementia is advanced and an unfortunate outcome at this stage is weight loss. She has tried remeron and continues to lose weight with this medication. Staff does tell me that she will use snuff rather than eat at times.     Past Medical History  Diagnosis Date  . MYALGIA 07/13/2007    Qualifier: Diagnosis of  By: Gavin Potters MD, HEIDI    . SCHIZOPHRENIA 04/29/2006    Qualifier: Diagnosis of  By: Erenest Rasher MD, MADELEINE    . TREMOR, ESSENTIAL 05/28/2006    Qualifier: Diagnosis of  By: Erenest Rasher MD, MADELEINE    . Insomnia 08/11/2013  . Anemia   . Thyroid disease   . Hyperlipidemia   . Hypertension   . Depression   . CHF (congestive heart failure) (HCC)     History reviewed. No pertinent past surgical history.  VITAL SIGNS BP 114/76 mmHg  Pulse 70  Temp(Src) 97 F (36.1 C) (Oral)  Resp 18  Ht  (1.676 m)  Wt 125 lb 3 oz (56.785 kg)  BMI 20.22 kg/m2  SpO2 97%  Patient's Medications  New Prescriptions   No medications on file  Previous Medications   CYANOCOBALAMIN 500 MCG TABLET    Take 500 mcg by mouth daily. For B-12 deficiency   DULOXETINE (CYMBALTA) 30 MG CAPSULE    Take 30 mg by mouth daily. For anxiety   FUROSEMIDE (LASIX) 40 MG TABLET    Take 40 mg by mouth 2 (two) times daily.   GUAIFENESIN (ROBITUSSIN) 100 MG/5ML LIQUID    Take 200 mg by mouth every 6 (six) hours as needed for cough. Reported on 06/12/2015   IPRATROPIUM-ALBUTEROL (DUONEB) 0.5-2.5 (3) MG/3ML SOLN    Take 3 mLs by nebulization 3 (three) times daily as needed (wheezing).    LACTULOSE (CHRONULAC) 10 GM/15ML SOLUTION    Take 30 g by mouth every morning.    LEVOTHYROXINE (SYNTHROID, LEVOTHROID) 100 MCG TABLET    Take 100 mcg by mouth daily. For hypothyroidism   LORAZEPAM (ATIVAN) 0.5 MG TABLET    Take 0.5 mg by mouth 2 (two) times daily as needed for anxiety.   MAGNESIUM HYDROXIDE (MILK OF MAGNESIA) 400 MG/5ML SUSPENSION    Take 30 mLs by mouth daily as needed for mild constipation.   MULTIPLE VITAMINS-MINERALS (MULTIVITAMIN WITH MINERALS) TABLET    Take 1 tablet by mouth daily.   POLYETHYLENE GLYCOL POWDER (MIRALAX) POWDER    Take 17 g by mouth daily. For constipation. Dispense 1 month supply.   POLYVINYL ALCOHOL-POVIDONE (HYPOTEARS) 1.4-0.6 % OPHTHALMIC SOLUTION    Place 1 drop into both eyes 3 (three) times daily. Reported on 06/12/2015   POTASSIUM CHLORIDE SA (K-DUR,KLOR-CON) 20 MEQ TABLET    Take 20 mEq by mouth 2 (two) times daily. For heart disease  Modified Medications   No medications on file  Discontinued Medications   No medications on file  SIGNIFICANT DIAGNOSTIC EXAMS  04-16-14: ct of head: 1. No acute intracranial findings. 2. Severe volume loss and chronic microvascular ischemic changes 3. Left maxillary sinus disease, likely chronic.  04-16-14: chest x-ray: No active cardiopulmonary disease.  04-17-14: doppler  bilateral lower extremities: - No evidence of deep vein thrombosis involving the visualized veins of the right lower extremity. - No evidence of deep vein thrombosis involving the visualized veins of the left lower extremity. - No evidence of Baker&'s cyst on the right or left.  05-30-14: left lower extremity doppler: no dvt    LABS REVIEWED:    06-04-14: glucose 82; bun 34; creat 1.14; k+3.7; na++144 07-04-14: wbc 5.5; hgb 12.1; hct 36.3; mcv 96.5; plt 150; glucose 101; bun 18; creat 0.94; k+ 3.8; na++138; liver normal albumin 3.1   08-23-14: urine culture: proteus mirabilis: rocephin  01-14-15: wbc 6.5; hgb 13.0; hct 38.6; mcv 97.7; plt  145; glcuose 61; bun 20; creat 1.00; k+ 4.1; na++144; liver normal albumin 3.4; tsh 1.263     Review of Systems Unable to perform ROS: Dementia      Physical Exam Constitutional: No distress.  Eyes: Conjunctivae are normal.  Neck: Neck supple. No JVD present. No thyromegaly present.  Cardiovascular: Normal rate, regular rhythm and intact distal pulses.   Respiratory: Effort normal and breath sounds normal. No respiratory distress. She has no wheezes.  GI: Soft. Bowel sounds are normal. She exhibits no distension. There is no tenderness.  Musculoskeletal: She exhibits edema.  Able to move all extremities  1+ lower extremity edema   Lymphadenopathy:    She has no cervical adenopathy.  Neurological: She is alert.  Skin: Skin is warm and dry. She is not diaphoretic.  Psychiatric: She has a normal mood and affect.      ASSESSMENT/ PLAN:   1. Vascular dementia: no significant change in status; her current weight is 125 pounds; is presently not on medications; weight loss is an unfortunate outcome of end stage dementia.   2. Weight loss with FTT: her current weight is 125 pounds. Is on supplements per facility protocol.  Will begin marinol 2.5 mg daily prior to lunch for 30 days and will setup a palliative care consult for her.    Will check cbc; cmp      Synthia Innocenteborah Green NP [facility]  Contact [phone] Monday through Friday 8am- 5pm  After hours call [phone]

## 2015-06-21 LAB — HEPATIC FUNCTION PANEL
ALT: 84 U/L — AB (ref 7–35)
AST: 18 U/L (ref 13–35)
Alkaline Phosphatase: 84 U/L (ref 25–125)

## 2015-06-24 ENCOUNTER — Encounter: Payer: Self-pay | Admitting: Adult Health

## 2015-06-24 ENCOUNTER — Non-Acute Institutional Stay (SKILLED_NURSING_FACILITY): Payer: Medicare Other | Admitting: Adult Health

## 2015-06-24 DIAGNOSIS — E876 Hypokalemia: Secondary | ICD-10-CM | POA: Diagnosis not present

## 2015-06-24 MED ORDER — POTASSIUM CHLORIDE CRYS ER 20 MEQ PO TBCR: 20.0000 meq | EXTENDED_RELEASE_TABLET | Freq: Three times a day (TID) | ORAL | Status: DC

## 2015-06-24 NOTE — Progress Notes (Signed)
Patient ID: Melanie Roberson, female   DOB: 1923/11/27, 82 y.o.   MRN: [id number]   [facility]       Allergies  Allergen Reactions  . Erythromycin     Per MAR  . Penicillins     Per MAR  . [facility]ulfa Antibiotics     [facility]    Chief Complaint  Patient presents with  . Acute Visit    Hypokalemia    HPI:  Her k+ is 3.3 on 06-21-15. She will require her k dur dose to be adjusted. She is unable to fully participate in the hpi or ros; but did tell me that she is feeling good. There are no nursing concerns at this time.   Past Medical History  Diagnosis Date  . MYALGIA 07/13/2007    Qualifier: Diagnosis of  By: Gavin PottersGRANDIS MD, HEIDI    . SCHIZOPHRENIA 04/29/2006    Qualifier: Diagnosis of  By: Erenest RasherVANSTORY MD, MADELEINE    . TREMOR, ESSENTIAL 05/28/2006    Qualifier: Diagnosis of  By: Erenest RasherVANSTORY MD, MADELEINE    . Insomnia 08/11/2013  . Anemia   . Thyroid disease   . Hyperlipidemia   . Hypertension   . Depression   . CHF (congestive heart failure) (HCC)     History reviewed. No pertinent past surgical history.  VITAL SIGNS BP 114/76 mmHg  Pulse 70  Temp(Src) 97.5 F (36.4 C) (Oral)  Resp 18  Ht 5\' 6"  (1.676 m)  Wt 125 lb 3 oz (56.785 kg)  BMI 20.22 kg/m2  SpO2 97%  Patient's Medications  New Prescriptions   No medications on file  Previous Medications   CYANOCOBALAMIN 500 MCG TABLET    Take 500 mcg by mouth daily. For B-12 deficiency   DRONABINOL (MARINOL) 2.5 MG CAPSULE    Take 1 capsule (2.5 mg total) by mouth daily before lunch.   DULOXETINE (CYMBALTA) 30 MG CAPSULE    Take 30 mg by mouth daily. For anxiety   FUROSEMIDE (LASIX) 40 MG TABLET    Take 40 mg by mouth 2 (two) times daily.   GUAIFENESIN (ROBITUSSIN) 100 MG/5ML LIQUID    Take 200 mg by mouth every 6 (six) hours as needed for cough. Reported on 06/12/2015   IPRATROPIUM-ALBUTEROL (DUONEB) 0.5-2.5 (3) MG/3ML SOLN    Take 3 mLs by nebulization 3 (three) times daily as needed (wheezing).    LACTULOSE  (CHRONULAC) 10 GM/15ML SOLUTION    Take 30 g by mouth every morning.    LEVOTHYROXINE (SYNTHROID, LEVOTHROID) 100 MCG TABLET    Take 100 mcg by mouth daily. For hypothyroidism   LORAZEPAM (ATIVAN) 0.5 MG TABLET    Take 0.5 mg by mouth 2 (two) times daily as needed for anxiety.   MAGNESIUM HYDROXIDE (MILK OF MAGNESIA) 400 MG/5ML SUSPENSION    Take 30 mLs by mouth daily as needed for mild constipation.   MULTIPLE VITAMINS-MINERALS (MULTIVITAMIN WITH MINERALS) TABLET    Take 1 tablet by mouth daily.   POLYETHYLENE GLYCOL POWDER (MIRALAX) POWDER    Take 17 g by mouth daily. For constipation. Dispense 1 month supply.   POLYVINYL ALCOHOL-POVIDONE (HYPOTEARS) 1.4-0.6 % OPHTHALMIC SOLUTION    Place 1 drop into both eyes 3 (three) times daily. Reported on 06/12/2015   POTASSIUM CHLORIDE SA (K-DUR,KLOR-CON) 20 MEQ TABLET    Take 20 mEq by mouth 2 (two) times daily. For heart disease  Modified Medications   No medications on file  Discontinued Medications   No medications on file  SIGNIFICANT DIAGNOSTIC EXAMS  04-16-14: ct of head: 1. No acute intracranial findings. 2. Severe volume loss and chronic microvascular ischemic changes 3. Left maxillary sinus disease, likely chronic.  04-16-14: chest x-ray: No active cardiopulmonary disease.  04-17-14: doppler  bilateral lower extremities: - No evidence of deep vein thrombosis involving the visualized veins of the right lower extremity. - No evidence of deep vein thrombosis involving the visualized veins of the left lower extremity. - No evidence of Baker&'s cyst on the right or left.  05-30-14: left lower extremity doppler: no dvt    LABS REVIEWED:    07-04-14: wbc 5.5; hgb 12.1; hct 36.3; mcv 96.5; plt 150; glucose 101; bun 18; creat 0.94; k+ 3.8; na++138; liver normal albumin 3.1   08-23-14: urine culture: proteus mirabilis: rocephin  01-14-15: wbc 6.5; hgb 13.0; hct 38.6; mcv 97.7; plt 145; glcuose 61; bun 20; creat 1.00; k+ 4.1; na++144; liver normal  albumin 3.4; tsh 1.263  06-21-15: wbc 4.6; hgb 11.7; hct 34.3; mcv 96.1 plt 131; glucose 81; bun 19; creat 1.02; k+ 3.3; na++143; liver normal albumin 2.9    Review of Systems Unable to perform ROS: Dementia      Physical Exam Constitutional: No distress.  Eyes: Conjunctivae are normal.  Neck: Neck supple. No JVD present. No thyromegaly present.  Cardiovascular: Normal rate, regular rhythm and intact distal pulses.   Respiratory: Effort normal and breath sounds normal. No respiratory distress. She has no wheezes.  GI: Soft. Bowel sounds are normal. She exhibits no distension. There is no tenderness.  Musculoskeletal: She exhibits edema.  Able to move all extremities  1+ lower extremity edema   Lymphadenopathy:    She has no cervical adenopathy.  Neurological: She is alert.  Skin: Skin is warm and dry. She is not diaphoretic.  Psychiatric: She has a normal mood and affect.      ASSESSMENT/ PLAN:   1. Hypokalemia: will increase her k+ to 20 meq three times daily and will check bmp in one week.        Synthia Innocent NP [facility]  Contact [phone] Monday through Friday 8am- 5pm  After hours call [phone]

## 2015-07-01 ENCOUNTER — Non-Acute Institutional Stay (SKILLED_NURSING_FACILITY): Payer: Medicare Other | Admitting: Adult Health

## 2015-07-01 ENCOUNTER — Encounter: Payer: Self-pay | Admitting: Adult Health

## 2015-07-01 DIAGNOSIS — I10 Essential (primary) hypertension: Secondary | ICD-10-CM

## 2015-07-01 DIAGNOSIS — R634 Abnormal weight loss: Secondary | ICD-10-CM

## 2015-07-01 DIAGNOSIS — F329 Major depressive disorder, single episode, unspecified: Secondary | ICD-10-CM | POA: Diagnosis not present

## 2015-07-01 DIAGNOSIS — F0280 Dementia in other diseases classified elsewhere without behavioral disturbance: Secondary | ICD-10-CM | POA: Diagnosis not present

## 2015-07-01 DIAGNOSIS — E038 Other specified hypothyroidism: Secondary | ICD-10-CM | POA: Diagnosis not present

## 2015-07-01 DIAGNOSIS — E034 Atrophy of thyroid (acquired): Secondary | ICD-10-CM

## 2015-07-01 DIAGNOSIS — F0393 Unspecified dementia, unspecified severity, with mood disturbance: Secondary | ICD-10-CM

## 2015-07-01 DIAGNOSIS — F0151 Vascular dementia with behavioral disturbance: Secondary | ICD-10-CM

## 2015-07-01 DIAGNOSIS — F01518 Vascular dementia, unspecified severity, with other behavioral disturbance: Secondary | ICD-10-CM

## 2015-07-01 DIAGNOSIS — I509 Heart failure, unspecified: Secondary | ICD-10-CM

## 2015-07-01 DIAGNOSIS — R627 Adult failure to thrive: Secondary | ICD-10-CM

## 2015-07-01 NOTE — Progress Notes (Signed)
Patient ID: Melanie Roberson, female   DOB: 31-Jul-1923, 82 y.o.   MRN: [id number]   [facility]     CODE STATUS: Full Code  Allergies  Allergen Reactions  . Erythromycin     Per MAR  . Penicillins     Per MAR  . Sulfa Antibiotics     Per [facility]    Chief Complaint  Patient presents with  . Medical Management of Chronic Issues    Follow up    HPI:  She is a long term resident of this facility being seen for the management of her chronic illnesses. She is losing weight. She is presently on marinol for a total of 30 days to help stimulate her appetite. Those results are still pending. She is being seen by therapy for her left hand contracture. She is eating snuff rather than her meals. Her family is aware of this and would like the nursing staff to give her the snuff; and would like the snuff to be kept on the medicine cart. She would also like her hand to be x-rayed. We spoke regarding her weight loss; we have began the discussion regarding possible feeding tube in the future.    Past Medical History  Diagnosis Date  . MYALGIA 07/13/2007    Qualifier: Diagnosis of  By: Gavin Potters MD, HEIDI    . SCHIZOPHRENIA 04/29/2006    Qualifier: Diagnosis of  By: Erenest Rasher MD, MADELEINE    . TREMOR, ESSENTIAL 05/28/2006    Qualifier: Diagnosis of  By: Erenest Rasher MD, MADELEINE    . Insomnia 08/11/2013  . Anemia   . Thyroid disease   . Hyperlipidemia   . Hypertension   . Depression   . CHF (congestive heart failure) (HCC)     History reviewed. No pertinent past surgical history.  Social History   Social History  . Marital Status: Widowed    Spouse Name: N/A  . Number of Children: N/A  . Years of Education: N/A   Occupational History  . Not on file.   Social History Main Topics  . Smoking status: Former Games developer  . Smokeless tobacco: Current User    Types: Snuff  . Alcohol Use: No  . Drug Use: No  . Sexual Activity: No   Other Topics Concern  . Not on file   Social History  Narrative   Family History  Problem Relation Age of Onset  . Family history unknown: Yes      VITAL SIGNS BP 118/67 mmHg  Pulse 76  Temp(Src) 97.8 F (36.6 C) (Oral)  Resp 18  Ht  (1.676 m)  Wt 119 lb (53.978 kg)  BMI 19.22 kg/m2  SpO2 96%  Patient's Medications  New Prescriptions   No medications on file  Previous Medications   CYANOCOBALAMIN 500 MCG TABLET    Take 500 mcg by mouth daily. For B-12 deficiency   DRONABINOL (MARINOL) 2.5 MG CAPSULE    Take 1 capsule (2.5 mg total) by mouth daily before lunch.   DULOXETINE (CYMBALTA) 30 MG CAPSULE    Take 30 mg by mouth daily. For anxiety   FUROSEMIDE (LASIX) 40 MG TABLET    Take 40 mg by mouth 2 (two) times daily.   GUAIFENESIN (ROBITUSSIN) 100 MG/5ML LIQUID    Take 200 mg by mouth every 6 (six) hours as needed for cough. Reported on 06/12/2015   IPRATROPIUM-ALBUTEROL (DUONEB) 0.5-2.5 (3) MG/3ML SOLN    Take 3 mLs by nebulization 3 (three) times daily as needed (  wheezing).    LACTULOSE (CHRONULAC) 10 GM/15ML SOLUTION    Take 30 g by mouth every morning.    LEVOTHYROXINE (SYNTHROID, LEVOTHROID) 100 MCG TABLET    Take 100 mcg by mouth daily. For hypothyroidism   LORAZEPAM (ATIVAN) 0.5 MG TABLET    Take 0.5 mg by mouth 2 (two) times daily as needed for anxiety.   MAGNESIUM HYDROXIDE (MILK OF MAGNESIA) 400 MG/5ML SUSPENSION    Take 30 mLs by mouth daily as needed for mild constipation.   MULTIPLE VITAMINS-MINERALS (MULTIVITAMIN WITH MINERALS) TABLET    Take 1 tablet by mouth daily.   POLYETHYLENE GLYCOL POWDER (MIRALAX) POWDER    Take 17 g by mouth daily. For constipation. Dispense 1 month supply.   POLYVINYL ALCOHOL-POVIDONE (HYPOTEARS) 1.4-0.6 % OPHTHALMIC SOLUTION    Place 1 drop into both eyes 3 (three) times daily. Reported on 06/12/2015   POTASSIUM CHLORIDE SA (K-DUR,KLOR-CON) 20 MEQ TABLET    Take 1 tablet (20 mEq total) by mouth 3 (three) times daily. For heart disease  Modified Medications   No medications on file    Discontinued Medications   No medications on file     SIGNIFICANT DIAGNOSTIC EXAMS  04-16-14: ct of head: 1. No acute intracranial findings. 2. Severe volume loss and chronic microvascular ischemic changes 3. Left maxillary sinus disease, likely chronic.  04-16-14: chest x-ray: No active cardiopulmonary disease.  04-17-14: doppler  bilateral lower extremities: - No evidence of deep vein thrombosis involving the visualized veins of the right lower extremity. - No evidence of deep vein thrombosis involving the visualized veins of the left lower extremity. - No evidence of Baker&'s cyst on the right or left.  05-30-14: left lower extremity doppler: no dvt    LABS REVIEWED:    07-04-14: wbc 5.5; hgb 12.1; hct 36.3; mcv 96.5; plt 150; glucose 101; bun 18; creat 0.94; k+ 3.8; na++138; liver normal albumin 3.1   08-23-14: urine culture: proteus mirabilis: rocephin  01-14-15: wbc 6.5; hgb 13.0; hct 38.6; mcv 97.7; plt 145; glcuose 61; bun 20; creat 1.00; k+ 4.1; na++144; liver normal albumin 3.4; tsh 1.263  06-21-15: wbc 4.6; hgb 11.7; hct 34.3; mcv 96.1 plt 131; glucose 81; bun 19; creat 1.02; k+ 3.3; na++143; liver normal albumin 2.9    Review of Systems Unable to perform ROS: Dementia      Physical Exam Constitutional: No distress.  Eyes: Conjunctivae are normal.  Neck: Neck supple. No JVD present. No thyromegaly present.  Cardiovascular: Normal rate, regular rhythm and intact distal pulses.   Respiratory: Effort normal and breath sounds normal. No respiratory distress. She has no wheezes.  GI: Soft. Bowel sounds are normal. She exhibits no distension. There is no tenderness.  Musculoskeletal: She exhibits edema.  Able to move all extremities Has left hand contracture has splint   1+ lower extremity edema   Lymphadenopathy:    She has no cervical adenopathy.  Neurological: She is alert.  Skin: Skin is warm and dry. She is not diaphoretic.  Psychiatric: She has a normal mood and  affect.      ASSESSMENT/ PLAN:   1. Lower extremity edema: is stable will continue lasix 40 mg twice  daily with k+ 20 meq three times daily and will monitor her status   2. Hypothyroidism: will continue synthroid 100 mcg daily tsh is 1.263  3.  Hypertension: is presently not on medications; will not make changes will  Monitor  4. Constipation: will continue lactulose 30 cc daily and miralax  daily   5. Vascular dementia: no significant change in status; her current weight is 128 pounds; is presently not on medications; weight loss is an unfortunate outcome of end stage dementia.   6. Depression: does receive benefit from cymbalta 30 mg daily and has ativan 0.5 mg twice daily as needed for anxiety   7. CHF: is stable will continue lasix 40 mg twice daily with k+ 20 meq twice daily  8. Schizophrenia: is without change in her status. Is not on antipsychotic medications will monitor   9. Weight loss with FTT: her current weight is 119 pounds.  Will have her complete the marinol and will monitor Is on supplements per facility protocol.      Will get x-ray of left hand Will check cbc; cmp; tsh   Time spent with patient 50   minutes >50% time spent counseling; reviewing medical record; tests; labs; and developing future plan of care      Synthia Innocenteborah Green NP [facility]  Contact [phone] Monday through Friday 8am- 5pm  After hours call [phone]

## 2015-07-03 LAB — CBC AND DIFFERENTIAL
HCT: 38 % (ref 36–46)
Hemoglobin: 13 g/dL (ref 12.0–16.0)
Platelets: 131 10*3/uL — AB (ref 150–399)
WBC: 5.4 10*3/mL

## 2015-07-03 LAB — BASIC METABOLIC PANEL
BUN: 17 mg/dL (ref 4–21)
CREATININE: 1 mg/dL (ref 0.5–1.1)
GLUCOSE: 94 mg/dL
POTASSIUM: 3.6 mmol/L (ref 3.4–5.3)
Sodium: 143 mmol/L (ref 137–147)

## 2015-07-03 LAB — HEPATIC FUNCTION PANEL
ALT: 7 U/L (ref 7–35)
AST: 19 U/L (ref 13–35)
Alkaline Phosphatase: 81 U/L (ref 25–125)

## 2015-07-03 LAB — TSH: TSH: 9.1 u[IU]/mL — AB (ref 0.41–5.90)

## 2015-07-04 ENCOUNTER — Non-Acute Institutional Stay (SKILLED_NURSING_FACILITY): Payer: Medicare Other | Admitting: Adult Health

## 2015-07-04 ENCOUNTER — Encounter: Payer: Self-pay | Admitting: Adult Health

## 2015-07-04 DIAGNOSIS — E038 Other specified hypothyroidism: Secondary | ICD-10-CM | POA: Diagnosis not present

## 2015-07-04 DIAGNOSIS — E034 Atrophy of thyroid (acquired): Secondary | ICD-10-CM

## 2015-07-04 NOTE — Progress Notes (Signed)
Patient ID: Melanie Roberson, female   DOB: 07-28-1923, 82 y.o.   MRN: [id number]    [facility]     CODE STATUS: Full Code  Allergies  Allergen Reactions  . Erythromycin     Per MAR  . Penicillins     Per MAR  . Sulfa Antibiotics     Per [facility]   Chief Complaint  Patient presents with  . Acute Visit    follow up lab       HPI:   Her tsh is elevated at 20.45. There are no reports of significant changes in behaviors; no changes in appetite. She is unable to participate in the hpi or ros. There are no nursing concerns at this time.   Past Medical History  Diagnosis Date  . MYALGIA 07/13/2007    Qualifier: Diagnosis of  By: Gavin Potters MD, HEIDI    . SCHIZOPHRENIA 04/29/2006    Qualifier: Diagnosis of  By: Erenest Rasher MD, MADELEINE    . TREMOR, ESSENTIAL 05/28/2006    Qualifier: Diagnosis of  By: Erenest Rasher MD, MADELEINE    . Insomnia 08/11/2013  . Anemia   . Thyroid disease   . Hyperlipidemia   . Hypertension   . Depression   . CHF (congestive heart failure) (HCC)     History reviewed. No pertinent past surgical history.  Social History   Social History  . Marital Status: Widowed    Spouse Name: N/A  . Number of Children: N/A  . Years of Education: N/A   Occupational History  . Not on file.   Social History Main Topics  . Smoking status: Former Games developer  . Smokeless tobacco: Current User    Types: Snuff  . Alcohol Use: No  . Drug Use: No  . Sexual Activity: No   Other Topics Concern  . Not on file   Social History Narrative   Family History  Problem Relation Age of Onset  . Family history unknown: Yes      VITAL SIGNS BP 123/68 mmHg  Pulse 74  Temp(Src) 97.8 F (36.6 C) (Oral)  Resp 18  Wt 117 lb (53.071 kg)  SpO2 95%  Patient's Medications  New Prescriptions   No medications on file  Previous Medications   CYANOCOBALAMIN 500 MCG TABLET    Take 500 mcg by mouth daily. For B-12 deficiency   DRONABINOL (MARINOL) 2.5 MG CAPSULE    Take 1  capsule (2.5 mg total) by mouth daily before lunch.   DULOXETINE (CYMBALTA) 30 MG CAPSULE    Take 30 mg by mouth daily. For anxiety   FUROSEMIDE (LASIX) 40 MG TABLET    Take 40 mg by mouth 2 (two) times daily.   GUAIFENESIN (ROBITUSSIN) 100 MG/5ML LIQUID    Take 200 mg by mouth every 6 (six) hours as needed for cough. Reported on 06/12/2015   IPRATROPIUM-ALBUTEROL (DUONEB) 0.5-2.5 (3) MG/3ML SOLN    Take 3 mLs by nebulization 3 (three) times daily as needed (wheezing).    LACTULOSE (CHRONULAC) 10 GM/15ML SOLUTION    Take 30 g by mouth every morning.    LEVOTHYROXINE (SYNTHROID, LEVOTHROID) 100 MCG TABLET    Take 100 mcg by mouth daily. For hypothyroidism   MAGNESIUM HYDROXIDE (MILK OF MAGNESIA) 400 MG/5ML SUSPENSION    Take 30 mLs by mouth daily as needed for mild constipation.   MULTIPLE VITAMINS-MINERALS (MULTIVITAMIN WITH MINERALS) TABLET    Take 1 tablet by mouth daily.   POLYETHYLENE GLYCOL POWDER (MIRALAX) POWDER  Take 17 g by mouth daily. For constipation. Dispense 1 month supply.   POLYVINYL ALCOHOL-POVIDONE (HYPOTEARS) 1.4-0.6 % OPHTHALMIC SOLUTION    Place 1 drop into both eyes 3 (three) times daily. Reported on 06/12/2015   POTASSIUM CHLORIDE SA (K-DUR,KLOR-CON) 20 MEQ TABLET    Take 1 tablet (20 mEq total) by mouth 3 (three) times daily. For heart disease  Modified Medications   No medications on file  Discontinued Medications   LORAZEPAM (ATIVAN) 0.5 MG TABLET    Take 0.5 mg by mouth 2 (two) times daily as needed for anxiety. Reported on 07/04/2015     SIGNIFICANT DIAGNOSTIC EXAMS  04-16-14: ct of head: 1. No acute intracranial findings. 2. Severe volume loss and chronic microvascular ischemic changes 3. Left maxillary sinus disease, likely chronic.  04-16-14: chest x-ray: No active cardiopulmonary disease.  04-17-14: doppler  bilateral lower extremities: - No evidence of deep vein thrombosis involving the visualized veins of the right lower extremity. - No evidence of deep vein  thrombosis involving the visualized veins of the left lower extremity. - No evidence of Baker&'s cyst on the right or left.  05-30-14: left lower extremity doppler: no dvt    LABS REVIEWED:    07-04-14: wbc 5.5; hgb 12.1; hct 36.3; mcv 96.5; plt 150; glucose 101; bun 18; creat 0.94; k+ 3.8; na++138; liver normal albumin 3.1   08-23-14: urine culture: proteus mirabilis: rocephin  01-14-15: wbc 6.5; hgb 13.0; hct 38.6; mcv 97.7; plt 145; glcuose 61; bun 20; creat 1.00; k+ 4.1; na++144; liver normal albumin 3.4; tsh 1.263  06-21-15: wbc 4.6; hgb 11.7; hct 34.3; mcv 96.1 plt 131; glucose 81; bun 19; creat 1.02; k+ 3.3; na++143; liver normal albumin 2.9 07-03-15: wbc 5.4; hgb 13.0; hct 38.0; mcv 96.0; plt 131; glucose 94; bun 17; creat 0.98; k+ 3.6; na++143; liver normal albumin 3.1; tsh 20.45     Review of Systems Unable to perform ROS: Dementia      Physical Exam Constitutional: No distress.  Eyes: Conjunctivae are normal.  Neck: Neck supple. No JVD present. No thyromegaly present.  Cardiovascular: Normal rate, regular rhythm and intact distal pulses.   Respiratory: Effort normal and breath sounds normal. No respiratory distress. She has no wheezes.  GI: Soft. Bowel sounds are normal. She exhibits no distension. There is no tenderness.  Musculoskeletal: She exhibits edema.  Able to move all extremities Has left hand contracture has splint   1+ lower extremity edema   Lymphadenopathy:    She has no cervical adenopathy.  Neurological: She is alert.  Skin: Skin is warm and dry. She is not diaphoretic.  Psychiatric: She has a normal mood and affect.      ASSESSMENT/ PLAN:   1. Hypothyroidism: will increase synthroid to 125 mcg daily tsh is 20.45; in 4 weeks will check tsh free T4; free T3 in four weeks.    Synthia Innocenteborah Tamrah Victorino NP [facility]  Contact [phone] Monday through Friday 8am- 5pm  After hours call [phone]

## 2015-07-15 ENCOUNTER — Other Ambulatory Visit: Payer: Self-pay | Admitting: *Deleted

## 2015-07-15 DIAGNOSIS — F01518 Vascular dementia, unspecified severity, with other behavioral disturbance: Secondary | ICD-10-CM

## 2015-07-15 DIAGNOSIS — R634 Abnormal weight loss: Secondary | ICD-10-CM

## 2015-07-15 DIAGNOSIS — R627 Adult failure to thrive: Secondary | ICD-10-CM

## 2015-07-15 DIAGNOSIS — F0151 Vascular dementia with behavioral disturbance: Secondary | ICD-10-CM

## 2015-07-15 MED ORDER — DRONABINOL 2.5 MG PO CAPS: ORAL_CAPSULE | ORAL | Status: DC

## 2015-07-15 NOTE — Telephone Encounter (Signed)
[facility] [place] 

## 2015-07-16 LAB — HM DIABETES FOOT EXAM

## 2015-07-26 LAB — TSH: TSH: 9.86 u[IU]/mL — AB (ref 0.41–5.90)

## 2015-07-31 ENCOUNTER — Encounter: Payer: Self-pay | Admitting: Adult Health

## 2015-07-31 ENCOUNTER — Non-Acute Institutional Stay (SKILLED_NURSING_FACILITY): Payer: Medicare Other | Admitting: Adult Health

## 2015-07-31 DIAGNOSIS — E034 Atrophy of thyroid (acquired): Secondary | ICD-10-CM

## 2015-07-31 DIAGNOSIS — E038 Other specified hypothyroidism: Secondary | ICD-10-CM | POA: Diagnosis not present

## 2015-07-31 MED ORDER — LEVOTHYROXINE SODIUM 137 MCG PO TABS: 137.0000 ug | ORAL_TABLET | Freq: Every day | ORAL | Status: DC

## 2015-07-31 NOTE — Progress Notes (Signed)
Patient ID: KA BENCH, female   DOB: 19-Jul-1923, 82 y.o.   MRN: [id number]   Location:  [facility] Room Number: 135-A Place of Service:  SNF (31)   CODE STATUS: Full Code  Allergies  Allergen Reactions  . Erythromycin     Per MAR  . Penicillins     Per MAR  . Sulfa Antibiotics     Per [facility]    Chief Complaint  Patient presents with  . Acute Visit    Thyroid    HPI:  Her tsh remains elevated at 9.86; which is improving from 20.45. She is unable to participate in the hpi or ros. Her thyroid gland is somewhat hard and grainy to palpation. There are no nursing concerns at this time.   Past Medical History  Diagnosis Date  . MYALGIA 07/13/2007    Qualifier: Diagnosis of  By: Gavin Potters MD, HEIDI    . SCHIZOPHRENIA 04/29/2006    Qualifier: Diagnosis of  By: Erenest Rasher MD, MADELEINE    . TREMOR, ESSENTIAL 05/28/2006    Qualifier: Diagnosis of  By: Erenest Rasher MD, MADELEINE    . Insomnia 08/11/2013  . Anemia   . Thyroid disease   . Hyperlipidemia   . Hypertension   . Depression   . CHF (congestive heart failure) (HCC)     History reviewed. No pertinent past surgical history.  Social History   Social History  . Marital Status: Widowed    Spouse Name: N/A  . Number of Children: N/A  . Years of Education: N/A   Occupational History  . Not on file.   Social History Main Topics  . Smoking status: Former Games developer  . Smokeless tobacco: Current User    Types: Snuff  . Alcohol Use: No  . Drug Use: No  . Sexual Activity: No   Other Topics Concern  . Not on file   Social History Narrative   Family History  Problem Relation Age of Onset  . Family history unknown: Yes      VITAL SIGNS BP 136/76 mmHg  Pulse 76  Temp(Src) 97.9 F (36.6 C) (Oral)  Resp 18  Ht  (1.6 m)  Wt 138 lb (62.596 kg)  BMI 24.45 kg/m2  SpO2 98%  Patient's Medications  New Prescriptions   No medications on file  Previous Medications   CYANOCOBALAMIN 500 MCG TABLET    Take 500 mcg by mouth daily. For B-12 deficiency   DRONABINOL (MARINOL) 2.5 MG CAPSULE    Take one capsule by mouth once daily before lunch for 30 days   DULOXETINE (CYMBALTA) 30 MG CAPSULE    Take 30 mg by mouth daily. For anxiety   FUROSEMIDE (LASIX) 40 MG TABLET    Take 40 mg by mouth 2 (two) times daily.   GUAIFENESIN (ROBITUSSIN) 100 MG/5ML LIQUID    Take 200 mg by mouth every 6 (six) hours as needed for cough. Reported on 06/12/2015   IPRATROPIUM-ALBUTEROL (DUONEB) 0.5-2.5 (3) MG/3ML SOLN    Take 3 mLs by nebulization 3 (three) times daily as needed (wheezing).    LACTULOSE (CHRONULAC) 10 GM/15ML SOLUTION    Take 30 g by mouth every morning.    Synthroid 125 mcg daily  Take daily    MAGNESIUM HYDROXIDE (MILK OF MAGNESIA) 400 MG/5ML SUSPENSION    Take 30 mLs by mouth daily as needed for mild constipation.   MULTIPLE VITAMINS-MINERALS (MULTIVITAMIN WITH MINERALS) TABLET    Take 1 tablet by mouth daily.  POLYETHYLENE GLYCOL POWDER (MIRALAX) POWDER    Take 17 g by mouth daily. For constipation. Dispense 1 month supply.   POLYVINYL ALCOHOL-POVIDONE (HYPOTEARS) 1.4-0.6 % OPHTHALMIC SOLUTION    Place 1 drop into both eyes 3 (three) times daily. Reported on 06/12/2015   POTASSIUM CHLORIDE SA (K-DUR,KLOR-CON) 20 MEQ TABLET    Take 1 tablet (20 mEq total) by mouth 3 (three) times daily. For heart disease  Modified Medications   No medications on file  Discontinued Medications   LEVOTHYROXINE (SYNTHROID, LEVOTHROID) 100 MCG TABLET    Take 100 mcg by mouth daily. For hypothyroidism     SIGNIFICANT DIAGNOSTIC EXAMS  04-16-14: ct of head: 1. No acute intracranial findings. 2. Severe volume loss and chronic microvascular ischemic changes 3. Left maxillary sinus disease, likely chronic.  04-16-14: chest x-ray: No active cardiopulmonary disease.  04-17-14: doppler  bilateral lower extremities: - No evidence of deep vein thrombosis involving the visualized veins of the  right lower extremity. - No evidence of deep vein thrombosis involving the visualized veins of the left lower extremity. - No evidence of Baker&'s cyst on the right or left.  05-30-14: left lower extremity doppler: no dvt    LABS REVIEWED:    07-04-14: wbc 5.5; hgb 12.1; hct 36.3; mcv 96.5; plt 150; glucose 101; bun 18; creat 0.94; k+ 3.8; na++138; liver normal albumin 3.1   08-23-14: urine culture: proteus mirabilis: rocephin  01-14-15: wbc 6.5; hgb 13.0; hct 38.6; mcv 97.7; plt 145; glcuose 61; bun 20; creat 1.00; k+ 4.1; na++144; liver normal albumin 3.4; tsh 1.263  06-21-15: wbc 4.6; hgb 11.7; hct 34.3; mcv 96.1 plt 131; glucose 81; bun 19; creat 1.02; k+ 3.3; na++143; liver normal albumin 2.9 07-03-15: wbc 5.4; hgb 13.0; hct 38.0; mcv 96.0; plt 131; glucose 94; bun 17; creat 0.98; k+ 3.6; na++143; liver normal albumin 3.1; tsh 20.45  07-26-15: tsh 9.86; free T4; 0.9; free T3: 1.8     Review of Systems Unable to perform ROS: Dementia      Physical Exam Constitutional: No distress.  Eyes: Conjunctivae are normal.  Neck: Neck supple. No JVD present. No thyromegaly present thyroid has grainy and "hard"   Cardiovascular: Normal rate, regular rhythm and intact distal pulses.   Respiratory: Effort normal and breath sounds normal. No respiratory distress. She has no wheezes.  GI: Soft. Bowel sounds are normal. She exhibits no distension. There is no tenderness.  Musculoskeletal: She exhibits edema.  Able to move all extremities Has left hand contracture has splint   1+ lower extremity edema   Lymphadenopathy:    She has no cervical adenopathy.  Neurological: She is alert.  Skin: Skin is warm and dry. She is not diaphoretic.  Psychiatric: She has a normal mood and affect.      ASSESSMENT/ PLAN:   1. Hypothyroidism: will increase synthroid to 137 mcg daily tsh is 9.86; free T4; 0.9; free T3: 1.8; will check thyroid ultrasound; will repeat thyroid labs in 4 weeks.    Synthia Innocenteborah Green  NP [facility]  Contact [phone] Monday through Friday 8am- 5pm  After hours call [phone]

## 2015-08-05 ENCOUNTER — Encounter: Payer: Self-pay | Admitting: Adult Health

## 2015-08-05 ENCOUNTER — Non-Acute Institutional Stay (SKILLED_NURSING_FACILITY): Payer: Medicare Other | Admitting: Adult Health

## 2015-08-05 DIAGNOSIS — R627 Adult failure to thrive: Secondary | ICD-10-CM

## 2015-08-05 DIAGNOSIS — F0151 Vascular dementia with behavioral disturbance: Secondary | ICD-10-CM

## 2015-08-05 DIAGNOSIS — R600 Localized edema: Secondary | ICD-10-CM

## 2015-08-05 DIAGNOSIS — E038 Other specified hypothyroidism: Secondary | ICD-10-CM | POA: Diagnosis not present

## 2015-08-05 DIAGNOSIS — F329 Major depressive disorder, single episode, unspecified: Secondary | ICD-10-CM

## 2015-08-05 DIAGNOSIS — F0393 Unspecified dementia, unspecified severity, with mood disturbance: Secondary | ICD-10-CM

## 2015-08-05 DIAGNOSIS — E034 Atrophy of thyroid (acquired): Secondary | ICD-10-CM | POA: Diagnosis not present

## 2015-08-05 DIAGNOSIS — R634 Abnormal weight loss: Secondary | ICD-10-CM

## 2015-08-05 DIAGNOSIS — F01518 Vascular dementia, unspecified severity, with other behavioral disturbance: Secondary | ICD-10-CM

## 2015-08-05 DIAGNOSIS — F0280 Dementia in other diseases classified elsewhere without behavioral disturbance: Secondary | ICD-10-CM

## 2015-08-05 DIAGNOSIS — F209 Schizophrenia, unspecified: Secondary | ICD-10-CM | POA: Diagnosis not present

## 2015-08-05 DIAGNOSIS — I10 Essential (primary) hypertension: Secondary | ICD-10-CM | POA: Diagnosis not present

## 2015-08-05 NOTE — Progress Notes (Signed)
Patient ID: Melanie Roberson, female   DOB: 05-17-1923, 82 y.o.   MRN: [id number]   Location:  [facility] Room Number: 135-A Place of Service:  SNF (31)   CODE STATUS: Full Code  Allergies  Allergen Reactions  . Erythromycin     Per MAR  . Penicillins     Per MAR  . Sulfa Antibiotics     Per [facility]    Chief Complaint  Patient presents with  . Medical Management of Chronic Issues    Follow up    HPI:  She is a long term resident of this facility being seen for the management of her chronic illnesses. Overall there is little change in her status. She cannot fully participate in the hpi or ros. The nursing staff is distributing her snuff at this time. There are no nursing concerns at this time.    Past Medical History  Diagnosis Date  . MYALGIA 07/13/2007    Qualifier: Diagnosis of  By: Gavin Potters MD, HEIDI    . SCHIZOPHRENIA 04/29/2006    Qualifier: Diagnosis of  By: Erenest Rasher MD, MADELEINE    . TREMOR, ESSENTIAL 05/28/2006    Qualifier: Diagnosis of  By: Erenest Rasher MD, MADELEINE    . Insomnia 08/11/2013  . Anemia   . Thyroid disease   . Hyperlipidemia   . Hypertension   . Depression   . CHF (congestive heart failure) (HCC)     History reviewed. No pertinent past surgical history.  Social History   Social History  . Marital Status: Widowed    Spouse Name: N/A  . Number of Children: N/A  . Years of Education: N/A   Occupational History  . Not on file.   Social History Main Topics  . Smoking status: Former Games developer  . Smokeless tobacco: Current User    Types: Snuff  . Alcohol Use: No  . Drug Use: No  . Sexual Activity: No   Other Topics Concern  . Not on file   Social History Narrative   Family History  Problem Relation Age of Onset  . Family history unknown: Yes      VITAL SIGNS BP 136/76 mmHg  Pulse 76  Temp(Src) 97.9 F (36.6 C) (Oral)  Resp 18  Ht  (1.6 m)  Wt 139 lb (63.05 kg)  BMI 24.63 kg/m2  SpO2  98%  Patient's Medications  New Prescriptions   No medications on file  Previous Medications   CYANOCOBALAMIN 500 MCG TABLET    Take 500 mcg by mouth daily. For B-12 deficiency   DRONABINOL (MARINOL) 2.5 MG CAPSULE    Take one capsule by mouth once daily before lunch for 30 days   DULOXETINE (CYMBALTA) 30 MG CAPSULE    Take 30 mg by mouth daily. For anxiety   FUROSEMIDE (LASIX) 40 MG TABLET    Take 40 mg by mouth 2 (two) times daily.   GUAIFENESIN (ROBITUSSIN) 100 MG/5ML LIQUID    Take 200 mg by mouth every 6 (six) hours as needed for cough. Reported on 06/12/2015   IPRATROPIUM-ALBUTEROL (DUONEB) 0.5-2.5 (3) MG/3ML SOLN    Take 3 mLs by nebulization 3 (three) times daily as needed (wheezing).    LACTULOSE (CHRONULAC) 10 GM/15ML SOLUTION    Take 30 g by mouth every morning.    LEVOTHYROXINE (SYNTHROID) 137 MCG TABLET    Take 1 tablet (137 mcg total) by mouth daily before breakfast.   MAGNESIUM HYDROXIDE (MILK OF MAGNESIA) 400 MG/5ML SUSPENSION  Take 30 mLs by mouth daily as needed for mild constipation.   MULTIPLE VITAMINS-MINERALS (MULTIVITAMIN WITH MINERALS) TABLET    Take 1 tablet by mouth daily.   POLYETHYLENE GLYCOL POWDER (MIRALAX) POWDER    Take 17 g by mouth daily. For constipation. Dispense 1 month supply.   POLYVINYL ALCOHOL-POVIDONE (HYPOTEARS) 1.4-0.6 % OPHTHALMIC SOLUTION    Place 1 drop into both eyes 3 (three) times daily. Reported on 06/12/2015   POTASSIUM CHLORIDE SA (K-DUR,KLOR-CON) 20 MEQ TABLET    Take 1 tablet (20 mEq total) by mouth 3 (three) times daily. For heart disease  Modified Medications   No medications on file  Discontinued Medications   No medications on file     SIGNIFICANT DIAGNOSTIC EXAMS  04-16-14: ct of head: 1. No acute intracranial findings. 2. Severe volume loss and chronic microvascular ischemic changes 3. Left maxillary sinus disease, likely chronic.  04-16-14: chest x-ray: No active cardiopulmonary disease.  04-17-14: doppler  bilateral lower  extremities: - No evidence of deep vein thrombosis involving the visualized veins of the right lower extremity. - No evidence of deep vein thrombosis involving the visualized veins of the left lower extremity. - No evidence of Baker&'s cyst on the right or left.  05-30-14: left lower extremity doppler: no dvt    LABS REVIEWED:    07-04-14: wbc 5.5; hgb 12.1; hct 36.3; mcv 96.5; plt 150; glucose 101; bun 18; creat 0.94; k+ 3.8; na++138; liver normal albumin 3.1   08-23-14: urine culture: proteus mirabilis: rocephin  01-14-15: wbc 6.5; hgb 13.0; hct 38.6; mcv 97.7; plt 145; glcuose 61; bun 20; creat 1.00; k+ 4.1; na++144; liver normal albumin 3.4; tsh 1.263  06-21-15: wbc 4.6; hgb 11.7; hct 34.3; mcv 96.1 plt 131; glucose 81; bun 19; creat 1.02; k+ 3.3; na++143; liver normal albumin 2.9 07-03-15: wbc 5.4; hgb 13.0; hct 38.0; mcv 96.0; plt 131; glucose 94; bun 17; creat 0.98; k+ 3.6; na++143; liver normal albumin 3.1; tsh 20.45  07-26-15: tsh 9.86; free T4; 0.9; free T3: 1.8     Review of Systems Unable to perform ROS: Dementia      Physical Exam Constitutional: No distress.  Eyes: Conjunctivae are normal.  Neck: Neck supple. No JVD present. No thyromegaly present thyroid has grainy and "hard"   Cardiovascular: Normal rate, regular rhythm and intact distal pulses.   Respiratory: Effort normal and breath sounds normal. No respiratory distress. She has no wheezes.  GI: Soft. Bowel sounds are normal. She exhibits no distension. There is no tenderness.  Musculoskeletal: She exhibits edema.  Able to move all extremities Has left hand contracture has splint   1+ lower extremity edema   Lymphadenopathy:    She has no cervical adenopathy.  Neurological: She is alert.  Skin: Skin is warm and dry. She is not diaphoretic.  Psychiatric: She has a normal mood and affect.      ASSESSMENT/ PLAN:    1. Lower extremity edema: is stable will continue lasix 40 mg twice  daily with k+ 20 meq three  times daily and will monitor her status   2. Hypothyroidism: will continue synthroid 137 mcg daily tsh is 9.86; free T40.9; free T3 1.8; her thyroid labs are pending.  3.  Hypertension: is presently not on medications; will not make changes will  Monitor  4. Constipation: will continue lactulose 30 cc daily and miralax daily   5. Vascular dementia: no significant change in status; her current weight is 139 pounds is presently stable. ; is  presently not on medications;    6. Depression: does receive benefit from cymbalta 30 mg daily and has ativan 0.5 mg twice daily as needed for anxiety   7. CHF: is stable will continue lasix 40 mg twice daily with k+ 20 meq twice daily  8. Schizophrenia: is without change in her status. Is not on antipsychotic medications will monitor   9. Weight loss with FTT: her current weight is 139 pounds; she is eating better with the staff giving her the snuff.          Synthia Innocenteborah Roger Kettles NP [facility]  Contact [phone] Monday through Friday 8am- 5pm  After hours call [phone]

## 2015-08-28 LAB — TSH: TSH: 7.24 u[IU]/mL — AB (ref <=5.90)

## 2015-09-09 ENCOUNTER — Non-Acute Institutional Stay (SKILLED_NURSING_FACILITY): Payer: Medicare Other | Admitting: Adult Health

## 2015-09-09 ENCOUNTER — Encounter: Payer: Self-pay | Admitting: Adult Health

## 2015-09-09 DIAGNOSIS — F209 Schizophrenia, unspecified: Secondary | ICD-10-CM | POA: Diagnosis not present

## 2015-09-09 DIAGNOSIS — E038 Other specified hypothyroidism: Secondary | ICD-10-CM | POA: Diagnosis not present

## 2015-09-09 DIAGNOSIS — F0280 Dementia in other diseases classified elsewhere without behavioral disturbance: Secondary | ICD-10-CM | POA: Diagnosis not present

## 2015-09-09 DIAGNOSIS — R600 Localized edema: Secondary | ICD-10-CM | POA: Diagnosis not present

## 2015-09-09 DIAGNOSIS — I10 Essential (primary) hypertension: Secondary | ICD-10-CM | POA: Diagnosis not present

## 2015-09-09 DIAGNOSIS — E876 Hypokalemia: Secondary | ICD-10-CM | POA: Diagnosis not present

## 2015-09-09 DIAGNOSIS — F0151 Vascular dementia with behavioral disturbance: Secondary | ICD-10-CM

## 2015-09-09 DIAGNOSIS — F01518 Vascular dementia, unspecified severity, with other behavioral disturbance: Secondary | ICD-10-CM

## 2015-09-09 DIAGNOSIS — E034 Atrophy of thyroid (acquired): Secondary | ICD-10-CM

## 2015-09-09 DIAGNOSIS — F329 Major depressive disorder, single episode, unspecified: Secondary | ICD-10-CM | POA: Diagnosis not present

## 2015-09-09 DIAGNOSIS — R627 Adult failure to thrive: Secondary | ICD-10-CM

## 2015-09-09 DIAGNOSIS — F0393 Unspecified dementia, unspecified severity, with mood disturbance: Secondary | ICD-10-CM

## 2015-09-09 MED ORDER — LEVOTHYROXINE SODIUM 150 MCG PO TABS: 150.0000 ug | ORAL_TABLET | Freq: Every day | ORAL | Status: DC

## 2015-09-09 NOTE — Progress Notes (Signed)
Patient ID: Melanie Roberson, female   DOB: [birth year], 82 y.o.   MRN: [id number]    Location:   [facility] Room Number: 135-A Place of Service:  SNF (31)   CODE STATUS: Full Code  Allergies  Allergen Reactions  . Erythromycin     Per MAR  . Penicillins     Per MAR  . Sulfa Antibiotics     Per [facility]    Chief Complaint  Patient presents with  . Medical Management of Chronic Issues    Follow up    HPI:  She is a long term resident of this facility being seen for the management of her chronic illnesses. Overall her status is stable. She has completed her marinol therapy; her weight is stable. She is unable to participate in the hpi or ros. There are no nursing concerns at this time.    Past Medical History  Diagnosis Date  . MYALGIA 07/13/2007    Qualifier: Diagnosis of  By: Gavin Potters MD, HEIDI    . SCHIZOPHRENIA 04/29/2006    Qualifier: Diagnosis of  By: Erenest Rasher MD, MADELEINE    . TREMOR, ESSENTIAL 05/28/2006    Qualifier: Diagnosis of  By: Erenest Rasher MD, MADELEINE    . Insomnia 08/11/2013  . Anemia   . Thyroid disease   . Hyperlipidemia   . Hypertension   . Depression   . CHF (congestive heart failure) (HCC)     History reviewed. No pertinent past surgical history.  Social History   Social History  . Marital Status: Widowed    Spouse Name: N/A  . Number of Children: N/A  . Years of Education: N/A   Occupational History  . Not on file.   Social History Main Topics  . Smoking status: Former Games developer  . Smokeless tobacco: Current User    Types: Snuff  . Alcohol Use: No  . Drug Use: No  . Sexual Activity: No   Other Topics Concern  . Not on file   Social History Narrative   Family History  Problem Relation Age of Onset  . Family history unknown: Yes      VITAL SIGNS BP 124/68 mmHg  Pulse 70  Temp(Src) 97.5 F (36.4 C) (Oral)  Resp 16  Ht 5\' 3"  (1.6 m)  Wt 139 lb (63.05 kg)  BMI 24.63 kg/m2  SpO2 96%  Patient's Medications  New  Prescriptions   No medications on file  Previous Medications   CYANOCOBALAMIN 500 MCG TABLET    Take 500 mcg by mouth daily. For B-12 deficiency   DULOXETINE (CYMBALTA) 30 MG CAPSULE    Take 30 mg by mouth daily. For anxiety   FUROSEMIDE (LASIX) 40 MG TABLET    Take 40 mg by mouth 2 (two) times daily.   GUAIFENESIN (ROBITUSSIN) 100 MG/5ML LIQUID    Take 200 mg by mouth every 6 (six) hours as needed for cough. Reported on 06/12/2015   IPRATROPIUM-ALBUTEROL (DUONEB) 0.5-2.5 (3) MG/3ML SOLN    Take 3 mLs by nebulization 3 (three) times daily as needed (wheezing).    LACTULOSE (CHRONULAC) 10 GM/15ML SOLUTION    Take 30 g by mouth every morning.    LEVOTHYROXINE (SYNTHROID) 137 MCG TABLET    Take 1 tablet (137 mcg total) by mouth daily before breakfast.   MAGNESIUM HYDROXIDE (MILK OF MAGNESIA) 400 MG/5ML SUSPENSION    Take 30 mLs by mouth daily as needed for mild constipation.   MULTIPLE VITAMINS-MINERALS (MULTIVITAMIN WITH MINERALS) TABLET  Take 1 tablet by mouth daily.   POLYETHYLENE GLYCOL POWDER (MIRALAX) POWDER    Take 17 g by mouth daily. For constipation. Dispense 1 month supply.   POLYVINYL ALCOHOL-POVIDONE (HYPOTEARS) 1.4-0.6 % OPHTHALMIC SOLUTION    Place 1 drop into both eyes 3 (three) times daily. Reported on 09/09/2015   POTASSIUM CHLORIDE SA (K-DUR,KLOR-CON) 20 MEQ TABLET    Take 1 tablet (20 mEq total) by mouth 3 (three) times daily. For heart disease  Modified Medications   No medications on file  Discontinued Medications   No medications on file     SIGNIFICANT DIAGNOSTIC EXAMS  04-16-14: ct of head: 1. No acute intracranial findings. 2. Severe volume loss and chronic microvascular ischemic changes 3. Left maxillary sinus disease, likely chronic.  04-16-14: chest x-ray: No active cardiopulmonary disease.  04-17-14: doppler  bilateral lower extremities: - No evidence of deep vein thrombosis involving the visualized veins of the right lower extremity. - No evidence of deep vein  thrombosis involving the visualized veins of the left lower extremity. - No evidence of Baker&'s cyst on the right or left.  05-30-14: left lower extremity doppler: no dvt    LABS REVIEWED:    01-14-15: wbc 6.5; hgb 13.0; hct 38.6; mcv 97.7; plt 145; glcuose 61; bun 20; creat 1.00; k+ 4.1; na++144; liver normal albumin 3.4; tsh 1.263  06-21-15: wbc 4.6; hgb 11.7; hct 34.3; mcv 96.1 plt 131; glucose 81; bun 19; creat 1.02; k+ 3.3; na++143; liver normal albumin 2.9 07-03-15: wbc 5.4; hgb 13.0; hct 38.0; mcv 96.0; plt 131; glucose 94; bun 17; creat 0.98; k+ 3.6; na++143; liver normal albumin 3.1; tsh 20.45  07-26-15: tsh 9.86; free T4; 0.9; free T3: 1.8  08-28-15: tsh 7.24; free T4 1.1; free T3: 1.8   Review of Systems Unable to perform ROS: Dementia      Physical Exam Constitutional: No distress.  Eyes: Conjunctivae are normal.  Neck: Neck supple. No JVD present. No thyromegaly present thyroid has grainy and "hard"   Cardiovascular: Normal rate, regular rhythm and intact distal pulses.   Respiratory: Effort normal and breath sounds normal. No respiratory distress. She has no wheezes.  GI: Soft. Bowel sounds are normal. She exhibits no distension. There is no tenderness.  Musculoskeletal: She exhibits edema.  Able to move all extremities Has left hand contracture has splint   1+ lower extremity edema   Lymphadenopathy:    She has no cervical adenopathy.  Neurological: She is alert.  Skin: Skin is warm and dry. She is not diaphoretic.  Psychiatric: She has a normal mood and affect.      ASSESSMENT/ PLAN:    1. Lower extremity edema: is stable will continue lasix 40 mg twice  daily with k+ 20 meq three times daily and will monitor her status   2. Hypothyroidism: will increase synthroid to 150 mcg daily tsh is 7.24; free T3 1.8; free T4 1.1; will repeat labs in 6 weeks.   3.  Hypertension: is presently not on medications; will not make changes will  Monitor  4. Constipation:  will continue lactulose 30 cc daily and miralax daily   5. Vascular dementia: no significant change in status; her current weight is 139 pounds is presently stable. ; is presently not on medications;  Will not make changes and will monitor   6. Depression: does receive benefit from cymbalta 30 mg daily   7. CHF: is stable will continue lasix 40 mg twice daily with k+ 20 meq twice daily  8. Schizophrenia: is without change in her status. Is not on antipsychotic medications will monitor   9. Weight loss with FTT: her current weight is 139 pounds; is currently stable;  she is eating better with the staff giving her the snuff.  She has completed her marinol therapy on 08-17-15     Synthia Innocent NP [facility] Adult Medicine  Contact [phone] Monday through Friday 8am- 5pm  After hours call [phone]

## 2015-10-10 ENCOUNTER — Encounter: Payer: Self-pay | Admitting: Adult Health

## 2015-10-10 ENCOUNTER — Non-Acute Institutional Stay (SKILLED_NURSING_FACILITY): Payer: Medicare Other | Admitting: Adult Health

## 2015-10-10 DIAGNOSIS — E038 Other specified hypothyroidism: Secondary | ICD-10-CM

## 2015-10-10 DIAGNOSIS — R627 Adult failure to thrive: Secondary | ICD-10-CM | POA: Diagnosis not present

## 2015-10-10 DIAGNOSIS — F0151 Vascular dementia with behavioral disturbance: Secondary | ICD-10-CM

## 2015-10-10 DIAGNOSIS — I509 Heart failure, unspecified: Secondary | ICD-10-CM

## 2015-10-10 DIAGNOSIS — E034 Atrophy of thyroid (acquired): Secondary | ICD-10-CM | POA: Diagnosis not present

## 2015-10-10 DIAGNOSIS — I10 Essential (primary) hypertension: Secondary | ICD-10-CM

## 2015-10-10 DIAGNOSIS — R600 Localized edema: Secondary | ICD-10-CM

## 2015-10-10 DIAGNOSIS — F01518 Vascular dementia, unspecified severity, with other behavioral disturbance: Secondary | ICD-10-CM

## 2015-10-10 LAB — TSH+T4F+T3FREE
Free T-3: 1.8
T4,Free (Direct): 1.1
TSH: 7.24

## 2015-10-10 NOTE — Progress Notes (Signed)
Patient ID: Melanie Roberson, female   DOB: Jul 24, 1923, 82 y.o.   MRN: [id number]   [facility] Room Number: 135-A Place of Service:  SNF (31)   CODE STATUS: Full Code  Allergies  Allergen Reactions  . Erythromycin     Per MAR  . Penicillins     Per MAR  . Sulfa Antibiotics     [facility]    Chief Complaint  Patient presents with  . Medical Management of Chronic Issues    Follow up    HPI:  She is a long term resident of this facility being seen for the management of her chronic illnesses. There is little change in her status. Her weight is farily stable in the 130's. She is out of bed daily and propels herself throughout the facility. She is unable to participate in the hpi or ros. There are nursing concerns at this time.    Past Medical History:  Diagnosis Date  . Anemia   . CHF (congestive heart failure) (HCC)   . Depression   . Hyperlipidemia   . Hypertension   . Insomnia 08/11/2013  . MYALGIA 07/13/2007   Qualifier: Diagnosis of  By: Gavin PottersGRANDIS MD, HEIDI    . SCHIZOPHRENIA 04/29/2006   Qualifier: Diagnosis of  By: Erenest RasherVANSTORY MD, MADELEINE    . Thyroid disease   . TREMOR, ESSENTIAL 05/28/2006   Qualifier: Diagnosis of  By: Erenest RasherVANSTORY MD, MADELEINE      History reviewed. No pertinent surgical history.  Social History   Social History  . Marital status: Widowed    Spouse name: N/A  . Number of children: N/A  . Years of education: N/A   Occupational History  . Not on file.   Social History Main Topics  . Smoking status: Former Games developermoker  . Smokeless tobacco: Current User    Types: Snuff  . Alcohol use No  . Drug use: No  . Sexual activity: No   Other Topics Concern  . Not on file   Social History Narrative  . No narrative on file   Family History  Problem Relation Age of Onset  . Family history unknown: Yes      VITAL SIGNS BP (!) 128/50   Pulse 63   Temp 98.1 F (36.7 C) (Oral)   Resp 16   Ht 5\' 3"  (1.6 m)   Wt 134 lb  (60.8 kg)   SpO2 98%   BMI 23.74 kg/m   Patient's Medications  New Prescriptions   No medications on file  Previous Medications   CETIRIZINE (ZYRTEC) 5 MG TABLET    Take 5 mg by mouth as needed for allergies.   CYANOCOBALAMIN 500 MCG TABLET    Take 500 mcg by mouth daily. For B-12 deficiency   DULOXETINE (CYMBALTA) 30 MG CAPSULE    Take 30 mg by mouth daily. For anxiety   FUROSEMIDE (LASIX) 40 MG TABLET    Take 40 mg by mouth 2 (two) times daily.   GUAIFENESIN (ROBITUSSIN) 100 MG/5ML LIQUID    Take 200 mg by mouth every 6 (six) hours as needed for cough. Reported on 06/12/2015   IPRATROPIUM-ALBUTEROL (DUONEB) 0.5-2.5 (3) MG/3ML SOLN    Take 3 mLs by nebulization 3 (three) times daily as needed (wheezing).    LACTULOSE (CHRONULAC) 10 GM/15ML SOLUTION    Take 30 g by mouth every morning.    LEVOTHYROXINE (SYNTHROID, LEVOTHROID) 150 MCG TABLET    Take 1 tablet (150 mcg total)  by mouth daily.   MAGNESIUM HYDROXIDE (MILK OF MAGNESIA) 400 MG/5ML SUSPENSION    Take 30 mLs by mouth daily as needed for mild constipation.   MULTIPLE VITAMINS-MINERALS (MULTIVITAMIN WITH MINERALS) TABLET    Take 1 tablet by mouth daily.   POLYETHYLENE GLYCOL POWDER (MIRALAX) POWDER    Take 17 g by mouth daily. For constipation. Dispense 1 month supply.   POLYVINYL ALCOHOL-POVIDONE (HYPOTEARS) 1.4-0.6 % OPHTHALMIC SOLUTION    Place 1 drop into both eyes 3 (three) times daily. Reported on 09/09/2015   POTASSIUM CHLORIDE SA (K-DUR,KLOR-CON) 20 MEQ TABLET    Take 1 tablet (20 mEq total) by mouth 3 (three) times daily. For heart disease  Modified Medications   No medications on file  Discontinued Medications   No medications on file     SIGNIFICANT DIAGNOSTIC EXAMS  04-16-14: ct of head: 1. No acute intracranial findings. 2. Severe volume loss and chronic microvascular ischemic changes 3. Left maxillary sinus disease, likely chronic.  04-16-14: chest x-ray: No active cardiopulmonary disease.  04-17-14: doppler   bilateral lower extremities: - No evidence of deep vein thrombosis involving the visualized veins of the right lower extremity. - No evidence of deep vein thrombosis involving the visualized veins of the left lower extremity. - No evidence of Baker&'s cyst on the right or left.  05-30-14: left lower extremity doppler: no dvt    LABS REVIEWED:    01-14-15: wbc 6.5; hgb 13.0; hct 38.6; mcv 97.7; plt 145; glcuose 61; bun 20; creat 1.00; k+ 4.1; na++144; liver normal albumin 3.4; tsh 1.263  06-21-15: wbc 4.6; hgb 11.7; hct 34.3; mcv 96.1 plt 131; glucose 81; bun 19; creat 1.02; k+ 3.3; na++143; liver normal albumin 2.9 07-03-15: wbc 5.4; hgb 13.0; hct 38.0; mcv 96.0; plt 131; glucose 94; bun 17; creat 0.98; k+ 3.6; na++143; liver normal albumin 3.1; tsh 20.45  07-26-15: tsh 9.86; free T4; 0.9; free T3: 1.8  08-28-15: tsh 7.24; free T4 1.1; free T3: 1.8   Review of Systems Unable to perform ROS: Dementia      Physical Exam Constitutional: No distress.  Eyes: Conjunctivae are normal.  Neck: Neck supple. No JVD present. No thyromegaly present thyroid has grainy and "hard"   Cardiovascular: Normal rate, regular rhythm and intact distal pulses.   Respiratory: Effort normal and breath sounds normal. No respiratory distress. She has no wheezes.  GI: Soft. Bowel sounds are normal. She exhibits no distension. There is no tenderness.  Musculoskeletal: She exhibits edema.  Able to move all extremities Has left hand contracture has splint   1+ lower extremity edema   Lymphadenopathy:    She has no cervical adenopathy.  Neurological: She is alert.  Skin: Skin is warm and dry. She is not diaphoretic.  Psychiatric: She has a normal mood and affect.      ASSESSMENT/ PLAN:    1. Lower extremity edema: is stable will continue lasix 40 mg twice  daily with k+ 20 meq three times daily and will monitor her status   2. Hypothyroidism: will continue synthroid  150 mcg daily tsh is 7.24; lab work  pending    3.  Hypertension: is presently not on medications; will not make changes will  Monitor  4. Constipation: will continue lactulose 30 cc daily and miralax daily   5. Vascular dementia: no significant change in status; her current weight is 139 pounds is presently stable. ; is presently not on medications;  Will not make changes and will monitor  6. Depression: does receive benefit from cymbalta 30 mg daily   7. CHF: is stable will continue lasix 40 mg twice daily with k+ 20 meq twice daily  8. Schizophrenia: is without change in her status. Is not on antipsychotic medications will monitor   9. Weight loss with FTT: her current weight is 134 pounds; is currently stable;  she is eating better with the staff giving her the snuff.  She has completed her marinol therapy on 08-17-15    MD is aware of resident's narcotic use and is in agreement with current plan of care. We will attempt to wean resident as apropriate   Synthia Innocent NP [facility] Adult [facility]  Contact [phone] Monday through Friday 8am- 5pm  After hours call [phone]

## 2015-10-25 LAB — BASIC METABOLIC PANEL
BUN: 25 mg/dL — AB (ref 4–21)
CREATININE: 1.1 mg/dL (ref 0.5–1.1)
GLUCOSE: 90 mg/dL
POTASSIUM: 3.7 mmol/L (ref 3.4–5.3)
SODIUM: 143 mmol/L (ref 137–147)

## 2015-10-29 NOTE — Progress Notes (Signed)
This encounter was created in error - please disregard.

## 2015-11-11 ENCOUNTER — Non-Acute Institutional Stay (SKILLED_NURSING_FACILITY): Payer: Medicare Other | Admitting: Adult Health

## 2015-11-11 ENCOUNTER — Encounter: Payer: Self-pay | Admitting: Adult Health

## 2015-11-11 DIAGNOSIS — F0393 Unspecified dementia, unspecified severity, with mood disturbance: Secondary | ICD-10-CM

## 2015-11-11 DIAGNOSIS — F0280 Dementia in other diseases classified elsewhere without behavioral disturbance: Secondary | ICD-10-CM | POA: Diagnosis not present

## 2015-11-11 DIAGNOSIS — F0151 Vascular dementia with behavioral disturbance: Secondary | ICD-10-CM | POA: Diagnosis not present

## 2015-11-11 DIAGNOSIS — R634 Abnormal weight loss: Secondary | ICD-10-CM | POA: Diagnosis not present

## 2015-11-11 DIAGNOSIS — F329 Major depressive disorder, single episode, unspecified: Secondary | ICD-10-CM

## 2015-11-11 DIAGNOSIS — E038 Other specified hypothyroidism: Secondary | ICD-10-CM

## 2015-11-11 DIAGNOSIS — F01518 Vascular dementia, unspecified severity, with other behavioral disturbance: Secondary | ICD-10-CM

## 2015-11-11 DIAGNOSIS — R627 Adult failure to thrive: Secondary | ICD-10-CM

## 2015-11-11 DIAGNOSIS — R600 Localized edema: Secondary | ICD-10-CM | POA: Diagnosis not present

## 2015-11-11 DIAGNOSIS — I119 Hypertensive heart disease without heart failure: Secondary | ICD-10-CM | POA: Diagnosis not present

## 2015-11-11 DIAGNOSIS — E034 Atrophy of thyroid (acquired): Secondary | ICD-10-CM | POA: Diagnosis not present

## 2015-11-11 DIAGNOSIS — I509 Heart failure, unspecified: Secondary | ICD-10-CM

## 2015-11-11 NOTE — Progress Notes (Addendum)
Patient ID: Melanie Roberson, female   DOB: 1923-07-29, 82 y.o.   MRN: [id number]    Location:   [facility] Room Number: 135-A Place of Service:  SNF (31)   CODE STATUS: Full Code  Allergies  Allergen Reactions  . Erythromycin     Per MAR  . Penicillins     Per MAR  . Sulfa Antibiotics     Per [facility]    Chief Complaint  Patient presents with  . Medical Management of Chronic Issues    Follow up    HPI:  She is a long term resident of this facility being seen for the management of her chronic illnesses. Overall there is little change in her status. Her weight is slowly trending downward. She has completed appetite stimulants this past summer without lasting effects. Unfortunately; weight loss is an expected outcome in the late stages of dementia. She is unable to fully participate in the hpi or ros. There are no nursing concerns at this time.     Past Medical History:  Diagnosis Date  . Anemia   . CHF (congestive heart failure) (HCC)   . Depression   . Hyperlipidemia   . Hypertension   . Insomnia 08/11/2013  . MYALGIA 07/13/2007   Qualifier: Diagnosis of  By: Gavin Potters MD, HEIDI    . SCHIZOPHRENIA 04/29/2006   Qualifier: Diagnosis of  By: Erenest Rasher MD, MADELEINE    . Thyroid disease   . TREMOR, ESSENTIAL 05/28/2006   Qualifier: Diagnosis of  By: Erenest Rasher MD, MADELEINE      History reviewed. No pertinent surgical history.  Social History   Social History  . Marital status: Widowed    Spouse name: N/A  . Number of children: N/A  . Years of education: N/A   Occupational History  . Not on file.   Social History Main Topics  . Smoking status: Former Games developer  . Smokeless tobacco: Current User    Types: Snuff  . Alcohol use No  . Drug use: No  . Sexual activity: No   Other Topics Concern  . Not on file   Social History Narrative  . No narrative on file   Family History  Problem Relation Age of Onset  . Family history unknown: Yes      VITAL  SIGNS BP 125/66   Pulse 80   Temp 97.2 F (36.2 C) (Oral)   Resp 20   Ht 5\' 3"  (1.6 m)   Wt 133 lb 2 oz (60.4 kg)   SpO2 98%   BMI 23.58 kg/m   Patient's Medications  New Prescriptions   No medications on file  Previous Medications   CETIRIZINE (ZYRTEC) 5 MG TABLET    Take 5 mg by mouth as needed for allergies.   CYANOCOBALAMIN 500 MCG TABLET    Take 500 mcg by mouth daily. For B-12 deficiency   DULOXETINE (CYMBALTA) 30 MG CAPSULE    Take 30 mg by mouth daily. For anxiety   GUAIFENESIN (ROBITUSSIN) 100 MG/5ML LIQUID    Take 200 mg by mouth every 6 (six) hours as needed for cough. Reported on 06/12/2015   LACTULOSE (CHRONULAC) 10 GM/15ML SOLUTION    Take 30 g by mouth every morning.    MAGNESIUM HYDROXIDE (MILK OF MAGNESIA) 400 MG/5ML SUSPENSION    Take 30 mLs by mouth daily as needed for mild constipation.   MULTIPLE VITAMINS-MINERALS (MULTIVITAMIN WITH MINERALS) TABLET    Take 1 tablet by mouth daily.   POLYETHYLENE  GLYCOL POWDER (MIRALAX) POWDER    Take 17 g by mouth daily. For constipation. Dispense 1 month supply.   POLYVINYL ALCOHOL-POVIDONE (HYPOTEARS) 1.4-0.6 % OPHTHALMIC SOLUTION    Place 1 drop into both eyes 3 (three) times daily. Reported on 09/09/2015   Synthroid 175 mcg Take daily    POTASSIUM CHLORIDE SA (K-DUR,KLOR-CON) 20 MEQ TABLET    Take 1 tablet (20 mEq total) by mouth 3 (three) times daily. For heart disease   TORSEMIDE (DEMADEX) 20 MG TABLET    Take 20 mg by mouth 2 (two) times daily.  Modified Medications   No medications on file  Discontinued Medications   FUROSEMIDE (LASIX) 40 MG TABLET    Take 40 mg by mouth 2 (two) times daily.   IPRATROPIUM-ALBUTEROL (DUONEB) 0.5-2.5 (3) MG/3ML SOLN    Take 3 mLs by nebulization 3 (three) times daily as needed (wheezing).    LEVOTHYROXINE (SYNTHROID, LEVOTHROID) 150 MCG TABLET    Take 1 tablet (150 mcg total) by mouth daily.     SIGNIFICANT DIAGNOSTIC EXAMS  04-16-14: ct of head: 1. No acute intracranial findings. 2.  Severe volume loss and chronic microvascular ischemic changes 3. Left maxillary sinus disease, likely chronic.  04-16-14: chest x-ray: No active cardiopulmonary disease.  04-17-14: doppler  bilateral lower extremities: - No evidence of deep vein thrombosis involving the visualized veins of the right lower extremity. - No evidence of deep vein thrombosis involving the visualized veins of the left lower extremity. - No evidence of Baker&'s cyst on the right or left.  05-30-14: left lower extremity doppler: no dvt    LABS REVIEWED:    01-14-15: wbc 6.5; hgb 13.0; hct 38.6; mcv 97.7; plt 145; glcuose 61; bun 20; creat 1.00; k+ 4.1; na++144; liver normal albumin 3.4; tsh 1.263  06-21-15: wbc 4.6; hgb 11.7; hct 34.3; mcv 96.1 plt 131; glucose 81; bun 19; creat 1.02; k+ 3.3; na++143; liver normal albumin 2.9 07-03-15: wbc 5.4; hgb 13.0; hct 38.0; mcv 96.0; plt 131; glucose 94; bun 17; creat 0.98; k+ 3.6; na++143; liver normal albumin 3.1; tsh 20.45  07-26-15: tsh 9.86; free T4; 0.9; free T3: 1.8  08-28-15: tsh 7.24; free T4 1.1; free T3: 1.8 10-21-15: tsh 78.26; free T4: 0.8; free T3: 1.4  10-25-15: glucose 90; bun 25; creat 1.11; k+ 3.7; na++ 143    Review of Systems Unable to perform ROS: Dementia      Physical Exam Constitutional: No distress.  Eyes: Conjunctivae are normal.  Neck: Neck supple. No JVD present. No thyromegaly present thyroid has grainy and "hard"   Cardiovascular: Normal rate, regular rhythm and intact distal pulses.   Respiratory: Effort normal and breath sounds normal. No respiratory distress. She has no wheezes.  GI: Soft. Bowel sounds are normal. She exhibits no distension. There is no tenderness.  Musculoskeletal: She exhibits edema.  Able to move all extremities Has left hand contracture has splint   1+ lower extremity edema   Lymphadenopathy:    She has no cervical adenopathy.  Neurological: She is alert.  Skin: Skin is warm and dry. She is not diaphoretic.    Psychiatric: She has a normal mood and affect.      ASSESSMENT/ PLAN:    1. Lower extremity edema: is stable will continue demadex 20 mg twice  daily with k+ 20 meq three times daily and will monitor her status   2. Hypothyroidism: will continue synthroid  175 mcg daily tsh is 78.26; will repeat her thyroid labs this week  and will monitor her status.   3.  Hypertension: is presently not on medications; will not make changes will  Monitor  4. Constipation: will continue lactulose 30 cc daily and miralax daily   5. Vascular dementia: she is slowly losing weight; she has received appetite stimulants without long term affect; her current weight is 133 pounds .  is presently not on medications;  Will not make changes and will monitor   6. Depression: does receive benefit from cymbalta 30 mg daily   7. CHF: is stable will continue demadex 20  mg twice daily with k+ 20 meq three times daily  8. Schizophrenia: is without change in her status. Is not on antipsychotic medications will monitor   9. Weight loss/ FTT: her current weight is 133 pounds June 2017 her weight was 139 pounds . The staff continues to give out her snuff to her.   She has completed her marinol therapy on 08-17-15. An unfortunate outcome of late stage of dementia is weight loss. Will continue supplements per facility protocol; will monitor    Will check tsh free t3 free t4   Synthia InnocentDeborah Dory Verdun NP [facility]  Contact [phone] Monday through Friday 8am- 5pm  After hours call [phone]

## 2015-12-05 ENCOUNTER — Non-Acute Institutional Stay (SKILLED_NURSING_FACILITY): Payer: Medicare Other | Admitting: Adult Health

## 2015-12-05 ENCOUNTER — Encounter: Payer: Self-pay | Admitting: Adult Health

## 2015-12-05 DIAGNOSIS — E034 Atrophy of thyroid (acquired): Secondary | ICD-10-CM | POA: Diagnosis not present

## 2015-12-05 NOTE — Progress Notes (Signed)
Patient ID: Melanie Roberson, female   DOB: October 20, 1923, 82 y.o.   MRN: [id number]   Location:   [facility] Room Number: 135-A Place of Service:  SNF (31)   CODE STATUS: Full Code  Allergies  Allergen Reactions  . Erythromycin     Per MAR  . Penicillins     Per MAR  . Sulfa Antibiotics     Per [facility]    Chief Complaint  Patient presents with  . Acute Visit    Lab follow up    HPI:  She has been having abnormal thyroid readings with great variance present. She is unable to participate in the hpi or ros; but did tell me that she is feeling good. There are no nursing concerns at this time.    Past Medical History:  Diagnosis Date  . Anemia   . CHF (congestive heart failure) (HCC)   . Depression   . Hyperlipidemia   . Hypertension   . Insomnia 08/11/2013  . MYALGIA 07/13/2007   Qualifier: Diagnosis of  By: Gavin Potters MD, HEIDI    . SCHIZOPHRENIA 04/29/2006   Qualifier: Diagnosis of  By: Erenest Rasher MD, MADELEINE    . Thyroid disease   . TREMOR, ESSENTIAL 05/28/2006   Qualifier: Diagnosis of  By: Erenest Rasher MD, MADELEINE      History reviewed. No pertinent surgical history.  Social History   Social History  . Marital status: Widowed    Spouse name: N/A  . Number of children: N/A  . Years of education: N/A   Occupational History  . Not on file.   Social History Main Topics  . Smoking status: Former Games developer  . Smokeless tobacco: Current User    Types: Snuff  . Alcohol use No  . Drug use: No  . Sexual activity: No   Other Topics Concern  . Not on file   Social History Narrative  . No narrative on file   Family History  Problem Relation Age of Onset  . Family history unknown: Yes      VITAL SIGNS BP 134/60   Pulse 78   Temp 98.8 F (37.1 C) (Oral)   Resp 18   Ht 5\' 3"  (1.6 m)   Wt 133 lb 2 oz (60.4 kg)   SpO2 95%   BMI 23.58 kg/m   Patient's Medications  New Prescriptions   No medications on file  Previous Medications   CETIRIZINE  (ZYRTEC) 5 MG TABLET    Take 5 mg by mouth as needed for allergies.   CYANOCOBALAMIN 500 MCG TABLET    Take 500 mcg by mouth daily. For B-12 deficiency   DULOXETINE (CYMBALTA) 30 MG CAPSULE    Take 30 mg by mouth daily. For anxiety   GUAIFENESIN (ROBITUSSIN) 100 MG/5ML LIQUID    Take 200 mg by mouth every 6 (six) hours as needed for cough. Reported on 06/12/2015   LACTULOSE (CHRONULAC) 10 GM/15ML SOLUTION    Take 30 g by mouth every morning.    LEVOTHYROXINE (SYNTHROID, LEVOTHROID) 175 MCG TABLET    Take 175 mcg by mouth daily before breakfast.   MAGNESIUM HYDROXIDE (MILK OF MAGNESIA) 400 MG/5ML SUSPENSION    Take 30 mLs by mouth daily as needed for mild constipation.   MULTIPLE VITAMINS-MINERALS (MULTIVITAMIN WITH MINERALS) TABLET    Take 1 tablet by mouth daily.   POLYETHYLENE GLYCOL POWDER (MIRALAX) POWDER    Take 17 g by mouth daily. For constipation. Dispense 1 month supply.   POLYVINYL  ALCOHOL-POVIDONE (HYPOTEARS) 1.4-0.6 % OPHTHALMIC SOLUTION    Place 1 drop into both eyes 3 (three) times daily. Reported on 09/09/2015   POTASSIUM CHLORIDE SA (K-DUR,KLOR-CON) 20 MEQ TABLET    Take 1 tablet (20 mEq total) by mouth 3 (three) times daily. For heart disease   TORSEMIDE (DEMADEX) 20 MG TABLET    Take 20 mg by mouth 2 (two) times daily.  Modified Medications   No medications on file  Discontinued Medications   No medications on file     SIGNIFICANT DIAGNOSTIC EXAMS  04-16-14: ct of head: 1. No acute intracranial findings. 2. Severe volume loss and chronic microvascular ischemic changes 3. Left maxillary sinus disease, likely chronic.  04-16-14: chest x-ray: No active cardiopulmonary disease.  04-17-14: doppler  bilateral lower extremities: - No evidence of deep vein thrombosis involving the visualized veins of the right lower extremity. - No evidence of deep vein thrombosis involving the visualized veins of the left lower extremity. - No evidence of Baker&'s cyst on the right or  left.  05-30-14: left lower extremity doppler: no dvt    LABS REVIEWED:    01-14-15: wbc 6.5; hgb 13.0; hct 38.6; mcv 97.7; plt 145; glcuose 61; bun 20; creat 1.00; k+ 4.1; na++144; liver normal albumin 3.4; tsh 1.263  06-21-15: wbc 4.6; hgb 11.7; hct 34.3; mcv 96.1 plt 131; glucose 81; bun 19; creat 1.02; k+ 3.3; na++143; liver normal albumin 2.9 07-03-15: wbc 5.4; hgb 13.0; hct 38.0; mcv 96.0; plt 131; glucose 94; bun 17; creat 0.98; k+ 3.6; na++143; liver normal albumin 3.1; tsh 20.45  07-26-15: tsh 9.86; free T4; 0.9; free T3: 1.8  08-28-15: tsh 7.24; free T4 1.1; free T3: 1.8 10-21-15: tsh 78.26; free T4: 0.8; free T3: 1.4  10-25-15: glucose 90; bun 25; creat 1.11; k+ 3.7; na++ 143  11-13-15: free T4: 2.1; free T3: 3.4 12-04-15: tsh 0.12    Review of Systems Unable to perform ROS: Dementia      Physical Exam Constitutional: No distress.  Eyes: Conjunctivae are normal.  Neck: Neck supple. No JVD present. No thyromegaly present thyroid has grainy and "hard"   Cardiovascular: Normal rate, regular rhythm and intact distal pulses.   Respiratory: Effort normal and breath sounds normal. No respiratory distress. She has no wheezes.  GI: Soft. Bowel sounds are normal. She exhibits no distension. There is no tenderness.  Musculoskeletal: She exhibits edema.  Able to move all extremities Has left hand contracture has splint   1+ lower extremity edema   Lymphadenopathy:    She has no cervical adenopathy.  Neurological: She is alert.  Skin: Skin is warm and dry. She is not diaphoretic.  Psychiatric: She has a normal mood and affect.      ASSESSMENT/ PLAN:   1. Hypothyroidism: tsh is 0.12; will lower her synthroid to 150 mcg daily and will check thyroid labs in 4 weeks; will monitor      Synthia Innocenteborah Rayvon Brandvold NP [facility]  Contact [phone] Monday through Friday 8am- 5pm  After hours call [phone]

## 2015-12-12 ENCOUNTER — Non-Acute Institutional Stay (SKILLED_NURSING_FACILITY): Payer: Medicare Other | Admitting: Adult Health

## 2015-12-12 ENCOUNTER — Encounter: Payer: Self-pay | Admitting: Adult Health

## 2015-12-12 DIAGNOSIS — R627 Adult failure to thrive: Secondary | ICD-10-CM | POA: Diagnosis not present

## 2015-12-12 DIAGNOSIS — I119 Hypertensive heart disease without heart failure: Secondary | ICD-10-CM

## 2015-12-12 DIAGNOSIS — F209 Schizophrenia, unspecified: Secondary | ICD-10-CM

## 2015-12-12 DIAGNOSIS — F01518 Vascular dementia, unspecified severity, with other behavioral disturbance: Secondary | ICD-10-CM

## 2015-12-12 DIAGNOSIS — E034 Atrophy of thyroid (acquired): Secondary | ICD-10-CM | POA: Diagnosis not present

## 2015-12-12 DIAGNOSIS — R600 Localized edema: Secondary | ICD-10-CM

## 2015-12-12 DIAGNOSIS — F0151 Vascular dementia with behavioral disturbance: Secondary | ICD-10-CM

## 2015-12-12 DIAGNOSIS — I509 Heart failure, unspecified: Secondary | ICD-10-CM | POA: Diagnosis not present

## 2015-12-12 DIAGNOSIS — R634 Abnormal weight loss: Secondary | ICD-10-CM | POA: Diagnosis not present

## 2015-12-12 NOTE — Progress Notes (Signed)
Patient ID: Melanie Roberson, female   DOB: Jun 25, 1923, 82 y.o.   MRN: [id number]   Location:   [facility] Room Number: 135-A Place of Service:  SNF (31)   CODE STATUS: Full Code  Allergies  Allergen Reactions  . Erythromycin     Per MAR  . Penicillins     Per MAR  . Sulfa Antibiotics     Per [facility]    Chief Complaint  Patient presents with  . Medical Management of Chronic Issues    Follow up    HPI:  She is a long term resident of this facility being seen for the management of her chronic illnesses. Overall her status is without change. She does get out of bed on a daily basis and propels herself throughout the facility. She is unable to fully participate in the hpi or ros. There are no nursing concerns at this time.   Past Medical History:  Diagnosis Date  . Anemia   . CHF (congestive heart failure) (HCC)   . Depression   . Hyperlipidemia   . Hypertension   . Insomnia 08/11/2013  . MYALGIA 07/13/2007   Qualifier: Diagnosis of  By: Gavin Potters MD, HEIDI    . SCHIZOPHRENIA 04/29/2006   Qualifier: Diagnosis of  By: Erenest Rasher MD, MADELEINE    . Thyroid disease   . TREMOR, ESSENTIAL 05/28/2006   Qualifier: Diagnosis of  By: Erenest Rasher MD, MADELEINE      No past surgical history on file.  Social History   Social History  . Marital status: Widowed    Spouse name: N/A  . Number of children: N/A  . Years of education: N/A   Occupational History  . Not on file.   Social History Main Topics  . Smoking status: Former Games developer  . Smokeless tobacco: Current User    Types: Snuff  . Alcohol use No  . Drug use: No  . Sexual activity: No   Other Topics Concern  . Not on file   Social History Narrative  . No narrative on file   Family History  Problem Relation Age of Onset  . Family history unknown: Yes      VITAL SIGNS BP 138/76   Pulse 79   Temp 98.2 F (36.8 C) (Oral)   Resp 18   Ht 5\' 3"  (1.6 m)   Wt 133 lb (60.3 kg)   SpO2 95%   BMI 23.56 kg/m    Patient's Medications  New Prescriptions   No medications on file  Previous Medications   CETIRIZINE (ZYRTEC) 5 MG TABLET    Take 5 mg by mouth as needed for allergies.   CYANOCOBALAMIN 500 MCG TABLET    Take 500 mcg by mouth daily. For B-12 deficiency   DULOXETINE (CYMBALTA) 30 MG CAPSULE    Take 30 mg by mouth daily. For anxiety   GUAIFENESIN (ROBITUSSIN) 100 MG/5ML LIQUID    Take 200 mg by mouth every 6 (six) hours as needed for cough. Reported on 06/12/2015   LACTULOSE (CHRONULAC) 10 GM/15ML SOLUTION    Take 30 g by mouth every morning.    LEVOTHYROXINE (SYNTHROID, LEVOTHROID) 175 MCG TABLET    Take 175 mcg by mouth daily before breakfast.   MAGNESIUM HYDROXIDE (MILK OF MAGNESIA) 400 MG/5ML SUSPENSION    Take 30 mLs by mouth daily as needed for mild constipation.   MULTIPLE VITAMINS-MINERALS (MULTIVITAMIN WITH MINERALS) TABLET    Take 1 tablet by mouth daily.   POLYETHYLENE GLYCOL POWDER (  MIRALAX) POWDER    Take 17 g by mouth daily. For constipation. Dispense 1 month supply.   POLYVINYL ALCOHOL-POVIDONE (HYPOTEARS) 1.4-0.6 % OPHTHALMIC SOLUTION    Place 1 drop into both eyes 3 (three) times daily. Reported on 09/09/2015   POTASSIUM CHLORIDE SA (K-DUR,KLOR-CON) 20 MEQ TABLET    Take 1 tablet (20 mEq total) by mouth 3 (three) times daily. For heart disease   TORSEMIDE (DEMADEX) 20 MG TABLET    Take 20 mg by mouth 2 (two) times daily.  Modified Medications   No medications on file  Discontinued Medications   No medications on file     SIGNIFICANT DIAGNOSTIC EXAMS  04-16-14: ct of head: 1. No acute intracranial findings. 2. Severe volume loss and chronic microvascular ischemic changes 3. Left maxillary sinus disease, likely chronic.  04-16-14: chest x-ray: No active cardiopulmonary disease.  04-17-14: doppler  bilateral lower extremities: - No evidence of deep vein thrombosis involving the visualized veins of the right lower extremity. - No evidence of deep vein thrombosis involving the  visualized veins of the left lower extremity. - No evidence of Baker&'s cyst on the right or left.  05-30-14: left lower extremity doppler: no dvt    LABS REVIEWED:    01-14-15: wbc 6.5; hgb 13.0; hct 38.6; mcv 97.7; plt 145; glcuose 61; bun 20; creat 1.00; k+ 4.1; na++144; liver normal albumin 3.4; tsh 1.263  06-21-15: wbc 4.6; hgb 11.7; hct 34.3; mcv 96.1 plt 131; glucose 81; bun 19; creat 1.02; k+ 3.3; na++143; liver normal albumin 2.9 07-03-15: wbc 5.4; hgb 13.0; hct 38.0; mcv 96.0; plt 131; glucose 94; bun 17; creat 0.98; k+ 3.6; na++143; liver normal albumin 3.1; tsh 20.45  07-26-15: tsh 9.86; free T4; 0.9; free T3: 1.8  08-28-15: tsh 7.24; free T4 1.1; free T3: 1.8 10-21-15: tsh 78.26; free T4: 0.8; free T3: 1.4  10-25-15: glucose 90; bun 25; creat 1.11; k+ 3.7; na++ 143    Review of Systems Unable to perform ROS: Dementia      Physical Exam Constitutional: No distress.  Eyes: Conjunctivae are normal.  Neck: Neck supple. No JVD present. No thyromegaly present thyroid has grainy and "hard"   Cardiovascular: Normal rate, regular rhythm and intact distal pulses.   Respiratory: Effort normal and breath sounds normal. No respiratory distress. She has no wheezes.  GI: Soft. Bowel sounds are normal. She exhibits no distension. There is no tenderness.  Musculoskeletal: She exhibits edema.  Able to move all extremities Has left hand contracture has splint   1+ lower extremity edema   Lymphadenopathy:    She has no cervical adenopathy.  Neurological: She is alert.  Skin: Skin is warm and dry. She is not diaphoretic.  Psychiatric: She has a normal mood and affect.      ASSESSMENT/ PLAN:    1. Lower extremity edema: is stable will continue demadex 20 mg twice  daily with k+ 20 meq three times daily and will monitor her status   2. Hypothyroidism: will continue synthroid  175 mcg daily tsh is 78.26; will repeat her thyroid labs this week and will monitor her status.   3.   Hypertension: is presently not on medications; will not make changes will  Monitor  4. Constipation: will continue lactulose 30 cc daily and miralax daily   5. Vascular dementia: she is slowly losing weight; she has received appetite stimulants without long term affect; her current weight is 133 pounds .  is presently not on medications;  Will  not make changes and will monitor   6. Depression: does receive benefit from cymbalta 30 mg daily   7. CHF: is stable will continue demadex 20  mg twice daily with k+ 20 meq three times daily  8. Schizophrenia: is without change in her status. Is not on antipsychotic medications will monitor   9. Weight loss/ FTT: her current weight is 133 pounds June 2017 her weight was 139 pounds . The staff continues to give out her snuff to her.   She has completed her marinol therapy on 08-17-15. An unfortunate outcome of late stage of dementia is weight loss. Will continue supplements per facility protocol; will monitor       Synthia Innocenteborah Green NP [facility]  Contact [phone] Monday through Friday 8am- 5pm  After hours call [phone]

## 2016-01-07 LAB — TSH: TSH: 0.95 u[IU]/mL (ref <=5.90)

## 2016-01-13 ENCOUNTER — Non-Acute Institutional Stay (SKILLED_NURSING_FACILITY): Payer: Medicare Other | Admitting: Adult Health

## 2016-01-13 ENCOUNTER — Encounter: Payer: Self-pay | Admitting: Adult Health

## 2016-01-13 DIAGNOSIS — R634 Abnormal weight loss: Secondary | ICD-10-CM

## 2016-01-13 DIAGNOSIS — F209 Schizophrenia, unspecified: Secondary | ICD-10-CM | POA: Diagnosis not present

## 2016-01-13 DIAGNOSIS — F0280 Dementia in other diseases classified elsewhere without behavioral disturbance: Secondary | ICD-10-CM | POA: Diagnosis not present

## 2016-01-13 DIAGNOSIS — F0151 Vascular dementia with behavioral disturbance: Secondary | ICD-10-CM

## 2016-01-13 DIAGNOSIS — F329 Major depressive disorder, single episode, unspecified: Secondary | ICD-10-CM

## 2016-01-13 DIAGNOSIS — E034 Atrophy of thyroid (acquired): Secondary | ICD-10-CM | POA: Diagnosis not present

## 2016-01-13 DIAGNOSIS — R627 Adult failure to thrive: Secondary | ICD-10-CM

## 2016-01-13 DIAGNOSIS — I509 Heart failure, unspecified: Secondary | ICD-10-CM | POA: Diagnosis not present

## 2016-01-13 DIAGNOSIS — F01518 Vascular dementia, unspecified severity, with other behavioral disturbance: Secondary | ICD-10-CM

## 2016-01-13 DIAGNOSIS — I119 Hypertensive heart disease without heart failure: Secondary | ICD-10-CM | POA: Diagnosis not present

## 2016-01-13 DIAGNOSIS — F0393 Unspecified dementia, unspecified severity, with mood disturbance: Secondary | ICD-10-CM

## 2016-01-13 NOTE — Progress Notes (Signed)
Patient ID: Melanie Roberson, female   DOB: Sep 08, 1923, 82 y.o.   MRN: [id number]   Location:   [facility] Room Number: 135-A Place of Service:  SNF (31)   CODE STATUS: Full Code  Allergies  Allergen Reactions  . Erythromycin     Per MAR  . Penicillins     Per MAR  . Sulfa Antibiotics     Per [facility]    Chief Complaint  Patient presents with  . Medical Management of Chronic Issues    Follow up    HPI:  She is a long term resident of this facility being seen for the management of her chronic illnesses. Overall there is little change in her status. She does continue to get out of bed on a daily basis. She is unable to participate in the hpi or ros. There are no nursing concerns at this time.    Past Medical History:  Diagnosis Date  . Anemia   . CHF (congestive heart failure) (HCC)   . Depression   . Hyperlipidemia   . Hypertension   . Insomnia 08/11/2013  . MYALGIA 07/13/2007   Qualifier: Diagnosis of  By: Gavin Potters MD, HEIDI    . SCHIZOPHRENIA 04/29/2006   Qualifier: Diagnosis of  By: Erenest Rasher MD, MADELEINE    . Thyroid disease   . TREMOR, ESSENTIAL 05/28/2006   Qualifier: Diagnosis of  By: Erenest Rasher MD, MADELEINE      History reviewed. No pertinent surgical history.  Social History   Social History  . Marital status: Widowed    Spouse name: N/A  . Number of children: N/A  . Years of education: N/A   Occupational History  . Not on file.   Social History Main Topics  . Smoking status: Former Games developer  . Smokeless tobacco: Current User    Types: Snuff  . Alcohol use No  . Drug use: No  . Sexual activity: No   Other Topics Concern  . Not on file   Social History Narrative  . No narrative on file   Family History  Problem Relation Age of Onset  . Family history unknown: Yes      VITAL SIGNS BP (!) 144/53   Pulse 68   Temp 97.5 F (36.4 C) (Oral)   Resp 18   Ht 5\' 3"  (1.6 m)   Wt 132 lb 4 oz (60 kg)   SpO2 96%   BMI 23.43 kg/m    Patient's Medications  New Prescriptions   No medications on file  Previous Medications   ACETAMINOPHEN (TYLENOL) 500 MG TABLET    Take 500 mg by mouth every 6 (six) hours as needed.   CETIRIZINE (ZYRTEC) 5 MG TABLET    Take 5 mg by mouth as needed for allergies.   CYANOCOBALAMIN 500 MCG TABLET    Take 500 mcg by mouth daily. For B-12 deficiency   DULOXETINE (CYMBALTA) 30 MG CAPSULE    Take 30 mg by mouth daily. For anxiety   GUAIFENESIN (ROBITUSSIN) 100 MG/5ML LIQUID    Take 200 mg by mouth every 6 (six) hours as needed for cough. Reported on 06/12/2015   LACTULOSE (CHRONULAC) 10 GM/15ML SOLUTION    Take 30 g by mouth every morning.    LEVOTHYROXINE (SYNTHROID, LEVOTHROID) 150 MCG TABLET    Take 150 mcg by mouth daily before breakfast.   MAGNESIUM HYDROXIDE (MILK OF MAGNESIA) 400 MG/5ML SUSPENSION    Take 30 mLs by mouth daily as needed for mild constipation.  MULTIPLE VITAMINS-MINERALS (MULTIVITAMIN WITH MINERALS) TABLET    Take 1 tablet by mouth daily.   POLYETHYLENE GLYCOL POWDER (MIRALAX) POWDER    Take 17 g by mouth daily. For constipation. Dispense 1 month supply.   POLYVINYL ALCOHOL-POVIDONE (HYPOTEARS) 1.4-0.6 % OPHTHALMIC SOLUTION    Place 1 drop into both eyes 3 (three) times daily. Reported on 09/09/2015   POTASSIUM CHLORIDE SA (K-DUR,KLOR-CON) 20 MEQ TABLET    Take 1 tablet (20 mEq total) by mouth 3 (three) times daily. For heart disease   TORSEMIDE (DEMADEX) 20 MG TABLET    Take 20 mg by mouth 2 (two) times daily.  Modified Medications   No medications on file  Discontinued Medications   LEVOTHYROXINE (SYNTHROID, LEVOTHROID) 175 MCG TABLET    Take 175 mcg by mouth daily before breakfast.     SIGNIFICANT DIAGNOSTIC EXAMS  04-16-14: ct of head: 1. No acute intracranial findings. 2. Severe volume loss and chronic microvascular ischemic changes 3. Left maxillary sinus disease, likely chronic.  04-16-14: chest x-ray: No active cardiopulmonary disease.  04-17-14: doppler   bilateral lower extremities: - No evidence of deep vein thrombosis involving the visualized veins of the right lower extremity. - No evidence of deep vein thrombosis involving the visualized veins of the left lower extremity. - No evidence of Baker&'s cyst on the right or left.  05-30-14: left lower extremity doppler: no dvt    LABS REVIEWED:    01-14-15: wbc 6.5; hgb 13.0; hct 38.6; mcv 97.7; plt 145; glcuose 61; bun 20; creat 1.00; k+ 4.1; na++144; liver normal albumin 3.4; tsh 1.263  06-21-15: wbc 4.6; hgb 11.7; hct 34.3; mcv 96.1 plt 131; glucose 81; bun 19; creat 1.02; k+ 3.3; na++143; liver normal albumin 2.9 07-03-15: wbc 5.4; hgb 13.0; hct 38.0; mcv 96.0; plt 131; glucose 94; bun 17; creat 0.98; k+ 3.6; na++143; liver normal albumin 3.1; tsh 20.45  07-26-15: tsh 9.86; free T4; 0.9; free T3: 1.8  08-28-15: tsh 7.24; free T4 1.1; free T3: 1.8 10-21-15: tsh 78.26; free T4: 0.8; free T3: 1.4  10-25-15: glucose 90; bun 25; creat 1.11; k+ 3.7; na++ 143  12-04-15: tsh 0.12; free T4: 2.1; free T3: 3.4  01-03-16: free T3: 2.4; free T4: 1.26    Review of Systems Unable to perform ROS: Dementia      Physical Exam Constitutional: No distress.  Eyes: Conjunctivae are normal.  Neck: Neck supple. No JVD present. No thyromegaly present thyroid has grainy and "hard"   Cardiovascular: Normal rate, regular rhythm and intact distal pulses.   Respiratory: Effort normal and breath sounds normal. No respiratory distress. She has no wheezes.  GI: Soft. Bowel sounds are normal. She exhibits no distension. There is no tenderness.  Musculoskeletal: She exhibits edema.  Able to move all extremities Has left hand contracture has splint   1+ lower extremity edema   Lymphadenopathy:    She has no cervical adenopathy.  Neurological: She is alert.  Skin: Skin is warm and dry. She is not diaphoretic.  Psychiatric: She has a normal mood and affect.      ASSESSMENT/ PLAN:    1. Lower extremity edema: is  stable will continue demadex 20 mg twice  daily with k+ 20 meq three times daily and will monitor her status   2. Hypothyroidism: will continue synthroid  150  mcg daily   free T3: 2.4; free T4: 1.26  .   3.  Hypertension: is presently not on medications; will not make changes will  Monitor  4. Constipation: will continue lactulose 30 cc daily and miralax daily   5. Vascular dementia: she is slowly losing weight; she has received appetite stimulants without long term affect; her current weight is 132 pounds .  is presently not on medications;  Will not make changes and will monitor   6. Depression: does receive benefit from cymbalta 30 mg daily   7. CHF: is stable will continue demadex 20  mg twice daily with k+ 20 meq three times daily  8. Schizophrenia: is without change in her status. Is not on antipsychotic medications will monitor   9. Weight loss/ FTT: her current weight is 132 pounds June 2017 her weight was 139 pounds . The staff continues to give out her snuff to her.   She has completed her marinol therapy on 08-17-15. An unfortunate outcome of late stage of dementia is weight loss. Will continue supplements per facility protocol; will monitor        Synthia Innocenteborah Donn Zanetti NP [facility]  Contact [phone] Monday through Friday 8am- 5pm  After hours call [phone]

## 2016-02-20 ENCOUNTER — Encounter: Payer: Self-pay | Admitting: Adult Health

## 2016-02-20 ENCOUNTER — Non-Acute Institutional Stay (SKILLED_NURSING_FACILITY): Payer: Medicare Other | Admitting: Adult Health

## 2016-02-20 DIAGNOSIS — F0280 Dementia in other diseases classified elsewhere without behavioral disturbance: Secondary | ICD-10-CM | POA: Diagnosis not present

## 2016-02-20 DIAGNOSIS — F329 Major depressive disorder, single episode, unspecified: Secondary | ICD-10-CM | POA: Diagnosis not present

## 2016-02-20 DIAGNOSIS — R627 Adult failure to thrive: Secondary | ICD-10-CM | POA: Diagnosis not present

## 2016-02-20 DIAGNOSIS — I119 Hypertensive heart disease without heart failure: Secondary | ICD-10-CM | POA: Diagnosis not present

## 2016-02-20 DIAGNOSIS — F01518 Vascular dementia, unspecified severity, with other behavioral disturbance: Secondary | ICD-10-CM

## 2016-02-20 DIAGNOSIS — F0151 Vascular dementia with behavioral disturbance: Secondary | ICD-10-CM

## 2016-02-20 DIAGNOSIS — E034 Atrophy of thyroid (acquired): Secondary | ICD-10-CM | POA: Diagnosis not present

## 2016-02-20 DIAGNOSIS — F0393 Unspecified dementia, unspecified severity, with mood disturbance: Secondary | ICD-10-CM

## 2016-02-20 DIAGNOSIS — I509 Heart failure, unspecified: Secondary | ICD-10-CM

## 2016-02-20 DIAGNOSIS — F209 Schizophrenia, unspecified: Secondary | ICD-10-CM

## 2016-02-20 NOTE — Progress Notes (Signed)
[facility]    Place of Service:  SNF (31)   CODE STATUS: full code   Allergies  Allergen Reactions  . Erythromycin     Per MAR  . Penicillins     Per MAR  . [facility]ulfa Antibiotics     [facility]    Chief Complaint  Patient presents with  . Medical Management of Chronic Issues    HPI:  She is a long term resident of this facility being seen for the management of her chronic illnesses. Overall there is little change in her status. She is unable to fully participate in the hpi or ros. There are no nursing concerns at this time.    Past Medical History:  Diagnosis Date  . Anemia   . CHF (congestive heart failure) (HCC)   . Depression   . Hyperlipidemia   . Hypertension   . Insomnia 08/11/2013  . MYALGIA 07/13/2007   Qualifier: Diagnosis of  By: Gavin PottersGRANDIS MD, HEIDI    . SCHIZOPHRENIA 04/29/2006   Qualifier: Diagnosis of  By: Erenest RasherVANSTORY MD, MADELEINE    . Thyroid disease   . TREMOR, ESSENTIAL 05/28/2006   Qualifier: Diagnosis of  By: Erenest RasherVANSTORY MD, MADELEINE      No past surgical history on file.  Social History   Social History  . Marital status: Widowed    Spouse name: N/A  . Number of children: N/A  . Years of education: N/A   Occupational History  . Not on file.   Social History Main Topics  . Smoking status: Former Games developermoker  . Smokeless tobacco: Current User    Types: Snuff  . Alcohol use No  . Drug use: No  . Sexual activity: No   Other Topics Concern  . Not on file   Social History Narrative  . No narrative on file   Family History  Problem Relation Age of Onset  . Family history unknown: Yes      VITAL SIGNS BP (!) 140/56   Pulse 82   Temp 98.7 F (37.1 C)   Resp 18   Ht 5\' 3"  (1.6 m)   Wt 132 lb (59.9 kg)   SpO2 96%   BMI 23.38 kg/m   Patient's Medications  New Prescriptions   No medications on file  Previous Medications   ACETAMINOPHEN (TYLENOL) 500 MG TABLET    Take 500 mg by mouth every 6 (six) hours as needed.   CETIRIZINE (ZYRTEC) 5 MG TABLET    Take 5 mg by mouth as needed for allergies.   CYANOCOBALAMIN 500 MCG TABLET    Take 500 mcg by mouth daily. For B-12 deficiency   DULOXETINE (CYMBALTA) 30 MG CAPSULE    Take 30 mg by mouth daily. For anxiety   GUAIFENESIN (ROBITUSSIN) 100 MG/5ML LIQUID    Take 200 mg by mouth every 6 (six) hours as needed for cough. Reported on 06/12/2015   LACTULOSE (CHRONULAC) 10 GM/15ML SOLUTION    Take 30 g by mouth every morning.    LEVOTHYROXINE (SYNTHROID, LEVOTHROID) 150 MCG TABLET    Take 150 mcg by mouth daily before breakfast.   MAGNESIUM HYDROXIDE (MILK OF MAGNESIA) 400 MG/5ML SUSPENSION    Take 30 mLs by mouth daily as needed for mild constipation.   MULTIPLE VITAMINS-MINERALS (MULTIVITAMIN WITH MINERALS) TABLET    Take 1 tablet by mouth daily.   POLYETHYLENE GLYCOL POWDER (MIRALAX) POWDER    Take 17 g by mouth daily. For constipation. Dispense 1  month supply.   POLYVINYL ALCOHOL-POVIDONE (HYPOTEARS) 1.4-0.6 % OPHTHALMIC SOLUTION    Place 1 drop into both eyes 3 (three) times daily. Reported on 09/09/2015   POTASSIUM CHLORIDE SA (K-DUR,KLOR-CON) 20 MEQ TABLET    Take 1 tablet (20 mEq total) by mouth 3 (three) times daily. For heart disease   TORSEMIDE (DEMADEX) 20 MG TABLET    Take 20 mg by mouth 2 (two) times daily.  Modified Medications   No medications on file  Discontinued Medications   No medications on file     SIGNIFICANT DIAGNOSTIC EXAMS  04-16-14: ct of head: 1. No acute intracranial findings. 2. Severe volume loss and chronic microvascular ischemic changes 3. Left maxillary sinus disease, likely chronic.   LABS REVIEWED:    06-21-15: wbc 4.6; hgb 11.7; hct 34.3; mcv 96.1 plt 131; glucose 81; bun 19; creat 1.02; k+ 3.3; na++143; liver normal albumin 2.9 07-03-15: wbc 5.4; hgb 13.0; hct 38.0; mcv 96.0; plt 131; glucose 94; bun 17; creat 0.98; k+ 3.6; na++143; liver normal albumin 3.1; tsh 20.45  07-26-15: tsh 9.86; free T4; 0.9; free T3: 1.8  08-28-15: tsh  7.24; free T4 1.1; free T3: 1.8 10-21-15: tsh 78.26; free T4: 0.8; free T3: 1.4  10-25-15: glucose 90; bun 25; creat 1.11; k+ 3.7; na++ 143  12-04-15: tsh 0.12; free T4: 2.1; free T3: 3.4  01-03-16: free T3: 2.4; free T4: 1.26    Review of Systems Unable to perform ROS: Dementia      Physical Exam Constitutional: No distress.  Eyes: Conjunctivae are normal.  Neck: Neck supple. No JVD present. No thyromegaly present thyroid has grainy and "hard"   Cardiovascular: Normal rate, regular rhythm and intact distal pulses.   Respiratory: Effort normal and breath sounds normal. No respiratory distress. She has no wheezes.  GI: Soft. Bowel sounds are normal. She exhibits no distension. There is no tenderness.  Musculoskeletal: She exhibits edema.  Able to move all extremities Has left hand contracture has splint   1+ lower extremity edema   Lymphadenopathy:    She has no cervical adenopathy.  Neurological: She is alert.  Skin: Skin is warm and dry. She is not diaphoretic.  Psychiatric: She has a normal mood and affect.      ASSESSMENT/ PLAN:    1. Lower extremity edema: is stable will continue demadex 20 mg twice  daily with k+ 20 meq three times daily and will monitor her status   2. Hypothyroidism: will continue synthroid  150  mcg daily   free T3: 2.4; free T4: 1.26  .   3.  Hypertension: is presently not on medications; will not make changes will  Monitor  4. Constipation: will continue lactulose 30 cc daily and miralax daily   5. Vascular dementia: she is slowly losing weight; she has received appetite stimulants without long term affect; her current weight is 132 pounds .  is presently not on medications;  Will not make changes and will monitor   6. Depression: does receive benefit from cymbalta 30 mg daily   7. CHF: is stable will continue demadex 20  mg twice daily with k+ 20 meq three times daily  8. Schizophrenia: is without change in her status. Is not on antipsychotic  medications will monitor   9. Weight loss/ FTT: her current weight is 132 pounds June 2017 her weight was 139 pounds . The staff continues to give out her snuff to her.   She has completed her marinol therapy on 08-17-15.  An unfortunate outcome of late stage of dementia is weight loss. Will continue supplements per facility protocol; will monitor     MD is aware of resident's narcotic use and is in agreement with current plan of care. We will attempt to wean resident as apropriate   Synthia Innocent NP [facility]  Contact [phone] Monday through Friday 8am- 5pm  After hours call [phone]

## 2016-04-06 ENCOUNTER — Non-Acute Institutional Stay (SKILLED_NURSING_FACILITY): Payer: Medicare Other | Admitting: Adult Health

## 2016-04-06 ENCOUNTER — Encounter: Payer: Self-pay | Admitting: Adult Health

## 2016-04-06 DIAGNOSIS — F0280 Dementia in other diseases classified elsewhere without behavioral disturbance: Secondary | ICD-10-CM | POA: Diagnosis not present

## 2016-04-06 DIAGNOSIS — I509 Heart failure, unspecified: Secondary | ICD-10-CM | POA: Diagnosis not present

## 2016-04-06 DIAGNOSIS — F0151 Vascular dementia with behavioral disturbance: Secondary | ICD-10-CM

## 2016-04-06 DIAGNOSIS — I119 Hypertensive heart disease without heart failure: Secondary | ICD-10-CM | POA: Diagnosis not present

## 2016-04-06 DIAGNOSIS — E876 Hypokalemia: Secondary | ICD-10-CM

## 2016-04-06 DIAGNOSIS — E034 Atrophy of thyroid (acquired): Secondary | ICD-10-CM

## 2016-04-06 DIAGNOSIS — R627 Adult failure to thrive: Secondary | ICD-10-CM

## 2016-04-06 DIAGNOSIS — F209 Schizophrenia, unspecified: Secondary | ICD-10-CM | POA: Diagnosis not present

## 2016-04-06 DIAGNOSIS — F329 Major depressive disorder, single episode, unspecified: Secondary | ICD-10-CM

## 2016-04-06 DIAGNOSIS — F01518 Vascular dementia, unspecified severity, with other behavioral disturbance: Secondary | ICD-10-CM

## 2016-04-06 DIAGNOSIS — F0393 Unspecified dementia, unspecified severity, with mood disturbance: Secondary | ICD-10-CM

## 2016-04-06 NOTE — Progress Notes (Signed)
Provider:   Location:  [facility]  SNF (31)   PCP: Kirt Boys, DO Patient Care Team: Kirt Boys, DO as PCP - General (Internal Medicine) Sharee Holster, NP as Nurse Practitioner (Geriatric Medicine) [facility] (Skilled Nursing Facility)  Extended Emergency Contact Information Primary Emergency Contact: Lars Pinks [place] Home Phone: [phone] Relation: None Secondary Emergency Contact: [facility] Address: [address]          [place] Home Phone: [phone] Relation: None  Code Status: full code  Goals of Care: Advanced Directive information Advanced Directives 01/13/2016  Does Patient Have a Medical Advance Directive? No  Would patient like information on creating a medical advance directive? No - patient declined information      Allergies  Allergen Reactions  . Erythromycin     Per MAR  . Penicillins     Per MAR  . Sulfa Antibiotics     Per [facility]     Chief Complaint  Patient presents with  . Annual Exam    HPI: Patient is a 83 y.o. female seen today for an annual comprehensive examination. Her status has had little change over the past year. She has not required hospitalizations over the past year. She does continue to use snuff. This is handed out by the nursing staff in order to help her eat her meals. She is unable to fully participate in the hpi ros. There are no nursing concerns at this time.    Past Medical History:  Diagnosis Date  . Anemia   . CHF (congestive heart failure) (HCC)   . Depression   . Hyperlipidemia   . Hypertension   . Insomnia 08/11/2013  . MYALGIA 07/13/2007   Qualifier: Diagnosis of  By: Gavin Potters MD, HEIDI    . SCHIZOPHRENIA 04/29/2006   Qualifier: Diagnosis of  By: Erenest Rasher MD, MADELEINE    . Thyroid disease   . TREMOR, ESSENTIAL 05/28/2006   Qualifier: Diagnosis of  By: Erenest Rasher MD, MADELEINE     No past surgical history on file.  reports that she has quit smoking. Her smokeless tobacco use includes Snuff. She reports that she does not drink alcohol or use drugs. Social History   Social History  . Marital status: Widowed    Spouse name: N/A  . Number of children: N/A  . Years of education: N/A   Occupational History  . Not on file.   Social History Main Topics  . Smoking status: Former Games developer  . Smokeless tobacco: Current User    Types: Snuff  . Alcohol use No  . Drug use: No  . Sexual activity: No   Other Topics Concern  . Not on file   Social History Narrative  . No narrative on file   Family History  Problem Relation Age of Onset  . Family history unknown: Yes    Vitals:   04/06/16 1529  BP: (!) 143/53  Pulse: 69  Resp: 18  Temp: 98.8 F (37.1 C)  SpO2: 97%  Weight: 133 lb (60.3 kg)  Height: 5\' 1"  (1.549 m)   Body mass index is 25.13 kg/m.    Patient's Medications  New Prescriptions   No medications on file  Previous Medications   ACETAMINOPHEN (TYLENOL) 500 MG TABLET    Take 500 mg by mouth every 6 (six) hours as needed.   CETIRIZINE (ZYRTEC) 5 MG TABLET    Take 5 mg by mouth as  needed for allergies.   CYANOCOBALAMIN 500 MCG TABLET    Take 500 mcg by mouth daily. For B-12 deficiency   DULOXETINE (CYMBALTA) 30 MG CAPSULE    Take 30 mg by mouth daily. For anxiety   GUAIFENESIN (ROBITUSSIN) 100 MG/5ML LIQUID    Take 200 mg by mouth every 6 (six) hours as needed for cough. Reported on 06/12/2015   LACTULOSE (CHRONULAC) 10 GM/15ML SOLUTION    Take 30 g by mouth every morning.    LEVOTHYROXINE (SYNTHROID, LEVOTHROID) 150 MCG TABLET    Take 150 mcg by mouth daily before breakfast.   MAGNESIUM HYDROXIDE (MILK OF MAGNESIA) 400 MG/5ML SUSPENSION    Take 30 mLs by mouth daily as needed for mild constipation.   MULTIPLE VITAMINS-MINERALS (MULTIVITAMIN WITH MINERALS) TABLET    Take 1 tablet by mouth daily.   POLYETHYLENE GLYCOL POWDER (MIRALAX) POWDER    Take 17 g by mouth daily. For  constipation. Dispense 1 month supply.   POLYVINYL ALCOHOL-POVIDONE (HYPOTEARS) 1.4-0.6 % OPHTHALMIC SOLUTION    Place 1 drop into both eyes 3 (three) times daily. Reported on 09/09/2015   POTASSIUM CHLORIDE SA (K-DUR,KLOR-CON) 20 MEQ TABLET    Take 1 tablet (20 mEq total) by mouth 3 (three) times daily. For heart disease   TORSEMIDE (DEMADEX) 20 MG TABLET    Take 20 mg by mouth 2 (two) times daily.  Modified Medications   No medications on file  Discontinued Medications   No medications on file     SIGNIFICANT DIAGNOSTIC EXAMS  04-16-14: ct of head: 1. No acute intracranial findings. 2. Severe volume loss and chronic microvascular ischemic changes 3. Left maxillary sinus disease, likely chronic.   LABS REVIEWED:    06-21-15: wbc 4.6; hgb 11.7; hct 34.3; mcv 96.1 plt 131; glucose 81; bun 19; creat 1.02; k+ 3.3; na++143; liver normal albumin 2.9 07-03-15: wbc 5.4; hgb 13.0; hct 38.0; mcv 96.0; plt 131; glucose 94; bun 17; creat 0.98; k+ 3.6; na++143; liver normal albumin 3.1; tsh 20.45  07-26-15: tsh 9.86; free T4; 0.9; free T3: 1.8  08-28-15: tsh 7.24; free T4 1.1; free T3: 1.8 10-21-15: tsh 78.26; free T4: 0.8; free T3: 1.4  10-25-15: glucose 90; bun 25; creat 1.11; k+ 3.7; na++ 143  12-04-15: tsh 0.12; free T4: 2.1; free T3: 3.4  01-03-16: free T3: 2.4; free T4: 1.26    Review of Systems Unable to perform ROS: Dementia      Physical Exam Constitutional: No distress.  Eyes: Conjunctivae are normal.  Neck: Neck supple. No JVD present. No thyromegaly present thyroid has grainy and "hard"   Cardiovascular: Normal rate, regular rhythm and intact distal pulses.   Respiratory: Effort normal and breath sounds normal. No respiratory distress. She has no wheezes.  GI: Soft. Bowel sounds are normal. She exhibits no distension. There is no tenderness.  Musculoskeletal: She exhibits edema.  Able to move all extremities Has left hand contracture has splint   1+ lower extremity edema     Lymphadenopathy:    She has no cervical adenopathy.  Neurological: She is alert.  Skin: Skin is warm and dry. She is not diaphoretic.  Psychiatric: She has a normal mood and affect.      ASSESSMENT/ PLAN:    1. Lower extremity edema: is stable will continue demadex 20 mg twice  daily with k+ 20 meq three times daily and will monitor her status   2. Hypothyroidism: will continue synthroid  150  mcg daily   free T3:  2.4; free T4: 1.26  .   3.  Hypertension: is presently not on medications; will not make changes will  Monitor  4. Constipation: will continue lactulose 30 cc daily and miralax daily   5. Vascular dementia: she is slowly losing weight; she has received appetite stimulants without long term affect; her current weight is 133 pounds .  is presently not on medications;  Will not make changes and will monitor   6. Depression: does receive benefit from cymbalta 30 mg daily   7. CHF: is stable will continue demadex 20  mg twice daily with k+ 20 meq three times daily  8. Schizophrenia: is without change in her status. Is not on antipsychotic medications will monitor   9. Weight loss/ FTT: her current weight is 133 pounds June 2017 her weight was 139 pounds . The staff continues to give out her snuff to her.   She has completed her marinol therapy on 08-17-15. An unfortunate outcome of late stage of dementia is weight loss. Will continue supplements per facility protocol; will monitor     Her health maintenance is up to date  Time spent with patient  45  minutes >50% time spent counseling; reviewing medical record; tests; labs; and developing future plan of care    MD is aware of resident's narcotic use and is in agreement with current plan of care. We will wean dosage as appropriate for resident   Synthia Innocent NP [facility]  Contact [phone] Monday through Friday 8am- 5pm  After hours call [phone]

## 2016-05-02 ENCOUNTER — Emergency Department (HOSPITAL_COMMUNITY): Payer: Medicare Other

## 2016-05-02 ENCOUNTER — Encounter (HOSPITAL_COMMUNITY): Payer: Self-pay | Admitting: *Deleted

## 2016-05-02 ENCOUNTER — Emergency Department (HOSPITAL_COMMUNITY)
Admission: EM | Admit: 2016-05-02 | Discharge: 2016-05-02 | Disposition: A | Discharge status: Home / Self Care | Payer: Medicare Other | Attending: Emergency Medicine | Admitting: Emergency Medicine

## 2016-05-02 DIAGNOSIS — Z87891 Personal history of nicotine dependence: Secondary | ICD-10-CM | POA: Insufficient documentation

## 2016-05-02 DIAGNOSIS — I110 Hypertensive heart disease with heart failure: Secondary | ICD-10-CM | POA: Insufficient documentation

## 2016-05-02 DIAGNOSIS — R0600 Dyspnea, unspecified: Secondary | ICD-10-CM | POA: Insufficient documentation

## 2016-05-02 DIAGNOSIS — E876 Hypokalemia: Secondary | ICD-10-CM | POA: Insufficient documentation

## 2016-05-02 DIAGNOSIS — I509 Heart failure, unspecified: Secondary | ICD-10-CM | POA: Diagnosis not present

## 2016-05-02 DIAGNOSIS — E039 Hypothyroidism, unspecified: Secondary | ICD-10-CM | POA: Diagnosis not present

## 2016-05-02 DIAGNOSIS — R0602 Shortness of breath: Secondary | ICD-10-CM | POA: Diagnosis present

## 2016-05-02 HISTORY — DX: Vascular dementia, unspecified severity, without behavioral disturbance, psychotic disturbance, mood disturbance, and anxiety: F01.50

## 2016-05-02 LAB — CBC
HCT: 33.6 % — ABNORMAL LOW (ref 36.0–46.0)
Hemoglobin: 11 g/dL — ABNORMAL LOW (ref 12.0–15.0)
MCH: 32 pg (ref 26.0–34.0)
MCHC: 32.7 g/dL (ref 30.0–36.0)
MCV: 97.7 fL (ref 78.0–100.0)
PLATELETS: 143 10*3/uL — AB (ref 150–400)
RBC: 3.44 MIL/uL — ABNORMAL LOW (ref 3.87–5.11)
RDW: 13.3 % (ref 11.5–15.5)
WBC: 5.9 10*3/uL (ref 4.0–10.5)

## 2016-05-02 LAB — BASIC METABOLIC PANEL
Anion gap: 9 (ref 5–15)
BUN: 37 mg/dL — AB (ref 6–20)
CALCIUM: 9.1 mg/dL (ref 8.9–10.3)
CO2: 30 mmol/L (ref 22–32)
CREATININE: 1.1 mg/dL — AB (ref 0.44–1.00)
Chloride: 103 mmol/L (ref 101–111)
GFR calc Af Amer: 49 mL/min — ABNORMAL LOW (ref >60)
GFR, EST NON AFRICAN AMERICAN: 43 mL/min — AB (ref >60)
GLUCOSE: 153 mg/dL — AB (ref 65–99)
Potassium: 3.4 mmol/L — ABNORMAL LOW (ref 3.5–5.1)
SODIUM: 142 mmol/L (ref 135–145)

## 2016-05-02 LAB — I-STAT TROPONIN, ED: TROPONIN I, POC: 0 ng/mL (ref 0.00–0.08)

## 2016-05-02 MED ORDER — POTASSIUM CHLORIDE CRYS ER 20 MEQ PO TBCR
20.0000 meq | EXTENDED_RELEASE_TABLET | Freq: Once | ORAL | Status: AC
  Administered 2016-05-02: 20 meq via ORAL
  Filled 2016-05-02: qty 1

## 2016-05-02 NOTE — ED Notes (Signed)
Patient transported to X-ray 

## 2016-05-02 NOTE — ED Provider Notes (Signed)
[facility]-EMERGENCY DEPT Provider Note   CSN: [id number] Arrival date & time: 05/02/16  1801     History   Chief Complaint Chief Complaint  Patient presents with  . Shortness of Breath    HPI Melanie Roberson is a 83 y.o. female.  Patient noted by family member to appear sob.  Pt is limited historian - denies any complaint - level 5 caveat.  No reported fevers. No cough. Pt denies chest pain. No leg swelling. Pt unsure if recent change in meds.    The history is provided by the patient, the nursing home and the EMS personnel. The history is limited by the condition of the patient.  Shortness of Breath     Past Medical History:  Diagnosis Date  . Anemia   . CHF (congestive heart failure) (HCC)   . Depression   . Hyperlipidemia   . Hypertension   . Insomnia 08/11/2013  . MYALGIA 07/13/2007   Qualifier: Diagnosis of  By: Gavin Potters MD, HEIDI    . SCHIZOPHRENIA 04/29/2006   Qualifier: Diagnosis of  By: Erenest Rasher MD, MADELEINE    . Thyroid disease   . TREMOR, ESSENTIAL 05/28/2006   Qualifier: Diagnosis of  By: Erenest Rasher MD, MADELEINE    . Vascular dementia     Patient Active Problem List   Diagnosis Date Noted  . Benign hypertensive heart disease without heart failure 11/11/2015  . Weight loss 05/06/2015  . Failure to thrive in adult 05/06/2015  . Hypokalemia 12/01/2013  . B12 deficiency anemia 12/01/2013  . Vascular dementia with behavior disturbance 08/11/2013  . Hypothyroidism 08/11/2013  . Depression due to dementia 06/11/2013  . Congestive heart failure (HCC) 05/19/2012  . Constipation 05/19/2012  . Schizophrenia, unspecified type (HCC) 04/29/2006    History reviewed. No pertinent surgical history.  OB History    No data available       Home Medications    Prior to Admission medications   Medication Sig Start Date End Date Taking? Authorizing Provider  acetaminophen (TYLENOL) 500 MG tablet Take 500 mg by mouth every 6 (six) hours as needed.    Historical  Provider, MD  cetirizine (ZYRTEC) 5 MG tablet Take 5 mg by mouth as needed for allergies.    Historical Provider, MD  cyanocobalamin 500 MCG tablet Take 500 mcg by mouth daily. For B-12 deficiency    Historical Provider, MD  DULoxetine (CYMBALTA) 30 MG capsule Take 30 mg by mouth daily. For anxiety    Historical Provider, MD  guaiFENesin (ROBITUSSIN) 100 MG/5ML liquid Take 200 mg by mouth every 6 (six) hours as needed for cough. Reported on 06/12/2015    Historical Provider, MD  lactulose (CHRONULAC) 10 GM/15ML solution Take 30 g by mouth every morning.     Historical Provider, MD  levothyroxine (SYNTHROID, LEVOTHROID) 150 MCG tablet Take 150 mcg by mouth daily before breakfast.    Historical Provider, MD  magnesium hydroxide (MILK OF MAGNESIA) 400 MG/5ML suspension Take 30 mLs by mouth daily as needed for mild constipation.    Historical Provider, MD  Multiple Vitamins-Minerals (MULTIVITAMIN WITH MINERALS) tablet Take 1 tablet by mouth daily.    Historical Provider, MD  polyethylene glycol powder (MIRALAX) powder Take 17 g by mouth daily. For constipation. Dispense 1 month supply.    Historical Provider, MD  polyvinyl alcohol-povidone (HYPOTEARS) 1.4-0.6 % ophthalmic solution Place 1 drop into both eyes 3 (three) times daily. Reported on 09/09/2015    Historical Provider, MD  potassium chloride SA (K-DUR,KLOR-CON) 20 MEQ  tablet Take 1 tablet (20 mEq total) by mouth 3 (three) times daily. For heart disease Patient taking differently: Take 20 mEq by mouth 2 (two) times daily. For heart disease 06/24/15   Sharee Holstereborah S Green, NP  torsemide (DEMADEX) 20 MG tablet Take 20 mg by mouth 2 (two) times daily.    Historical Provider, MD    Family History Family History  Problem Relation Age of Onset  . Family history unknown: Yes    Social History Social History  Substance Use Topics  . Smoking status: Former Games developermoker  . Smokeless tobacco: Current User    Types: Snuff  . Alcohol use No     Allergies     Erythromycin; Penicillins; and Sulfa antibiotics   Review of Systems Review of Systems  Unable to perform ROS: Mental status change  Respiratory: Positive for shortness of breath.   level 5 caveat, hx mental illness, ?dementia - level 5 caveat.    Physical Exam Updated Vital Signs BP 167/80 (BP Location: Right Arm)   Pulse 91   Temp 97.8 F (36.6 C) (Axillary)   Resp 18   SpO2 94%   Physical Exam  Constitutional:  Thin, elderly.   HENT:  Head: Atraumatic.  Mouth/Throat: Oropharynx is clear and moist.  Eyes: Conjunctivae are normal. Pupils are equal, round, and reactive to light. No scleral icterus.  Neck: Neck supple. No tracheal deviation present. No thyromegaly present.  No stiffness or rigidity  Cardiovascular: Normal rate, regular rhythm, normal heart sounds and intact distal pulses.  Exam reveals no gallop and no friction rub.   No murmur heard. Pulmonary/Chest: Effort normal and breath sounds normal. No respiratory distress.  Abdominal: Soft. Normal appearance and bowel sounds are normal. She exhibits no distension. There is no tenderness.  Genitourinary:  Genitourinary Comments: No cva tenderness  Musculoskeletal: She exhibits no edema or tenderness.  Neurological: She is alert.  Skin: Skin is warm and dry. No rash noted.  Psychiatric: She has a normal mood and affect.  Nursing note and vitals reviewed.    ED Treatments / Results  Labs (all labs ordered are listed, but only abnormal results are displayed) Results for orders placed or performed during the hospital encounter of 05/02/16  Basic metabolic panel  Result Value Ref Range   Sodium 142 135 - 145 mmol/L   Potassium 3.4 (L) 3.5 - 5.1 mmol/L   Chloride 103 101 - 111 mmol/L   CO2 30 22 - 32 mmol/L   Glucose, Bld 153 (H) 65 - 99 mg/dL   BUN 37 (H) 6 - 20 mg/dL   Creatinine, Ser 1.611.10 (H) 0.44 - 1.00 mg/dL   Calcium 9.1 8.9 - 09.610.3 mg/dL   GFR calc non Af Amer 43 (L) >60 mL/min   GFR calc Af Amer 49  (L) >60 mL/min   Anion gap 9 5 - 15  CBC  Result Value Ref Range   WBC 5.9 4.0 - 10.5 K/uL   RBC 3.44 (L) 3.87 - 5.11 MIL/uL   Hemoglobin 11.0 (L) 12.0 - 15.0 g/dL   HCT 04.533.6 (L) 40.936.0 - 81.146.0 %   MCV 97.7 78.0 - 100.0 fL   MCH 32.0 26.0 - 34.0 pg   MCHC 32.7 30.0 - 36.0 g/dL   RDW 91.413.3 78.211.5 - 95.615.5 %   Platelets 143 (L) 150 - 400 K/uL  I-stat troponin, ED  Result Value Ref Range   Troponin i, poc 0.00 0.00 - 0.08 ng/mL   Comment 3  Dg Chest 2 View  Result Date: 05/02/2016 CLINICAL DATA:  Shortness of breath and altered mental status. EXAM: CHEST  2 VIEW COMPARISON:  Chest x-rays dated 04/16/2014 and 03/03/2009. FINDINGS: Heart size and mediastinal contours are stable. Atherosclerotic changes again noted at the aortic arch. Lungs are clear. No pleural effusion or pneumothorax seen. No acute or suspicious osseous finding. IMPRESSION: 1. No active cardiopulmonary disease. No evidence of pneumonia or pulmonary edema. 2. Aortic atherosclerosis. Electronically Signed   By: Bary Richard M.D.   On: 05/02/2016 19:05    EKG  EKG Interpretation  Date/Time:  Saturday May 02 2016 18:25:10 EST Ventricular Rate:  87 PR Interval:    QRS Duration: 114 QT Interval:  391 QTC Calculation: 471 R Axis:   -47 Text Interpretation:  Sinus rhythm No previous tracing Nonspecific ST abnormality Confirmed by Denton Lank  MD, Caryn Bee ([id number]) on 05/02/2016 8:26:45 PM       Radiology Dg Chest 2 View  Result Date: 05/02/2016 CLINICAL DATA:  Shortness of breath and altered mental status. EXAM: CHEST  2 VIEW COMPARISON:  Chest x-rays dated 04/16/2014 and 03/03/2009. FINDINGS: Heart size and mediastinal contours are stable. Atherosclerotic changes again noted at the aortic arch. Lungs are clear. No pleural effusion or pneumothorax seen. No acute or suspicious osseous finding. IMPRESSION: 1. No active cardiopulmonary disease. No evidence of pneumonia or pulmonary edema. 2. Aortic atherosclerosis. Electronically  Signed   By: Bary Richard M.D.   On: 05/02/2016 19:05    Procedures Procedures (including critical care time)  Medications Ordered in ED Medications - No data to display   Initial Impression / Assessment and Plan / ED Course  I have reviewed the triage vital signs and the nursing notes.  Pertinent labs & imaging results that were available during my care of the patient were reviewed by me and considered in my medical decision making (see chart for details).  Iv ns.   Labs.   Reviewed nursing notes and prior charts for additional history.   Recheck pt, no chest pain, no sob.  Afeb.  cxr neg.   Patient currently asymptomatic, and appears stable for d/c.    Final Clinical Impressions(s) / ED Diagnoses   Final diagnoses:  None    New Prescriptions New Prescriptions   No medications on file     Cathren Laine, MD 05/02/16 2027

## 2016-05-02 NOTE — ED Notes (Signed)
[facility]

## 2016-05-02 NOTE — ED Triage Notes (Signed)
Per EMS, [facility]. Pt's granddaughter states the pt has had SOB and AMS since she visited her at 1430 today. Granddaughter states the pt appeared normal last week.   Pt received 5mg  albuterol en route.  BP 142/64 CBG 153 HR 80 RR 18

## 2016-05-02 NOTE — Discharge Instructions (Signed)
It was our pleasure to provide your ER care today - we hope that you feel better.  Your potassium level was slightly low (3.4) - eat plenty of fruits and vegetables.   Follow up with primary care doctor in the next 1-2 weeks.   Return to ER if worse, new symptoms, fevers, difficulty breathing, chest pain, or other concern.

## 2016-05-04 ENCOUNTER — Encounter: Payer: Self-pay | Admitting: Adult Health

## 2016-05-04 ENCOUNTER — Non-Acute Institutional Stay (SKILLED_NURSING_FACILITY): Payer: Medicare Other | Admitting: Adult Health

## 2016-05-04 DIAGNOSIS — R627 Adult failure to thrive: Secondary | ICD-10-CM

## 2016-05-04 DIAGNOSIS — I119 Hypertensive heart disease without heart failure: Secondary | ICD-10-CM

## 2016-05-04 DIAGNOSIS — F329 Major depressive disorder, single episode, unspecified: Secondary | ICD-10-CM

## 2016-05-04 DIAGNOSIS — F0280 Dementia in other diseases classified elsewhere without behavioral disturbance: Secondary | ICD-10-CM | POA: Diagnosis not present

## 2016-05-04 DIAGNOSIS — K5901 Slow transit constipation: Secondary | ICD-10-CM

## 2016-05-04 DIAGNOSIS — F0151 Vascular dementia with behavioral disturbance: Secondary | ICD-10-CM | POA: Diagnosis not present

## 2016-05-04 DIAGNOSIS — F01518 Vascular dementia, unspecified severity, with other behavioral disturbance: Secondary | ICD-10-CM

## 2016-05-04 DIAGNOSIS — E034 Atrophy of thyroid (acquired): Secondary | ICD-10-CM | POA: Diagnosis not present

## 2016-05-04 DIAGNOSIS — R634 Abnormal weight loss: Secondary | ICD-10-CM

## 2016-05-04 DIAGNOSIS — F0393 Unspecified dementia, unspecified severity, with mood disturbance: Secondary | ICD-10-CM

## 2016-05-04 DIAGNOSIS — I509 Heart failure, unspecified: Secondary | ICD-10-CM | POA: Diagnosis not present

## 2016-05-04 DIAGNOSIS — F209 Schizophrenia, unspecified: Secondary | ICD-10-CM

## 2016-05-04 NOTE — Progress Notes (Signed)
Location:   [facility] Room Number: 135 A Place of Service:  SNF (31)   CODE STATUS: Full Code  Allergies  Allergen Reactions  . Erythromycin     Per MAR  . Penicillins     Per MAR  . Sulfa Antibiotics     Per [facility]    Chief Complaint  Patient presents with  . Medical Management of Chronic Issues    Routine Visit    HPI:  She is a long term resident of this facility being seen for the management of her chronic illnesses. Overall her status is without significant change. She was sent to the ED per family request for concerns of shortness of breath; there was nothing found out of the ordinary. She is unable to participate in the hpi or ros, there are no nursing concerns at this time.   Past Medical History:  Diagnosis Date  . Anemia   . CHF (congestive heart failure) (HCC)   . Depression   . Hyperlipidemia   . Hypertension   . Insomnia 08/11/2013  . MYALGIA 07/13/2007   Qualifier: Diagnosis of  By: Gavin Potters MD, HEIDI    . SCHIZOPHRENIA 04/29/2006   Qualifier: Diagnosis of  By: Erenest Rasher MD, MADELEINE    . Thyroid disease   . TREMOR, ESSENTIAL 05/28/2006   Qualifier: Diagnosis of  By: Erenest Rasher MD, MADELEINE    . Vascular dementia     History reviewed. No pertinent surgical history.  Social History   Social History  . Marital status: Widowed    Spouse name: N/A  . Number of children: N/A  . Years of education: N/A   Occupational History  . Not on file.   Social History Main Topics  . Smoking status: Former Games developer  . Smokeless tobacco: Current User    Types: Snuff  . Alcohol use No  . Drug use: No  . Sexual activity: No   Other Topics Concern  . Not on file   Social History Narrative  . No narrative on file   Family History  Problem Relation Age of Onset  . Family history unknown: Yes      VITAL SIGNS BP (!) 145/59   Pulse 73   Temp 98.6 F (37 C)   Resp 16   Ht 5\' 1"  (1.549 m)   Wt 133 lb (60.3 kg)   SpO2 97% Comment: room air   BMI 25.13 kg/m   Patient's Medications  New Prescriptions   No medications on file  Previous Medications   ACETAMINOPHEN (TYLENOL) 500 MG TABLET    Take 500 mg by mouth every 6 (six) hours as needed.   CETIRIZINE (ZYRTEC) 5 MG TABLET    Take 5 mg by mouth as needed for allergies.   CYANOCOBALAMIN 500 MCG TABLET    Take 500 mcg by mouth daily. For B-12 deficiency   DULOXETINE (CYMBALTA) 30 MG CAPSULE    Take 30 mg by mouth daily. For anxiety   GUAIFENESIN (ROBITUSSIN) 100 MG/5ML LIQUID    Take 200 mg by mouth every 6 (six) hours as needed for cough. Reported on 06/12/2015   LACTULOSE (CHRONULAC) 10 GM/15ML SOLUTION    Take 30 g by mouth every morning.    LEVOTHYROXINE (SYNTHROID, LEVOTHROID) 150 MCG TABLET    Take 150 mcg by mouth daily before breakfast.   MAGNESIUM HYDROXIDE (MILK OF MAGNESIA) 400 MG/5ML SUSPENSION    Take 30 mLs by mouth daily as needed for mild constipation.   MULTIPLE VITAMINS-MINERALS (  MULTIVITAMIN WITH MINERALS) TABLET    Take 1 tablet by mouth daily.   POLYETHYLENE GLYCOL POWDER (MIRALAX) POWDER    Take 17 g by mouth daily. For constipation. Dispense 1 month supply.   POLYVINYL ALCOHOL-POVIDONE (FRESHKOTE) 2.7-2 % SOLN    Instill 1 drop in both eyes three times a day   POTASSIUM CHLORIDE SA (KLOR-CON M20) 20 MEQ TABLET    Take 20 mEq by mouth 2 (two) times daily.   SKIN PROTECTANTS, MISC. (CALAZIME SKIN PROTECTANT EX)    Apply to bilateral buttocks and coccyx area 2 times daily and as needed   TORSEMIDE (DEMADEX) 20 MG TABLET    Take 20 mg by mouth 2 (two) times daily.   UNABLE TO FIND    Med Pass 120 cc by mouth 3 times daily  Modified Medications   No medications on file  Discontinued Medications   POLYVINYL ALCOHOL-POVIDONE (HYPOTEARS) 1.4-0.6 % OPHTHALMIC SOLUTION    Place 1 drop into both eyes 3 (three) times daily. Reported on 09/09/2015   POTASSIUM CHLORIDE SA (K-DUR,KLOR-CON) 20 MEQ TABLET    Take 1 tablet (20 mEq total) by mouth 3 (three) times daily. For  heart disease     SIGNIFICANT DIAGNOSTIC EXAMS  04-16-14: ct of head: 1. No acute intracranial findings. 2. Severe volume loss and chronic microvascular ischemic changes 3. Left maxillary sinus disease, likely chronic.  05-07-16: chest x-ray: 1. No active cardiopulmonary disease. No evidence of pneumonia or pulmonary edema. 2. Aortic atherosclerosis.   LABS REVIEWED:    06-21-15: wbc 4.6; hgb 11.7; hct 34.3; mcv 96.1 plt 131; glucose 81; bun 19; creat 1.02; k+ 3.3; na++143; liver normal albumin 2.9 07-03-15: wbc 5.4; hgb 13.0; hct 38.0; mcv 96.0; plt 131; glucose 94; bun 17; creat 0.98; k+ 3.6; na++143; liver normal albumin 3.1; tsh 20.45  07-26-15: tsh 9.86; free T4; 0.9; free T3: 1.8  08-28-15: tsh 7.24; free T4 1.1; free T3: 1.8 10-21-15: tsh 78.26; free T4: 0.8; free T3: 1.4  10-25-15: glucose 90; bun 25; creat 1.11; k+ 3.7; na++ 143  12-04-15: tsh 0.12; free T4: 2.1; free T3: 3.4  01-03-16: free T3: 2.4; free T4: 1.26  05-02-16: wbc 5.9; hgb 11.0; hct 33.6; mcv 97.7; plt 143 glucose 153 bun 37; creat 1.10; k+ 3.4 na++ 142   Review of Systems Unable to perform ROS: Dementia      Physical Exam Constitutional: No distress.  Eyes: Conjunctivae are normal.  Neck: Neck supple. No JVD present. No thyromegaly present thyroid has grainy and "hard"   Cardiovascular: Normal rate, regular rhythm and intact distal pulses.   Respiratory: Effort normal and breath sounds normal. No respiratory distress. She has no wheezes.  GI: Soft. Bowel sounds are normal. She exhibits no distension. There is no tenderness.  Musculoskeletal: She exhibits edema.  Able to move all extremities Has left hand contracture has splint   1+ lower extremity edema   Lymphadenopathy:    She has no cervical adenopathy.  Neurological: She is alert.  Skin: Skin is warm and dry. She is not diaphoretic.  Psychiatric: She has a normal mood and affect.      ASSESSMENT/ PLAN:    1.  Congestive heart failure : is stable  will continue demadex 20 mg twice  daily with k+ 20 meq three times daily and will monitor her status   2. Hypothyroidism: will continue synthroid  150  mcg daily   free T3: 2.4; free T4: 1.26  .   3.  Hypertension: is presently not on medications; will not make changes will  Monitor  4. Constipation: will continue lactulose 30 cc daily and miralax daily   5. Vascular dementia: she is slowly losing weight; she has received appetite stimulants without long term affect; her current weight is 133 pounds .  is presently not on medications;  Will not make changes and will monitor   6. Depression: does receive benefit from cymbalta 30 mg daily   7. Schizophrenia: is without change in her status. Is not on antipsychotic medications will monitor   8. Weight loss/ FTT: her current weight is 133 pounds June 2017 her weight was 139 pounds . The staff continues to give out her snuff to her.   She has completed her marinol therapy on 08-17-15. An unfortunate outcome of late stage of dementia is weight loss. Will continue supplements per facility protocol; will monitor    Will check  cmp; tsh free t3 and free t4    Synthia Innocent NP [facility]  Contact [phone] Monday through Friday 8am- 5pm  After hours call [phone]

## 2016-05-06 LAB — BASIC METABOLIC PANEL
BUN: 24 mg/dL — AB (ref 4–21)
CREATININE: 0.8 mg/dL (ref 0.5–1.1)
Glucose: 92 mg/dL
POTASSIUM: 4.1 mmol/L (ref 3.4–5.3)
SODIUM: 143 mmol/L (ref 137–147)

## 2016-05-06 LAB — HEPATIC FUNCTION PANEL
ALK PHOS: 96 U/L (ref 25–125)
ALT: 10 U/L (ref 7–35)
AST: 21 U/L (ref 13–35)
Bilirubin, Total: 0.7 mg/dL

## 2016-05-06 LAB — TSH: TSH: 0.2 u[IU]/mL — AB (ref 0.41–5.90)

## 2016-06-01 ENCOUNTER — Non-Acute Institutional Stay (SKILLED_NURSING_FACILITY): Payer: Medicare Other | Admitting: Adult Health

## 2016-06-01 ENCOUNTER — Encounter: Payer: Self-pay | Admitting: Adult Health

## 2016-06-01 DIAGNOSIS — F01518 Vascular dementia, unspecified severity, with other behavioral disturbance: Secondary | ICD-10-CM

## 2016-06-01 DIAGNOSIS — I509 Heart failure, unspecified: Secondary | ICD-10-CM

## 2016-06-01 DIAGNOSIS — F0393 Unspecified dementia, unspecified severity, with mood disturbance: Secondary | ICD-10-CM

## 2016-06-01 DIAGNOSIS — F0151 Vascular dementia with behavioral disturbance: Secondary | ICD-10-CM

## 2016-06-01 DIAGNOSIS — R627 Adult failure to thrive: Secondary | ICD-10-CM

## 2016-06-01 DIAGNOSIS — F209 Schizophrenia, unspecified: Secondary | ICD-10-CM | POA: Diagnosis not present

## 2016-06-01 DIAGNOSIS — I119 Hypertensive heart disease without heart failure: Secondary | ICD-10-CM

## 2016-06-01 DIAGNOSIS — E034 Atrophy of thyroid (acquired): Secondary | ICD-10-CM

## 2016-06-01 DIAGNOSIS — F0280 Dementia in other diseases classified elsewhere without behavioral disturbance: Secondary | ICD-10-CM

## 2016-06-01 DIAGNOSIS — F329 Major depressive disorder, single episode, unspecified: Secondary | ICD-10-CM

## 2016-06-01 DIAGNOSIS — R634 Abnormal weight loss: Secondary | ICD-10-CM

## 2016-06-01 NOTE — Progress Notes (Signed)
Location:   [facility] Room Number: 135 A Place of Service:  SNF (31)   CODE STATUS: Full Code  Allergies  Allergen Reactions  . Erythromycin     Per MAR  . Penicillins     Per MAR  . Sulfa Antibiotics     Per [facility]    Chief Complaint  Patient presents with  . Medical Management of Chronic Issues    1 month follow up    HPI:  She is a long term resident of this facility being seen for the management of her chronic illnesses. Overall there is little change in her status. She does get out of bed daily; she is unable to participate in the hpi or ros. There are no nursing concerns at this time.    Past Medical History:  Diagnosis Date  . Anemia   . CHF (congestive heart failure) (HCC)   . Depression   . Hyperlipidemia   . Hypertension   . Insomnia 08/11/2013  . MYALGIA 07/13/2007   Qualifier: Diagnosis of  By: Gavin Potters MD, HEIDI    . SCHIZOPHRENIA 04/29/2006   Qualifier: Diagnosis of  By: Erenest Rasher MD, MADELEINE    . Thyroid disease   . TREMOR, ESSENTIAL 05/28/2006   Qualifier: Diagnosis of  By: Erenest Rasher MD, MADELEINE    . Vascular dementia     History reviewed. No pertinent surgical history.  Social History   Social History  . Marital status: Widowed    Spouse name: N/A  . Number of children: N/A  . Years of education: N/A   Occupational History  . Not on file.   Social History Main Topics  . Smoking status: Former Games developer  . Smokeless tobacco: Current User    Types: Snuff  . Alcohol use No  . Drug use: No  . Sexual activity: No   Other Topics Concern  . Not on file   Social History Narrative  . No narrative on file   Family History  Problem Relation Age of Onset  . Family history unknown: Yes      VITAL SIGNS BP (!) 148/54   Pulse 70   Temp 98.2 F (36.8 C)   Resp 18   Ht  (1.549 m)   Wt 128 lb 6.4 oz (58.2 kg)   SpO2 96%   BMI 24.26 kg/m   Patient's Medications  New Prescriptions   No medications on file    Previous Medications   ACETAMINOPHEN (TYLENOL) 500 MG TABLET    Take 500 mg by mouth every 6 (six) hours as needed.   CETIRIZINE (ZYRTEC) 5 MG TABLET    Take 5 mg by mouth as needed for allergies.   CYANOCOBALAMIN 500 MCG TABLET    Take 500 mcg by mouth daily. For B-12 deficiency   DULOXETINE (CYMBALTA) 30 MG CAPSULE    Take 30 mg by mouth daily. For anxiety   GUAIFENESIN (ROBITUSSIN) 100 MG/5ML LIQUID    Take 200 mg by mouth every 6 (six) hours as needed for cough. Reported on 06/12/2015   LACTULOSE (CHRONULAC) 10 GM/15ML SOLUTION    Take 30 g by mouth every morning.    LEVOTHYROXINE (SYNTHROID, LEVOTHROID) 150 MCG TABLET    Take 150 mcg by mouth daily before breakfast.   MAGNESIUM HYDROXIDE (MILK OF MAGNESIA) 400 MG/5ML SUSPENSION    Take 30 mLs by mouth daily as needed for mild constipation.   MULTIPLE VITAMINS-MINERALS (MULTIVITAMIN WITH MINERALS) TABLET    Take 1 tablet by  mouth daily.   POLYETHYLENE GLYCOL POWDER (MIRALAX) POWDER    Take 17 g by mouth daily. For constipation. Dispense 1 month supply.   POLYVINYL ALCOHOL-POVIDONE (FRESHKOTE) 2.7-2 % SOLN    Instill 1 drop in both eyes three times a day   POTASSIUM CHLORIDE SA (KLOR-CON M20) 20 MEQ TABLET    Take 20 mEq by mouth 2 (two) times daily.   SKIN PROTECTANTS, MISC. (CALAZIME SKIN PROTECTANT EX)    Apply to bilateral buttocks and coccyx area 2 times daily and as needed   TORSEMIDE (DEMADEX) 20 MG TABLET    Take 20 mg by mouth 2 (two) times daily.   UNABLE TO FIND    Med Pass 120 cc by mouth 3 times daily  Modified Medications   No medications on file  Discontinued Medications   No medications on file     SIGNIFICANT DIAGNOSTIC EXAMS  04-16-14: ct of head: 1. No acute intracranial findings. 2. Severe volume loss and chronic microvascular ischemic changes 3. Left maxillary sinus disease, likely chronic.  05-07-16: chest x-ray: 1. No active cardiopulmonary disease. No evidence of pneumonia or pulmonary edema. 2. Aortic  atherosclerosis.  05-13-16: right hand; No significant abnormality seen  LABS REVIEWED:    06-21-15: wbc 4.6; hgb 11.7; hct 34.3; mcv 96.1 plt 131; glucose 81; bun 19; creat 1.02; k+ 3.3; na++143; liver normal albumin 2.9 07-03-15: wbc 5.4; hgb 13.0; hct 38.0; mcv 96.0; plt 131; glucose 94; bun 17; creat 0.98; k+ 3.6; na++143; liver normal albumin 3.1; tsh 20.45  07-26-15: tsh 9.86; free T4; 0.9; free T3: 1.8  08-28-15: tsh 7.24; free T4 1.1; free T3: 1.8 10-21-15: tsh 78.26; free T4: 0.8; free T3: 1.4  10-25-15: glucose 90; bun 25; creat 1.11; k+ 3.7; na++ 143  12-04-15: tsh 0.12; free T4: 2.1; free T3: 3.4  01-03-16: free T3: 2.4; free T4: 1.26  05-02-16: wbc 5.9; hgb 11.0; hct 33.6; mcv 97.7; plt 143 glucose 153 bun 37; creat 1.10; k+ 3.4 na++ 142  05-06-16: glucose 92; bun 24.0; creat 0.81; k+ 4.1; na++ 143; liver normal albumin 3.2; tsh 0.20; free T 4: 1.50      Review of Systems Unable to perform ROS: Dementia      Physical Exam Constitutional: No distress.  Eyes: Conjunctivae are normal.  Neck: Neck supple. No JVD present. No thyromegaly present thyroid has grainy and "hard"   Cardiovascular: Normal rate, regular rhythm and intact distal pulses.   Respiratory: Effort normal and breath sounds normal. No respiratory distress. She has no wheezes.  GI: Soft. Bowel sounds are normal. She exhibits no distension. There is no tenderness.  Musculoskeletal: She exhibits edema.  Able to move all extremities Has left hand contracture has splint   1+ lower extremity edema   Lymphadenopathy:    She has no cervical adenopathy.  Neurological: She is alert.  Skin: Skin is warm and dry. She is not diaphoretic.  Psychiatric: She has a normal mood and affect.      ASSESSMENT/ PLAN:    1.  Congestive heart failure : is stable will continue demadex 20 mg twice  daily with k+ 20 meq three times daily and will monitor her status   2. Hypothyroidism: tsh 0.22; free T4: 1.50; will lower synthroid  to 125 mcg daily and repeat thyroid labs in 6 weeks. .   3.  Hypertension: is presently not on medications; will not make changes will  Monitor  4. Constipation: will continue lactulose 30 gm daily and  miralax daily   5. Vascular dementia: she is slowly losing weight; she has received appetite stimulants without long term affect; her current weight is 128 pounds .  is presently not on medications;  Will not make changes and will monitor   6. Depression: does receive benefit from cymbalta 30 mg daily   7. Schizophrenia: is without change in her status. Is not on antipsychotic medications will monitor   8. Weight loss/ FTT: her current weight is 128 pounds June 2017 her weight was 139 pounds . The staff continues to give out her snuff to her.   She has completed her marinol therapy on 08-17-15. An unfortunate outcome of late stage of dementia is weight loss. Will continue supplements per facility protocol; will monitor     Synthia Innocent NP [facility]  Contact [phone] Monday through Friday 8am- 5pm  After hours call [phone]

## 2016-07-02 ENCOUNTER — Encounter: Payer: Self-pay | Admitting: Adult Health

## 2016-07-02 ENCOUNTER — Non-Acute Institutional Stay (SKILLED_NURSING_FACILITY): Payer: Medicare Other | Admitting: Adult Health

## 2016-07-02 DIAGNOSIS — R627 Adult failure to thrive: Secondary | ICD-10-CM | POA: Diagnosis not present

## 2016-07-02 DIAGNOSIS — I509 Heart failure, unspecified: Secondary | ICD-10-CM | POA: Diagnosis not present

## 2016-07-02 DIAGNOSIS — E034 Atrophy of thyroid (acquired): Secondary | ICD-10-CM

## 2016-07-02 DIAGNOSIS — F01518 Vascular dementia, unspecified severity, with other behavioral disturbance: Secondary | ICD-10-CM

## 2016-07-02 DIAGNOSIS — F0151 Vascular dementia with behavioral disturbance: Secondary | ICD-10-CM

## 2016-07-02 DIAGNOSIS — I119 Hypertensive heart disease without heart failure: Secondary | ICD-10-CM | POA: Diagnosis not present

## 2016-07-02 DIAGNOSIS — F209 Schizophrenia, unspecified: Secondary | ICD-10-CM

## 2016-07-02 DIAGNOSIS — F329 Major depressive disorder, single episode, unspecified: Secondary | ICD-10-CM

## 2016-07-02 DIAGNOSIS — F0280 Dementia in other diseases classified elsewhere without behavioral disturbance: Secondary | ICD-10-CM

## 2016-07-02 DIAGNOSIS — F0393 Unspecified dementia, unspecified severity, with mood disturbance: Secondary | ICD-10-CM

## 2016-07-02 NOTE — Progress Notes (Signed)
Location:   [facility] Room Number: 135 A Place of Service:  SNF (31)   CODE STATUS: Full Code  Allergies  Allergen Reactions  . Erythromycin     Per MAR  . Penicillins     Per MAR  . Sulfa Antibiotics     Per [facility]    Chief Complaint  Patient presents with  . Medical Management of Chronic Issues    1 month follow up    HPI:  She is a long term resident of this facility being seen for the management of her chronic illnesses. Overall there is little change in her status. She is unable to participate in the hpi or ros. There are no nursing concerns at this time.    Past Medical History:  Diagnosis Date  . Anemia   . CHF (congestive heart failure) (HCC)   . Depression   . Hyperlipidemia   . Hypertension   . Insomnia 08/11/2013  . MYALGIA 07/13/2007   Qualifier: Diagnosis of  By: Gavin Potters MD, HEIDI    . SCHIZOPHRENIA 04/29/2006   Qualifier: Diagnosis of  By: Erenest Rasher MD, MADELEINE    . Thyroid disease   . TREMOR, ESSENTIAL 05/28/2006   Qualifier: Diagnosis of  By: Erenest Rasher MD, MADELEINE    . Vascular dementia     History reviewed. No pertinent surgical history.  Social History   Social History  . Marital status: Widowed    Spouse name: N/A  . Number of children: N/A  . Years of education: N/A   Occupational History  . Not on file.   Social History Main Topics  . Smoking status: Former Games developer  . Smokeless tobacco: Current User    Types: Snuff  . Alcohol use No  . Drug use: No  . Sexual activity: No   Other Topics Concern  . Not on file   Social History Narrative  . No narrative on file   Family History  Problem Relation Age of Onset  . Family history unknown: Yes      VITAL SIGNS BP 130/88   Pulse 88   Temp 98.6 F (37 C)   Resp 18   Ht 5\' 1"  (1.549 m)   Wt 132 lb 12.8 oz (60.2 kg)   SpO2 97%   BMI 25.09 kg/m   Patient's Medications  New Prescriptions   No medications on file  Previous Medications   ACETAMINOPHEN  (TYLENOL) 500 MG TABLET    Take 500 mg by mouth every 6 (six) hours as needed.   CETIRIZINE (ZYRTEC) 5 MG TABLET    Take 5 mg by mouth as needed for allergies.   CYANOCOBALAMIN 500 MCG TABLET    Take 500 mcg by mouth daily. For B-12 deficiency   DULOXETINE (CYMBALTA) 30 MG CAPSULE    Take 30 mg by mouth daily. For anxiety   GUAIFENESIN (ROBITUSSIN) 100 MG/5ML LIQUID    Take 200 mg by mouth every 6 (six) hours as needed for cough. Reported on 06/12/2015   LACTULOSE (CHRONULAC) 10 GM/15ML SOLUTION    Take 30 g by mouth every morning.    LEVOTHYROXINE (SYNTHROID, LEVOTHROID) 125 MCG TABLET    Take 125 mcg by mouth daily before breakfast.   MAGNESIUM HYDROXIDE (MILK OF MAGNESIA) 400 MG/5ML SUSPENSION    Take 30 mLs by mouth daily as needed for mild constipation.   MULTIPLE VITAMINS-MINERALS (MULTIVITAMIN WITH MINERALS) TABLET    Take 1 tablet by mouth daily.   OLANZAPINE (ZYPREXA) 2.5 MG TABLET  Take 2.5 mg by mouth at bedtime.   POLYETHYLENE GLYCOL POWDER (MIRALAX) POWDER    Take 17 g by mouth daily. For constipation. Dispense 1 month supply.   POLYVINYL ALCOHOL-POVIDONE (FRESHKOTE) 2.7-2 % SOLN    Instill 1 drop in both eyes three times a day   POTASSIUM CHLORIDE SA (KLOR-CON M20) 20 MEQ TABLET    Take 20 mEq by mouth 2 (two) times daily.   SKIN PROTECTANTS, MISC. (CALAZIME SKIN PROTECTANT EX)    Apply to bilateral buttocks and coccyx area 2 times daily and as needed   TORSEMIDE (DEMADEX) 20 MG TABLET    Take 20 mg by mouth 2 (two) times daily.   UNABLE TO FIND    Med Pass 120 cc by mouth 3 times daily  Modified Medications   No medications on file  Discontinued Medications   LEVOTHYROXINE (SYNTHROID, LEVOTHROID) 150 MCG TABLET    Take 150 mcg by mouth daily before breakfast.     SIGNIFICANT DIAGNOSTIC EXAMS  04-16-14: ct of head: 1. No acute intracranial findings. 2. Severe volume loss and chronic microvascular ischemic changes 3. Left maxillary sinus disease, likely chronic.  05-07-16: chest  x-ray: 1. No active cardiopulmonary disease. No evidence of pneumonia or pulmonary edema. 2. Aortic atherosclerosis.  05-13-16: right hand; No significant abnormality seen  LABS REVIEWED:    06-21-15: wbc 4.6; hgb 11.7; hct 34.3; mcv 96.1 plt 131; glucose 81; bun 19; creat 1.02; k+ 3.3; na++143; liver normal albumin 2.9 07-03-15: wbc 5.4; hgb 13.0; hct 38.0; mcv 96.0; plt 131; glucose 94; bun 17; creat 0.98; k+ 3.6; na++143; liver normal albumin 3.1; tsh 20.45  07-26-15: tsh 9.86; free T4; 0.9; free T3: 1.8  08-28-15: tsh 7.24; free T4 1.1; free T3: 1.8 10-21-15: tsh 78.26; free T4: 0.8; free T3: 1.4  10-25-15: glucose 90; bun 25; creat 1.11; k+ 3.7; na++ 143  12-04-15: tsh 0.12; free T4: 2.1; free T3: 3.4  01-03-16: free T3: 2.4; free T4: 1.26  05-02-16: wbc 5.9; hgb 11.0; hct 33.6; mcv 97.7; plt 143 glucose 153 bun 37; creat 1.10; k+ 3.4 na++ 142  05-06-16: glucose 92; bun 24.0; creat 0.81; k+ 4.1; na++ 143; liver normal albumin 3.2; tsh 0.20; free T 4: 1.50      Review of Systems Unable to perform ROS: Dementia      Physical Exam Constitutional: No distress.  Eyes: Conjunctivae are normal.  Neck: Neck supple. No JVD present. No thyromegaly present thyroid has grainy and "hard"   Cardiovascular: Normal rate, regular rhythm and intact distal pulses.   Respiratory: Effort normal and breath sounds normal. No respiratory distress. She has no wheezes.  GI: Soft. Bowel sounds are normal. She exhibits no distension. There is no tenderness.  Musculoskeletal: She exhibits edema.  Able to move all extremities Has left hand contracture has splint   1+ lower extremity edema   Lymphadenopathy:    She has no cervical adenopathy.  Neurological: She is alert.  Skin: Skin is warm and dry. She is not diaphoretic.  Psychiatric: She has a normal mood and affect.      ASSESSMENT/ PLAN:    1.  Congestive heart failure : is stable will continue demadex 20 mg twice  daily with k+ 20 meq three times  daily and will monitor her status   2. Hypothyroidism: tsh 0.22; free T4: 1.50; will continue 125 mcg daily   3.  Hypertension: b/p 130/88 is presently not on medications; will not make changes will  Monitor  4. Constipation: will continue lactulose 30 gm daily and miralax daily   5. Vascular dementia: she is slowly losing weight; she has received appetite stimulants without long term affect; her current weight is 132 pounds .  is presently not on medications;  Will not make changes and will monitor   6. Depression: does receive benefit from cymbalta 30 mg daily   7. Schizophrenia: is without change in her status. Is not on antipsychotic medications will monitor   8. Weight loss/ FTT: her current weight is 132 pounds June 2017 her weight was 139 pounds . The staff continues to give out her snuff to her.   She has completed her marinol therapy on 08-17-15. An unfortunate outcome of late stage of dementia is weight loss. Will continue supplements per facility protocol; will monitor     Synthia Innocenteborah Joie Hipps NP [facility]  Contact [phone] Monday through Friday 8am- 5pm  After hours call [phone]

## 2016-08-03 ENCOUNTER — Encounter: Payer: Self-pay | Admitting: Adult Health

## 2016-08-03 ENCOUNTER — Non-Acute Institutional Stay (SKILLED_NURSING_FACILITY): Payer: Medicare Other | Admitting: Adult Health

## 2016-08-03 DIAGNOSIS — I119 Hypertensive heart disease without heart failure: Secondary | ICD-10-CM

## 2016-08-03 DIAGNOSIS — R627 Adult failure to thrive: Secondary | ICD-10-CM

## 2016-08-03 DIAGNOSIS — I509 Heart failure, unspecified: Secondary | ICD-10-CM | POA: Diagnosis not present

## 2016-08-03 DIAGNOSIS — F0280 Dementia in other diseases classified elsewhere without behavioral disturbance: Secondary | ICD-10-CM

## 2016-08-03 DIAGNOSIS — F329 Major depressive disorder, single episode, unspecified: Secondary | ICD-10-CM

## 2016-08-03 DIAGNOSIS — F01518 Vascular dementia, unspecified severity, with other behavioral disturbance: Secondary | ICD-10-CM

## 2016-08-03 DIAGNOSIS — F0151 Vascular dementia with behavioral disturbance: Secondary | ICD-10-CM

## 2016-08-03 DIAGNOSIS — F0393 Unspecified dementia, unspecified severity, with mood disturbance: Secondary | ICD-10-CM

## 2016-08-03 DIAGNOSIS — F209 Schizophrenia, unspecified: Secondary | ICD-10-CM

## 2016-08-03 NOTE — Progress Notes (Signed)
Location:   [facility] Room Number: 135 A Place of Service:  SNF (31)   CODE STATUS: Full Code  Allergies  Allergen Reactions  . Erythromycin     Per MAR  . Penicillins     Per MAR  . Sulfa Antibiotics     Per [facility]    Chief Complaint  Patient presents with  . Medical Management of Chronic Issues    1 month follow up    HPI:  She is a long term resident of this facility being for the management of her chronic illnesses. Overall her status is without significant change. She is unable to participate in the hpi or ros. There are no nursing concerns at this time.    Past Medical History:  Diagnosis Date  . Anemia   . CHF (congestive heart failure) (HCC)   . Depression   . Hyperlipidemia   . Hypertension   . Insomnia 08/11/2013  . MYALGIA 07/13/2007   Qualifier: Diagnosis of  By: Gavin Potters MD, HEIDI    . SCHIZOPHRENIA 04/29/2006   Qualifier: Diagnosis of  By: Erenest Rasher MD, MADELEINE    . Thyroid disease   . TREMOR, ESSENTIAL 05/28/2006   Qualifier: Diagnosis of  By: Erenest Rasher MD, MADELEINE    . Vascular dementia     History reviewed. No pertinent surgical history.  Social History   Social History  . Marital status: Widowed    Spouse name: N/A  . Number of children: N/A  . Years of education: N/A   Occupational History  . Not on file.   Social History Main Topics  . Smoking status: Former Games developer  . Smokeless tobacco: Current User    Types: Snuff  . Alcohol use No  . Drug use: No  . Sexual activity: No   Other Topics Concern  . Not on file   Social History Narrative  . No narrative on file   Family History  Problem Relation Age of Onset  . Family history unknown: Yes      VITAL SIGNS BP 105/67   Pulse 93   Temp 97 F (36.1 C)   Resp 16   Ht 5\' 1"  (1.549 m)   Wt 132 lb 13 oz (60.2 kg)   SpO2 96%   BMI 25.09 kg/m   Patient's Medications  New Prescriptions   No medications on file  Previous Medications   ACETAMINOPHEN  (TYLENOL) 500 MG TABLET    Take 500 mg by mouth every 6 (six) hours as needed.   CETIRIZINE (ZYRTEC) 5 MG TABLET    Take 5 mg by mouth as needed for allergies.   CYANOCOBALAMIN 500 MCG TABLET    Take 500 mcg by mouth daily. For B-12 deficiency   DULOXETINE (CYMBALTA) 30 MG CAPSULE    Take 30 mg by mouth daily. For anxiety   GUAIFENESIN (ROBITUSSIN) 100 MG/5ML LIQUID    Take 200 mg by mouth every 6 (six) hours as needed for cough. Reported on 06/12/2015   LACTULOSE (CHRONULAC) 10 GM/15ML SOLUTION    Take 30 g by mouth every morning.    LEVOTHYROXINE (SYNTHROID, LEVOTHROID) 125 MCG TABLET    Take 125 mcg by mouth daily before breakfast.   MAGNESIUM HYDROXIDE (MILK OF MAGNESIA) 400 MG/5ML SUSPENSION    Take 30 mLs by mouth daily as needed for mild constipation.   MULTIPLE VITAMINS-MINERALS (MULTIVITAMIN WITH MINERALS) TABLET    Take 1 tablet by mouth daily.   OLANZAPINE (ZYPREXA) 5 MG TABLET  Take 5 mg by mouth at bedtime.    POLYETHYLENE GLYCOL POWDER (MIRALAX) POWDER    Take 17 g by mouth daily. For constipation. Dispense 1 month supply.   POLYVINYL ALCOHOL-POVIDONE (FRESHKOTE) 2.7-2 % SOLN    Instill 1 drop in both eyes three times a day   POTASSIUM CHLORIDE SA (KLOR-CON M20) 20 MEQ TABLET    Take 20 mEq by mouth 2 (two) times daily.   SKIN PROTECTANTS, MISC. (CALAZIME SKIN PROTECTANT EX)    Apply to bilateral buttocks and coccyx area 2 times daily and as needed   TORSEMIDE (DEMADEX) 20 MG TABLET    Take 20 mg by mouth 2 (two) times daily.   UNABLE TO FIND    Med Pass 120 cc by mouth 3 times daily  Modified Medications   No medications on file  Discontinued Medications   No medications on file     SIGNIFICANT DIAGNOSTIC EXAMS  04-16-14: ct of head: 1. No acute intracranial findings. 2. Severe volume loss and chronic microvascular ischemic changes 3. Left maxillary sinus disease, likely chronic.  05-07-16: chest x-ray: 1. No active cardiopulmonary disease. No evidence of pneumonia or  pulmonary edema. 2. Aortic atherosclerosis.  05-13-16: right hand; No significant abnormality seen  LABS REVIEWED:    08-28-15: tsh 7.24; free T4 1.1; free T3: 1.8 10-21-15: tsh 78.26; free T4: 0.8; free T3: 1.4  10-25-15: glucose 90; bun 25; creat 1.11; k+ 3.7; na++ 143  12-04-15: tsh 0.12; free T4: 2.1; free T3: 3.4  01-03-16: free T3: 2.4; free T4: 1.26  05-02-16: wbc 5.9; hgb 11.0; hct 33.6; mcv 97.7; plt 143 glucose 153 bun 37; creat 1.10; k+ 3.4 na++ 142  05-06-16: glucose 92; bun 24.0; creat 0.81; k+ 4.1; na++ 143; liver normal albumin 3.2; tsh 0.20; free T 4: 1.50  06-12-16: urine culture: e-coli: cipro     Review of Systems Unable to perform ROS: Dementia      Physical Exam Constitutional: No distress.  Eyes: Conjunctivae are normal.  Neck: Neck supple. No JVD present. No thyromegaly present thyroid has grainy and "hard"   Cardiovascular: Normal rate, regular rhythm and intact distal pulses.   Respiratory: Effort normal and breath sounds normal. No respiratory distress. She has no wheezes.  GI: Soft. Bowel sounds are normal. She exhibits no distension. There is no tenderness.  Musculoskeletal: She exhibits edema.  Able to move all extremities Has left hand contracture has splint   1+ lower extremity edema   Lymphadenopathy:    She has no cervical adenopathy.  Neurological: She is alert.  Skin: Skin is warm and dry. She is not diaphoretic.  Psychiatric: She has a normal mood and affect.      ASSESSMENT/ PLAN:   1.  Congestive heart failure : is stable will continue demadex 20 mg twice  daily with k+ 20 meq twice daily and will monitor her status   2. Hypothyroidism: tsh 0.20; free T4: 1.50; will continue 125 mcg daily   3.  Hypertension: b/p 105/67  is presently not on medications; will not make changes will  Monitor  4. Constipation: will continue lactulose 30 gm daily and miralax daily   5. Vascular dementia: she is slowly losing weight; she has received appetite  stimulants without long term affect; her current weight is 132 pounds .  is presently not on medications;  Will not make changes and will monitor   6. Depression: does receive benefit from cymbalta 30 mg daily   7. Schizophrenia: is  without change in her status. Is not on antipsychotic medications will monitor   8. Weight loss/ FTT: her current weight is 132 pounds June 2017 her weight was 139 pounds . The staff continues to give out her snuff to her.   She has completed her marinol therapy on 08-17-15. An unfortunate outcome of late stage of dementia is weight loss. Will continue supplements per facility protocol; will monitor    MD is aware of resident's narcotic use and is in agreement with current plan of care. We will attempt to wean resident as apropriate     Synthia Innocent NP [facility]  Contact [phone] Monday through Friday 8am- 5pm  After hours call [phone]

## 2016-08-06 ENCOUNTER — Non-Acute Institutional Stay (SKILLED_NURSING_FACILITY): Payer: Medicare Other

## 2016-08-06 DIAGNOSIS — Z0000 Encounter for general adult medical examination without abnormal findings: Secondary | ICD-10-CM | POA: Diagnosis not present

## 2016-08-06 NOTE — Patient Instructions (Signed)
Melanie Roberson , Thank you for taking time to come for your Medicare Wellness Visit. I appreciate your ongoing commitment to your health goals. Please review the following plan we discussed and let me know if I can assist you in the future.   Screening recommendations/referrals: Colonoscopy long term pt Mammogram long term pt Bone Density up to date Recommended yearly ophthalmology/optometry visit for glaucoma screening and checkup Recommended yearly dental visit for hygiene and checkup  Vaccinations: Influenza vaccine due when available Pneumococcal vaccine up to date Tdap vaccine due 11/16/17 Shingles vaccine not in records  Advanced directives: Need a copy for chart  Conditions/risks identified: None  Next appointment: none upcoming   Preventive Care 65 Years and Older, Female Preventive care refers to lifestyle choices and visits with your health care provider that can promote health and wellness. What does preventive care include?  A yearly physical exam. This is also called an annual well check.  Dental exams once or twice a year.  Routine eye exams. Ask your health care provider how often you should have your eyes checked.  Personal lifestyle choices, including:  Daily care of your teeth and gums.  Regular physical activity.  Eating a healthy diet.  Avoiding tobacco and drug use.  Limiting alcohol use.  Practicing safe sex.  Taking low-dose aspirin every day.  Taking vitamin and mineral supplements as recommended by your health care provider. What happens during an annual well check? The services and screenings done by your health care provider during your annual well check will depend on your age, overall health, lifestyle risk factors, and family history of disease. Counseling  Your health care provider may ask you questions about your:  Alcohol use.  Tobacco use.  Drug use.  Emotional well-being.  Home and relationship well-being.  Sexual  activity.  Eating habits.  History of falls.  Memory and ability to understand (cognition).  Work and work Astronomerenvironment.  Reproductive health. Screening  You may have the following tests or measurements:  Height, weight, and BMI.  Blood pressure.  Lipid and cholesterol levels. These may be checked every 5 years, or more frequently if you are over 81 years old.  Skin check.  Lung cancer screening. You may have this screening every year starting at age 855 if you have a 30-pack-year history of smoking and currently smoke or have quit within the past 15 years.  Fecal occult blood test (FOBT) of the stool. You may have this test every year starting at age 450.  Flexible sigmoidoscopy or colonoscopy. You may have a sigmoidoscopy every 5 years or a colonoscopy every 10 years starting at age 81.  Hepatitis C blood test.  Hepatitis B blood test.  Sexually transmitted disease (STD) testing.  Diabetes screening. This is done by checking your blood sugar (glucose) after you have not eaten for a while (fasting). You may have this done every 1-3 years.  Bone density scan. This is done to screen for osteoporosis. You may have this done starting at age 81.  Mammogram. This may be done every 1-2 years. Talk to your health care provider about how often you should have regular mammograms. Talk with your health care provider about your test results, treatment options, and if necessary, the need for more tests. Vaccines  Your health care provider may recommend certain vaccines, such as:  Influenza vaccine. This is recommended every year.  Tetanus, diphtheria, and acellular pertussis (Tdap, Td) vaccine. You may need a Td booster every 10 years.  Zoster vaccine. You may need this after age 64.  Pneumococcal 13-valent conjugate (PCV13) vaccine. One dose is recommended after age 59.  Pneumococcal polysaccharide (PPSV23) vaccine. One dose is recommended after age 55. Talk to your health care  provider about which screenings and vaccines you need and how often you need them. This information is not intended to replace advice given to you by your health care provider. Make sure you discuss any questions you have with your health care provider. Document Released: 03/15/2015 Document Revised: 11/06/2015 Document Reviewed: 12/18/2014 Elsevier Interactive Patient Education  2017 [place] Prevention in the Home Falls can cause injuries. They can happen to people of all ages. There are many things you can do to make your home safe and to help prevent falls. What can I do on the outside of my home?  Regularly fix the edges of walkways and driveways and fix any cracks.  Remove anything that might make you trip as you walk through a door, such as a raised step or threshold.  Trim any bushes or trees on the path to your home.  Use bright outdoor lighting.  Clear any walking paths of anything that might make someone trip, such as rocks or tools.  Regularly check to see if handrails are loose or broken. Make sure that both sides of any steps have handrails.  Any raised decks and porches should have guardrails on the edges.  Have any leaves, snow, or ice cleared regularly.  Use sand or salt on walking paths during winter.  Clean up any spills in your garage right away. This includes oil or grease spills. What can I do in the bathroom?  Use night lights.  Install grab bars by the toilet and in the tub and shower. Do not use towel bars as grab bars.  Use non-skid mats or decals in the tub or shower.  If you need to sit down in the shower, use a plastic, non-slip stool.  Keep the floor dry. Clean up any water that spills on the floor as soon as it happens.  Remove soap buildup in the tub or shower regularly.  Attach bath mats securely with double-sided non-slip rug tape.  Do not have throw rugs and other things on the floor that can make you trip. What can I do in  the bedroom?  Use night lights.  Make sure that you have a light by your bed that is easy to reach.  Do not use any sheets or blankets that are too big for your bed. They should not hang down onto the floor.  Have a firm chair that has side arms. You can use this for support while you get dressed.  Do not have throw rugs and other things on the floor that can make you trip. What can I do in the kitchen?  Clean up any spills right away.  Avoid walking on wet floors.  Keep items that you use a lot in easy-to-reach places.  If you need to reach something above you, use a strong step stool that has a grab bar.  Keep electrical cords out of the way.  Do not use floor polish or wax that makes floors slippery. If you must use wax, use non-skid floor wax.  Do not have throw rugs and other things on the floor that can make you trip. What can I do with my stairs?  Do not leave any items on the stairs.  Make sure that there are  handrails on both sides of the stairs and use them. Fix handrails that are broken or loose. Make sure that handrails are as long as the stairways.  Check any carpeting to make sure that it is firmly attached to the stairs. Fix any carpet that is loose or worn.  Avoid having throw rugs at the top or bottom of the stairs. If you do have throw rugs, attach them to the floor with carpet tape.  Make sure that you have a light switch at the top of the stairs and the bottom of the stairs. If you do not have them, ask someone to add them for you. What else can I do to help prevent falls?  Wear shoes that:  Do not have high heels.  Have rubber bottoms.  Are comfortable and fit you well.  Are closed at the toe. Do not wear sandals.  If you use a stepladder:  Make sure that it is fully opened. Do not climb a closed stepladder.  Make sure that both sides of the stepladder are locked into place.  Ask someone to hold it for you, if possible.  Clearly mark and  make sure that you can see:  Any grab bars or handrails.  First and last steps.  Where the edge of each step is.  Use tools that help you move around (mobility aids) if they are needed. These include:  Canes.  Walkers.  Scooters.  Crutches.  Turn on the lights when you go into a dark area. Replace any light bulbs as soon as they burn out.  Set up your furniture so you have a clear path. Avoid moving your furniture around.  If any of your floors are uneven, fix them.  If there are any pets around you, be aware of where they are.  Review your medicines with your doctor. Some medicines can make you feel dizzy. This can increase your chance of falling. Ask your doctor what other things that you can do to help prevent falls. This information is not intended to replace advice given to you by your health care provider. Make sure you discuss any questions you have with your health care provider. Document Released: 12/13/2008 Document Revised: 07/25/2015 Document Reviewed: 03/23/2014 Elsevier Interactive Patient Education  2017 [employer].

## 2016-08-06 NOTE — Progress Notes (Signed)
Subjective:   Melanie Roberson is a 83 y.o. female who presents for an Initial Medicare Annual Wellness Visit. [facility]; incapacitated and unable to answer questions appropriately      Objective:    Today's Vitals   08/06/16 1111  BP: (!) 110/55  Pulse: 85  Temp: 97.6 F (36.4 C)  TempSrc: Oral  SpO2: (!) 85%  Weight: 133 lb (60.3 kg)  Height: 5\' 1"  (1.549 m)   Body mass index is 25.13 kg/m.   Current Medications (verified) Outpatient Encounter Prescriptions as of 08/06/2016  Medication Sig  . acetaminophen (TYLENOL) 500 MG tablet Take 500 mg by mouth every 6 (six) hours as needed.  . cetirizine (ZYRTEC) 5 MG tablet Take 5 mg by mouth as needed for allergies.  . cyanocobalamin 500 MCG tablet Take 500 mcg by mouth daily. For B-12 deficiency  . DULoxetine (CYMBALTA) 30 MG capsule Take 30 mg by mouth daily. For anxiety  . guaiFENesin (ROBITUSSIN) 100 MG/5ML liquid Take 200 mg by mouth every 6 (six) hours as needed for cough. Reported on 06/12/2015  . lactulose (CHRONULAC) 10 GM/15ML solution Take 30 g by mouth every morning.   Marland Kitchen. levothyroxine (SYNTHROID, LEVOTHROID) 125 MCG tablet Take 125 mcg by mouth daily before breakfast.  . magnesium hydroxide (MILK OF MAGNESIA) 400 MG/5ML suspension Take 30 mLs by mouth daily as needed for mild constipation.  . Multiple Vitamins-Minerals (MULTIVITAMIN WITH MINERALS) tablet Take 1 tablet by mouth daily.  Marland Kitchen. OLANZapine (ZYPREXA) 5 MG tablet Take 5 mg by mouth at bedtime.   . polyethylene glycol powder (MIRALAX) powder Take 17 g by mouth daily. For constipation. Dispense 1 month supply.  . Polyvinyl Alcohol-Povidone (FRESHKOTE) 2.7-2 % SOLN Instill 1 drop in both eyes three times a day  . potassium chloride SA (KLOR-CON M20) 20 MEQ tablet Take 20 mEq by mouth 2 (two) times daily.  . Skin Protectants, Misc. (CALAZIME SKIN PROTECTANT EX) Apply to bilateral buttocks and coccyx area 2 times  daily and as needed  . torsemide (DEMADEX) 20 MG tablet Take 20 mg by mouth 2 (two) times daily.  Marland Kitchen. UNABLE TO FIND Med Pass 120 cc by mouth 3 times daily   No facility-administered encounter medications on file as of 08/06/2016.     Allergies (verified) Erythromycin; Penicillins; and Sulfa antibiotics   History: Past Medical History:  Diagnosis Date  . Anemia   . CHF (congestive heart failure) (HCC)   . Depression   . Hyperlipidemia   . Hypertension   . Insomnia 08/11/2013  . MYALGIA 07/13/2007   Qualifier: Diagnosis of  By: Gavin PottersGRANDIS MD, HEIDI    . SCHIZOPHRENIA 04/29/2006   Qualifier: Diagnosis of  By: Erenest RasherVANSTORY MD, MADELEINE    . Thyroid disease   . TREMOR, ESSENTIAL 05/28/2006   Qualifier: Diagnosis of  By: Erenest RasherVANSTORY MD, MADELEINE    . Vascular dementia    History reviewed. No pertinent surgical history. Family History  Problem Relation Age of Onset  . Family history unknown: Yes   Social History   Occupational History  . Not on file.   Social History Main Topics  . Smoking status: Former Games developermoker  . Smokeless tobacco: Current User    Types: Snuff  . Alcohol use No  . Drug use: No  . Sexual activity: No    Tobacco Counseling Ready to quit: Not Answered Counseling given: Not Answered   Activities of Daily Living In your present state  of health, do you have any difficulty performing the following activities: 08/06/2016  Hearing? N  Vision? N  Difficulty concentrating or making decisions? Y  Walking or climbing stairs? Y  Dressing or bathing? Y  Doing errands, shopping? Y  Preparing Food and eating ? Y  Using the Toilet? Y  In the past six months, have you accidently leaked urine? Y  Do you have problems with loss of bowel control? Y  Managing your Medications? Y  Managing your Finances? Y  Housekeeping or managing your Housekeeping? Y  Some recent data might be hidden    Immunizations and Health Maintenance Immunization History  Administered Date(s)  Administered  . Influenza Whole 01/18/2007, 11/17/2007, 03/03/2009  . Influenza-Unspecified 12/14/2013  . PPD Test 12/23/2009, 10/09/2010, 04/20/2014  . Pneumococcal Conjugate-13 03/03/2009, 12/19/2013  . Pneumococcal Polysaccharide-23 03/02/2006  . Td 11/17/2007   There are no preventive care reminders to display for this patient.  Patient Care Team: Kirt Boys, DO as PCP - General (Internal Medicine) Chilton Si Chong Sicilian, NP as Nurse Practitioner (Geriatric Medicine) [facility]  Indicate any recent Medical Services you may have received from other than [facility] providers in the past year (date may be approximate).     Assessment:   This is a routine wellness examination for Melanie Roberson.   Hearing/Vision screen No exam data present  Dietary issues and exercise activities discussed: Current Exercise Habits: The patient does not participate in regular exercise at present, Exercise limited by: neurologic condition(s)  Goals    None     Depression Screen PHQ 2/9 Scores 08/06/2016  Exception Documentation Medical reason    Fall Risk Fall Risk  08/06/2016  Falls in the past year? No    Cognitive Function: MMSE - Mini Mental State Exam 08/06/2016  Not completed: Unable to complete        Screening Tests Health Maintenance  Topic Date Due  . INFLUENZA VACCINE  05/04/2017 (Originally 09/30/2016)  . TETANUS/TDAP  11/16/2017  . DEXA SCAN  Completed  . PNA vac Low Risk Adult  Completed      Plan:    I have personally reviewed and addressed the Medicare Annual Wellness questionnaire and have noted the following in the patient's chart:  A. Medical and social history B. Use of alcohol, tobacco or illicit drugs  C. Current medications and supplements D. Functional ability and status E.  Nutritional status F.  Physical activity G. Advance directives H. List of other physicians I.  Hospitalizations, surgeries, and ER visits in previous 12  months J.  Vitals K. Screenings to include hearing, vision, cognitive, depression L. Referrals and appointments - none  In addition, I have reviewed and discussed with patient certain preventive protocols, quality metrics, and best practice recommendations. A written personalized care plan for preventive services as well as general preventive health recommendations were provided to patient.  See attached scanned questionnaire for additional information.   Signed,   Annetta Maw, RN Nurse Health Advisor   Quick Notes   Health Maintenance: flu due when available     Abnormal Screen: unable to complete cognitive exam due to dementia     Patient Concerns: None     Nurse Concerns: None

## 2016-08-30 DEATH — deceased

## 2016-09-13 ENCOUNTER — Encounter (HOSPITAL_COMMUNITY): Payer: Self-pay | Admitting: Emergency Medicine

## 2016-09-13 ENCOUNTER — Inpatient Hospital Stay (HOSPITAL_COMMUNITY)
Admission: EM | Admit: 2016-09-13 | Discharge: 2016-09-15 | DRG: 292 | Disposition: A | Discharge status: Skilled Nursing Facility | Payer: Medicare Other | Attending: Internal Medicine | Admitting: Internal Medicine

## 2016-09-13 ENCOUNTER — Inpatient Hospital Stay (HOSPITAL_COMMUNITY): Payer: Medicare Other

## 2016-09-13 ENCOUNTER — Emergency Department (HOSPITAL_COMMUNITY): Payer: Medicare Other

## 2016-09-13 DIAGNOSIS — F17290 Nicotine dependence, other tobacco product, uncomplicated: Secondary | ICD-10-CM | POA: Diagnosis present

## 2016-09-13 DIAGNOSIS — F209 Schizophrenia, unspecified: Secondary | ICD-10-CM | POA: Diagnosis present

## 2016-09-13 DIAGNOSIS — R64 Cachexia: Secondary | ICD-10-CM | POA: Diagnosis present

## 2016-09-13 DIAGNOSIS — R1310 Dysphagia, unspecified: Secondary | ICD-10-CM | POA: Diagnosis present

## 2016-09-13 DIAGNOSIS — N189 Chronic kidney disease, unspecified: Secondary | ICD-10-CM | POA: Diagnosis present

## 2016-09-13 DIAGNOSIS — Z6827 Body mass index (BMI) 27.0-27.9, adult: Secondary | ICD-10-CM

## 2016-09-13 DIAGNOSIS — E034 Atrophy of thyroid (acquired): Secondary | ICD-10-CM | POA: Diagnosis not present

## 2016-09-13 DIAGNOSIS — R609 Edema, unspecified: Secondary | ICD-10-CM

## 2016-09-13 DIAGNOSIS — Z880 Allergy status to penicillin: Secondary | ICD-10-CM | POA: Diagnosis not present

## 2016-09-13 DIAGNOSIS — D519 Vitamin B12 deficiency anemia, unspecified: Secondary | ICD-10-CM | POA: Diagnosis present

## 2016-09-13 DIAGNOSIS — E039 Hypothyroidism, unspecified: Secondary | ICD-10-CM | POA: Diagnosis present

## 2016-09-13 DIAGNOSIS — S80812A Abrasion, left lower leg, initial encounter: Secondary | ICD-10-CM | POA: Diagnosis present

## 2016-09-13 DIAGNOSIS — Z881 Allergy status to other antibiotic agents status: Secondary | ICD-10-CM | POA: Diagnosis not present

## 2016-09-13 DIAGNOSIS — D638 Anemia in other chronic diseases classified elsewhere: Secondary | ICD-10-CM | POA: Diagnosis present

## 2016-09-13 DIAGNOSIS — S80811A Abrasion, right lower leg, initial encounter: Secondary | ICD-10-CM | POA: Diagnosis present

## 2016-09-13 DIAGNOSIS — R1312 Dysphagia, oropharyngeal phase: Secondary | ICD-10-CM | POA: Diagnosis not present

## 2016-09-13 DIAGNOSIS — F0151 Vascular dementia with behavioral disturbance: Secondary | ICD-10-CM | POA: Diagnosis not present

## 2016-09-13 DIAGNOSIS — I110 Hypertensive heart disease with heart failure: Secondary | ICD-10-CM | POA: Diagnosis not present

## 2016-09-13 DIAGNOSIS — N179 Acute kidney failure, unspecified: Secondary | ICD-10-CM | POA: Diagnosis present

## 2016-09-13 DIAGNOSIS — Z79899 Other long term (current) drug therapy: Secondary | ICD-10-CM | POA: Diagnosis not present

## 2016-09-13 DIAGNOSIS — Z66 Do not resuscitate: Secondary | ICD-10-CM

## 2016-09-13 DIAGNOSIS — R531 Weakness: Secondary | ICD-10-CM

## 2016-09-13 DIAGNOSIS — Z23 Encounter for immunization: Secondary | ICD-10-CM

## 2016-09-13 DIAGNOSIS — F329 Major depressive disorder, single episode, unspecified: Secondary | ICD-10-CM | POA: Diagnosis present

## 2016-09-13 DIAGNOSIS — R791 Abnormal coagulation profile: Secondary | ICD-10-CM | POA: Diagnosis present

## 2016-09-13 DIAGNOSIS — R748 Abnormal levels of other serum enzymes: Secondary | ICD-10-CM | POA: Diagnosis not present

## 2016-09-13 DIAGNOSIS — L98499 Non-pressure chronic ulcer of skin of other sites with unspecified severity: Secondary | ICD-10-CM | POA: Diagnosis present

## 2016-09-13 DIAGNOSIS — I5031 Acute diastolic (congestive) heart failure: Secondary | ICD-10-CM | POA: Diagnosis present

## 2016-09-13 DIAGNOSIS — S40811A Abrasion of right upper arm, initial encounter: Secondary | ICD-10-CM | POA: Diagnosis present

## 2016-09-13 DIAGNOSIS — Z882 Allergy status to sulfonamides status: Secondary | ICD-10-CM

## 2016-09-13 DIAGNOSIS — E860 Dehydration: Secondary | ICD-10-CM | POA: Diagnosis not present

## 2016-09-13 DIAGNOSIS — R778 Other specified abnormalities of plasma proteins: Secondary | ICD-10-CM | POA: Diagnosis present

## 2016-09-13 DIAGNOSIS — R634 Abnormal weight loss: Secondary | ICD-10-CM | POA: Diagnosis present

## 2016-09-13 DIAGNOSIS — I360 Nonrheumatic tricuspid (valve) stenosis: Secondary | ICD-10-CM | POA: Diagnosis not present

## 2016-09-13 DIAGNOSIS — R627 Adult failure to thrive: Secondary | ICD-10-CM | POA: Diagnosis not present

## 2016-09-13 DIAGNOSIS — Z515 Encounter for palliative care: Secondary | ICD-10-CM | POA: Diagnosis not present

## 2016-09-13 DIAGNOSIS — R0902 Hypoxemia: Secondary | ICD-10-CM | POA: Diagnosis not present

## 2016-09-13 DIAGNOSIS — R7989 Other specified abnormal findings of blood chemistry: Secondary | ICD-10-CM

## 2016-09-13 DIAGNOSIS — S40812A Abrasion of left upper arm, initial encounter: Secondary | ICD-10-CM | POA: Diagnosis present

## 2016-09-13 DIAGNOSIS — F01518 Vascular dementia, unspecified severity, with other behavioral disturbance: Secondary | ICD-10-CM | POA: Diagnosis present

## 2016-09-13 DIAGNOSIS — E870 Hyperosmolality and hypernatremia: Secondary | ICD-10-CM | POA: Diagnosis present

## 2016-09-13 LAB — CBC WITH DIFFERENTIAL/PLATELET
BASOS PCT: 1 %
Basophils Absolute: 0 10*3/uL (ref 0.0–0.1)
EOS ABS: 0.2 10*3/uL (ref 0.0–0.7)
Eosinophils Relative: 4 %
HEMATOCRIT: 27.3 % — AB (ref 36.0–46.0)
Hemoglobin: 8.1 g/dL — ABNORMAL LOW (ref 12.0–15.0)
Lymphocytes Relative: 14 %
Lymphs Abs: 0.6 10*3/uL — ABNORMAL LOW (ref 0.7–4.0)
MCH: 27.4 pg (ref 26.0–34.0)
MCHC: 29.7 g/dL — AB (ref 30.0–36.0)
MCV: 92.2 fL (ref 78.0–100.0)
MONO ABS: 0.3 10*3/uL (ref 0.1–1.0)
MONOS PCT: 6 %
NEUTROS ABS: 3.1 10*3/uL (ref 1.7–7.7)
Neutrophils Relative %: 75 %
Platelets: 193 10*3/uL (ref 150–400)
RBC: 2.96 MIL/uL — ABNORMAL LOW (ref 3.87–5.11)
RDW: 15.6 % — AB (ref 11.5–15.5)
WBC: 4.1 10*3/uL (ref 4.0–10.5)

## 2016-09-13 LAB — COMPREHENSIVE METABOLIC PANEL
ALBUMIN: 2.8 g/dL — AB (ref 3.5–5.0)
ALT: 20 U/L (ref 14–54)
AST: 29 U/L (ref 15–41)
Alkaline Phosphatase: 100 U/L (ref 38–126)
Anion gap: 10 (ref 5–15)
BUN: 44 mg/dL — ABNORMAL HIGH (ref 6–20)
CALCIUM: 9 mg/dL (ref 8.9–10.3)
CO2: 27 mmol/L (ref 22–32)
CREATININE: 1.33 mg/dL — AB (ref 0.44–1.00)
Chloride: 112 mmol/L — ABNORMAL HIGH (ref 101–111)
GFR calc Af Amer: 39 mL/min — ABNORMAL LOW (ref >60)
GFR calc non Af Amer: 34 mL/min — ABNORMAL LOW (ref >60)
GLUCOSE: 96 mg/dL (ref 65–99)
Potassium: 4.3 mmol/L (ref 3.5–5.1)
SODIUM: 149 mmol/L — AB (ref 135–145)
Total Bilirubin: 0.6 mg/dL (ref 0.3–1.2)
Total Protein: 6.1 g/dL — ABNORMAL LOW (ref 6.5–8.1)

## 2016-09-13 LAB — URINALYSIS, ROUTINE W REFLEX MICROSCOPIC
BILIRUBIN URINE: NEGATIVE
Glucose, UA: NEGATIVE mg/dL
KETONES UR: NEGATIVE mg/dL
LEUKOCYTES UA: NEGATIVE
Nitrite: NEGATIVE
PROTEIN: 30 mg/dL — AB
SPECIFIC GRAVITY, URINE: 1.015 (ref 1.005–1.030)
pH: 5 (ref 5.0–8.0)

## 2016-09-13 LAB — D-DIMER, QUANTITATIVE (NOT AT ARMC): D DIMER QUANT: 2.4 ug{FEU}/mL — AB (ref 0.00–0.50)

## 2016-09-13 LAB — I-STAT TROPONIN, ED: Troponin i, poc: 0.72 ng/mL (ref 0.00–0.08)

## 2016-09-13 LAB — POC OCCULT BLOOD, ED: Fecal Occult Bld: NEGATIVE

## 2016-09-13 MED ORDER — ADULT MULTIVITAMIN W/MINERALS CH
1.0000 | ORAL_TABLET | Freq: Every day | ORAL | Status: DC
  Administered 2016-09-14: 1 via ORAL
  Filled 2016-09-13: qty 1

## 2016-09-13 MED ORDER — HEPARIN SODIUM (PORCINE) 5000 UNIT/ML IJ SOLN
5000.0000 [IU] | Freq: Three times a day (TID) | INTRAMUSCULAR | Status: DC
  Administered 2016-09-13 – 2016-09-14 (×3): 5000 [IU] via SUBCUTANEOUS
  Filled 2016-09-13 (×3): qty 1

## 2016-09-13 MED ORDER — SODIUM CHLORIDE 0.9% FLUSH: 3.0000 mL | INTRAVENOUS | Status: DC | PRN

## 2016-09-13 MED ORDER — VITAMIN B-12 100 MCG PO TABS
500.0000 ug | ORAL_TABLET | Freq: Every day | ORAL | Status: DC
  Administered 2016-09-14: 500 ug via ORAL
  Filled 2016-09-13: qty 5

## 2016-09-13 MED ORDER — ALBUTEROL SULFATE (2.5 MG/3ML) 0.083% IN NEBU
2.5000 mg | INHALATION_SOLUTION | RESPIRATORY_TRACT | Status: DC | PRN
  Administered 2016-09-14: 2.5 mg via RESPIRATORY_TRACT
  Filled 2016-09-13: qty 3

## 2016-09-13 MED ORDER — ASPIRIN EC 81 MG PO TBEC
81.0000 mg | DELAYED_RELEASE_TABLET | Freq: Every day | ORAL | Status: DC
  Administered 2016-09-14: 81 mg via ORAL
  Filled 2016-09-13: qty 1

## 2016-09-13 MED ORDER — SODIUM CHLORIDE 0.9% FLUSH
3.0000 mL | Freq: Two times a day (BID) | INTRAVENOUS | Status: DC
  Administered 2016-09-13 – 2016-09-14 (×3): 3 mL via INTRAVENOUS

## 2016-09-13 MED ORDER — ACETAMINOPHEN 650 MG RE SUPP: 650.0000 mg | Freq: Four times a day (QID) | RECTAL | Status: DC | PRN

## 2016-09-13 MED ORDER — SODIUM CHLORIDE 0.9% FLUSH
3.0000 mL | Freq: Two times a day (BID) | INTRAVENOUS | Status: DC
  Administered 2016-09-13 – 2016-09-14 (×2): 3 mL via INTRAVENOUS

## 2016-09-13 MED ORDER — ONDANSETRON HCL 4 MG PO TABS: 4.0000 mg | ORAL_TABLET | Freq: Four times a day (QID) | ORAL | Status: DC | PRN

## 2016-09-13 MED ORDER — TETANUS-DIPHTH-ACELL PERTUSSIS 5-2.5-18.5 LF-MCG/0.5 IM SUSP
0.5000 mL | Freq: Once | INTRAMUSCULAR | Status: AC
  Administered 2016-09-13: 0.5 mL via INTRAMUSCULAR
  Filled 2016-09-13: qty 0.5

## 2016-09-13 MED ORDER — SODIUM CHLORIDE 0.9 % IV SOLN: 250.0000 mL | INTRAVENOUS | Status: DC | PRN

## 2016-09-13 MED ORDER — ONDANSETRON HCL 4 MG/2ML IJ SOLN: 4.0000 mg | Freq: Four times a day (QID) | INTRAMUSCULAR | Status: DC | PRN

## 2016-09-13 MED ORDER — SODIUM CHLORIDE 0.9 % IV BOLUS (SEPSIS)
500.0000 mL | Freq: Once | INTRAVENOUS | Status: AC
  Administered 2016-09-13: 500 mL via INTRAVENOUS

## 2016-09-13 MED ORDER — DULOXETINE HCL 30 MG PO CPEP
30.0000 mg | ORAL_CAPSULE | Freq: Every day | ORAL | Status: DC
  Administered 2016-09-14 – 2016-09-15 (×2): 30 mg via ORAL
  Filled 2016-09-13 (×2): qty 1

## 2016-09-13 MED ORDER — LEVOTHYROXINE SODIUM 25 MCG PO TABS
125.0000 ug | ORAL_TABLET | Freq: Every day | ORAL | Status: DC
  Administered 2016-09-14 – 2016-09-15 (×2): 125 ug via ORAL
  Filled 2016-09-13 (×2): qty 1

## 2016-09-13 MED ORDER — OLANZAPINE 5 MG PO TABS
5.0000 mg | ORAL_TABLET | Freq: Every day | ORAL | Status: DC
  Administered 2016-09-13 – 2016-09-14 (×2): 5 mg via ORAL
  Filled 2016-09-13 (×2): qty 1

## 2016-09-13 MED ORDER — ACETAMINOPHEN 325 MG PO TABS: 650.0000 mg | ORAL_TABLET | Freq: Four times a day (QID) | ORAL | Status: DC | PRN

## 2016-09-13 MED ORDER — HYDRALAZINE HCL 20 MG/ML IJ SOLN
10.0000 mg | Freq: Four times a day (QID) | INTRAMUSCULAR | Status: DC | PRN
  Administered 2016-09-13: 10 mg via INTRAVENOUS
  Filled 2016-09-13: qty 1

## 2016-09-13 MED ORDER — IOPAMIDOL (ISOVUE-370) INJECTION 76%
INTRAVENOUS | Status: AC
  Administered 2016-09-13: 100 mL via INTRAVENOUS
  Filled 2016-09-13: qty 100

## 2016-09-13 NOTE — H&P (Signed)
[facility] History and Physical  Melanie MorrisonHelen M Eves ZOX:[id number] DOB: 21-Oct-1923 DOA: 09/13/2016   PCP: Kirt Boysarter, Monica, DO  Specialists: None  Chief Complaint: Lethargy, hypoxia  HPI: Melanie PiperHelen M Chisholm is a 83 y.o. female with a past medical history of vascular dementia, unspecified congestive heart failure, hypothyroidism, schizophrenia, essential hypertension, depression, who lives in a skilled nursing facility and based on notes has been losing weight for the past many months. Her dementia is thought to be moderate to advanced. She was sent over from the skilled nursing facility due to increasing lethargy over the last 24 hours and apparently she was also noted to be hypoxic with sats in the 70s. Due to patient's dementia she is unable to provide any history. According to the nursing staff in the emergency department she was never in any distress. She has been lying comfortably on the bed. She has not cooperated with examination. She has multiple abrasions on her skin which are due to scratching.  In the emergency department, patient was found to have a mildly elevated troponin, elevated creatinine compared to baseline, elevated d-dimer and chest x-ray that shows cardiomegaly, worse than before.  Home Medications: Prior to Admission medications   Medication Sig Start Date End Date Taking? Authorizing Provider  acetaminophen (TYLENOL) 500 MG tablet Take 500 mg by mouth every 6 (six) hours as needed.    [provider]  cetirizine (ZYRTEC) 5 MG tablet Take 5 mg by mouth as needed for allergies.    [provider]  cyanocobalamin 500 MCG tablet Take 500 mcg by mouth daily. For B-12 deficiency    [provider]  DULoxetine (CYMBALTA) 30 MG capsule Take 30 mg by mouth daily. For anxiety    [provider]  guaiFENesin (ROBITUSSIN) 100 MG/5ML liquid Take 200 mg by mouth every 6 (six) hours as needed for cough. Reported on 06/12/2015    [provider]  lactulose (CHRONULAC) 10 GM/15ML solution Take 30 g by mouth every morning.     [provider]  levothyroxine (SYNTHROID, LEVOTHROID) 125 MCG tablet Take 125 mcg by mouth daily before breakfast.    [provider]  magnesium hydroxide (MILK OF MAGNESIA) 400 MG/5ML suspension Take 30 mLs by mouth daily as needed for mild constipation.    [provider]  Multiple Vitamins-Minerals (MULTIVITAMIN WITH MINERALS) tablet Take 1 tablet by mouth daily.    [provider]  OLANZapine (ZYPREXA) 5 MG tablet Take 5 mg by mouth at bedtime.     [provider]  polyethylene glycol powder (MIRALAX) powder Take 17 g by mouth daily. For constipation. Dispense 1 month supply.    [provider]  Polyvinyl Alcohol-Povidone (FRESHKOTE) 2.7-2 % SOLN Instill 1 drop in both eyes three times a day    [provider]  potassium chloride SA (KLOR-CON M20) 20 MEQ tablet Take 20 mEq by mouth 2 (two) times daily.    [provider]  Skin Protectants, Misc. (CALAZIME SKIN PROTECTANT EX) Apply to bilateral buttocks and coccyx area 2 times daily and as needed    [provider]  torsemide (DEMADEX) 20 MG tablet Take 20 mg by mouth 2 (two) times daily.    [provider]  UNABLE TO FIND Med Pass 120 cc by mouth 3 times daily    [provider]    Allergies:  Allergies  Allergen Reactions  . Erythromycin     Per MAR  . Penicillins     Per MAR  .  Sulfa Antibiotics     Per [facility]    Past Medical History: Past Medical History:  Diagnosis Date  . Anemia   . CHF (congestive heart failure) (HCC)   . Depression   . Hyperlipidemia   . Hypertension   . Insomnia 08/11/2013  . MYALGIA 07/13/2007   Qualifier: Diagnosis of  By: Gavin Potters MD, HEIDI    . SCHIZOPHRENIA 04/29/2006   Qualifier: Diagnosis of  By: Erenest Rasher MD, MADELEINE    . Thyroid disease   . TREMOR, ESSENTIAL 05/28/2006   Qualifier: Diagnosis of  By: Erenest Rasher MD,  MADELEINE    . Vascular dementia     History reviewed. No pertinent surgical history.  Social History: Unable to do due to her dementia. Apparently she is nonambulatory.   Family History:  Family History  Problem Relation Age of Onset  . Family history unknown: Yes     Review of Systems - Unable to do due to her dementia  Physical Examination  Vitals:   09/13/16 1500 09/13/16 1617 09/13/16 1630 09/13/16 1700  BP: (!) 168/66 (!) 163/91 (!) 156/84 (!) 171/69  Pulse: 83 89 86 85  Resp: (!) 27 (!) 21 18 13   Temp:      TempSrc:      SpO2: (!) 89% 92% 92% 92%    BP (!) 171/69   Pulse 85   Temp 99 F (37.2 C) (Oral)   Resp 13   SpO2 92%   General appearance: alert, combative and no distress. Not fully cooperative with examination. Head: Normocephalic, without obvious abnormality, atraumatic Eyes: conjunctivae/corneas clear. PERRL, EOM's intact.  Neck: no adenopathy, no carotid bruit, no JVD, supple, symmetrical, trachea midline and thyroid not enlarged, symmetric, no tenderness/mass/nodules Resp: Coarse breath sounds bilaterally but no obvious wheezing or crackles. Cardio: regular rate and rhythm, S1, S2 normal, no murmur, click, rub or gallop GI: soft, non-tender; bowel sounds normal; no masses,  no organomegaly Pulses: 2+ and symmetric Skin: Abrasions noted on the extremities. Shallow skin ulcer noted in the left lower extremity. No surrounding erythema. Lymph nodes: Cervical, supraclavicular, and axillary nodes normal. Neurologic: Awake, alert. Confused and disoriented, moving her extremities.    Labs on Admission: I have personally reviewed following labs and imaging studies  CBC:  Recent Labs Lab 09/13/16 1332  WBC 4.1  NEUTROABS 3.1  HGB 8.1*  HCT 27.3*  MCV 92.2  PLT 193   Basic Metabolic Panel:  Recent Labs Lab 09/13/16 1332  NA 149*  K 4.3  CL 112*  CO2 27  GLUCOSE 96  BUN 44*  CREATININE 1.33*  CALCIUM 9.0   GFR: CrCl cannot be  calculated (Unknown ideal weight.). Liver Function Tests:  Recent Labs Lab 09/13/16 1332  AST 29  ALT 20  ALKPHOS 100  BILITOT 0.6  PROT 6.1*  ALBUMIN 2.8*    Radiological Exams on Admission: Dg Chest Port 1 View  Result Date: 09/13/2016 CLINICAL DATA:  Lethargy. EXAM: PORTABLE CHEST 1 VIEW COMPARISON:  05/02/2016. FINDINGS: Interval borderline enlarged cardiac silhouette with increased prominence of the pulmonary vasculature and interstitial markings and small bilateral pleural effusions. The aorta is calcified. Diffuse osteopenia. IMPRESSION: Interval changes of acute congestive heart failure with borderline cardiomegaly. Electronically Signed   By: Beckie Salts M.D.   On: 09/13/2016 15:31    My interpretation of Electrocardiogram: Sinus rhythm in the 80s. Normal axis. Intervals are normal. Q waves V2 V3. No concerning ST or T-wave changes noted. Compared to EKG from March some of these  changes are new.   Problem List  Active Problems:   Schizophrenia, unspecified type (HCC)   Vascular dementia with behavior disturbance   Hypothyroidism   B12 deficiency anemia   Weight loss   Failure to thrive in adult   Elevated troponin   Hypoxia   Assessment: This is a 83 year old female with a past medical history as stated earlier, who was sent over from her skilled nursing facility due to lethargy and hypoxia. Saturations here have been in the low 90s on room air. Vital signs have been stable except for elevated blood pressure. Patient is confused, presumably due to her dementia. She is noted to have elevated troponin and elevated d-dimer. No information is available from her.  Plan: #1 Lethargy: Patient not lethargic here in the emergency department. She just is confused and disoriented, which is most likely due to her dementia. She does have mildly elevated creatinine compared to her baseline. She also has anemia and hemoglobin is slightly lower compared to her baseline. These  could've contributed to her lethargy. Her UA does not suggest infection. Chest x-ray does not show any pneumonia. She could just be experiencing a decline due to dementia.  #2 Elevated troponin and d-dimer, and hypoxia: Significance of these test results are not entirely clear. Patient unable to verbalize if she has any pain or discomfort, although she does not appear to be in pain at this time. CT scan of the chest has been ordered by the ED provider. Troponins will be trended. Chest x-ray does show worsening cardiomegaly, certainly not be unreasonable to do an echocardiogram. I did try to contact the patient's family to discuss the goals of care. However, I have been unable to reach them. This is an elderly lady with the advanced dementia with the poor functioning at baseline and so not a candidate for aggressive testing. No need for IV heparin at this time. We will check lower extremity Dopplers. We will put her on aspirin.  #3 Cardiomegaly and findings suggestive of congestive heart failure: There is a history of CHF it in her chart, however, it's unclear if this is systolic or diastolic. No previous echocardiogram reports are available. Proceed with echocardiogram at this time. She does not appear to be in any distress. She does have elevated creatinine. She is noted to be on diuretics at home. We will not give her any IV fluids. We will also hold off on diuretics for now. Repeat her labs tomorrow. Wait for echocardiogram.  #4 mild acute renal failure: Baseline creatinine around 1. BUN is also slightly elevated. She was given IV fluids by the ED provider. Hold off on further IV fluids for now. Monitor urine output.  #5 Normocytic anemia: Hemoglobin is slightly lower than her baseline. Her stool was heme-negative. No history of bleeding. Repeat hemoglobin tomorrow. Check anemia panel.  #6 history of advanced dementia and schizophrenia along with depression: Continue her home medications.  #7 history  of hypothyroidism: Continue with Synthroid.  #8 Skin abrasions and ulcer on the left lower extremity: Consult wound care nurse  Patient is nonambulatory at the skilled nursing facility. She has advanced dementia, schizophrenia and has been losing weight. Ideally, this patient should be seen by palliative medicine and a goals of care conversation needs to be occur with family member. Unable to reach family members today. This can be reattempted tomorrow.  DVT Prophylaxis: Subcutaneous heparin Code Status: Records from nursing facility state that she is full code. Family Communication: Unable to reach any  family member  Consults called: None   Severity of Illness: The appropriate patient status for this patient is INPATIENT. Inpatient status is judged to be reasonable and necessary in order to provide the required intensity of service to ensure the patient's safety. The patient's presenting symptoms, physical exam findings, and initial radiographic and laboratory data in the context of their chronic comorbidities is felt to place them at high risk for further clinical deterioration. Furthermore, it is not anticipated that the patient will be medically stable for discharge from the hospital within 2 midnights of admission. The following factors support the patient status of inpatient.   " The patient's presenting symptoms include hypoxia and lethargy. " The worrisome physical exam findings include confusion. " The initial radiographic and laboratory data are worrisome because of elevated troponin, elevated d-dimer. " The chronic co-morbidities include dementia.   * I certify that at the point of admission it is my clinical judgment that the patient will require inpatient hospital care spanning beyond 2 midnights from the point of admission due to high intensity of service, high risk for further deterioration and high frequency of surveillance required.*  Further management decisions will depend on  results of further testing and patient's response to treatment.   [employer]  [facility] Pager [phone]  If 7PM-7AM, please contact night-coverage [email or web address] Password [facility]  09/13/2016, 5:52 PM

## 2016-09-13 NOTE — ED Triage Notes (Signed)
Patient presents today with complaints of Lethargy since last night, Per EMS patient has history of dementia and baseline confused unable to walk and incomprehensible speech. Patient awake and alert on arrival.

## 2016-09-13 NOTE — ED Provider Notes (Signed)
[facility]EMERGENCY DEPT Provider Note   CSN: [id number] Arrival date & time: 09/13/16  1221     History   Chief Complaint Chief Complaint  Patient presents with  . Fatigue    HPI Melanie Roberson is a 83 y.o. female.Level V caveat dementia history is obtained by telephone fromTara Sturdivant who is patient's nurse at [facility] skilled nursing facility. Patient was more "lethargic" today meaning more sleepy she was noted to have a pulse oximetry on room air of 71%. Patient denies any specific complaint. She is noted to have abrasions on all 4 extremities from where she scratches herself with her fingernails.  HPI  Past Medical History:  Diagnosis Date  . Anemia   . CHF (congestive heart failure) (HCC)   . Depression   . Hyperlipidemia   . Hypertension   . Insomnia 08/11/2013  . MYALGIA 07/13/2007   Qualifier: Diagnosis of  By: Gavin Potters MD, HEIDI    . SCHIZOPHRENIA 04/29/2006   Qualifier: Diagnosis of  By: Erenest Rasher MD, MADELEINE    . Thyroid disease   . TREMOR, ESSENTIAL 05/28/2006   Qualifier: Diagnosis of  By: Erenest Rasher MD, MADELEINE    . Vascular dementia     Patient Active Problem List   Diagnosis Date Noted  . Benign hypertensive heart disease without heart failure 11/11/2015  . Weight loss 05/06/2015  . Failure to thrive in adult 05/06/2015  . Hypokalemia 12/01/2013  . B12 deficiency anemia 12/01/2013  . Vascular dementia with behavior disturbance 08/11/2013  . Hypothyroidism 08/11/2013  . Depression due to dementia 06/11/2013  . Congestive heart failure (HCC) 05/19/2012  . Constipation 05/19/2012  . Schizophrenia, unspecified type (HCC) 04/29/2006    History reviewed. No pertinent surgical history.  OB History    No data available       Home Medications    Prior to Admission medications   Medication Sig Start Date End Date Taking? Authorizing Provider  acetaminophen (TYLENOL) 500 MG tablet Take 500 mg by mouth every 6 (six) hours as needed.    [provider]  cetirizine (ZYRTEC) 5 MG tablet Take 5 mg by mouth as needed for allergies.    [provider]  cyanocobalamin 500 MCG tablet Take 500 mcg by mouth daily. For B-12 deficiency    [provider]  DULoxetine (CYMBALTA) 30 MG capsule Take 30 mg by mouth daily. For anxiety    [provider]  guaiFENesin (ROBITUSSIN) 100 MG/5ML liquid Take 200 mg by mouth every 6 (six) hours as needed for cough. Reported on 06/12/2015    [provider]  lactulose (CHRONULAC) 10 GM/15ML solution Take 30 g by mouth every morning.     [provider]  levothyroxine (SYNTHROID, LEVOTHROID) 125 MCG tablet Take 125 mcg by mouth daily before breakfast.    [provider]  magnesium hydroxide (MILK OF MAGNESIA) 400 MG/5ML suspension Take 30 mLs by mouth daily as needed for mild constipation.    [provider]  Multiple Vitamins-Minerals (MULTIVITAMIN WITH MINERALS) tablet Take 1 tablet by mouth daily.    [provider]  OLANZapine (ZYPREXA) 5 MG tablet Take 5 mg by mouth at bedtime.     [provider]  polyethylene glycol powder (MIRALAX) powder Take 17 g by mouth daily. For constipation. Dispense 1 month supply.    [provider]  Polyvinyl Alcohol-Povidone (FRESHKOTE) 2.7-2 % SOLN Instill 1 drop in both eyes three times a day    [provider]  potassium chloride  SA (KLOR-CON M20) 20 MEQ tablet Take 20 mEq by mouth 2 (two) times daily.    [provider]  Skin Protectants, Misc. (CALAZIME SKIN PROTECTANT EX) Apply to bilateral buttocks and coccyx area 2 times daily and as needed    [provider]  torsemide (DEMADEX) 20 MG tablet Take 20 mg by mouth 2 (two) times daily.    [provider]  UNABLE TO FIND Med Pass 120 cc by mouth 3 times daily    [provider]    Family History Family History  Problem Relation Age of Onset  . Family history unknown: Yes    Social  History Social History  Substance Use Topics  . Smoking status: Former Games developer  . Smokeless tobacco: Current User    Types: Snuff  . Alcohol use No     Allergies   Erythromycin; Penicillins; and Sulfa antibiotics   Review of Systems Review of Systems  Unable to perform ROS: Dementia  Skin: Positive for wound.       Abrasions to all 4 extremities  Allergic/Immunologic:       Tetanus immunization unknown     Physical Exam Updated Vital Signs BP (!) 163/78 (BP Location: Right Arm)   Pulse 87   Temp 99 F (37.2 C) (Oral)   Resp 15   SpO2 94%   Physical Exam  Constitutional:  Chronically ill-appearing  HENT:  Head: Normocephalic and atraumatic.  Eyes: Pupils are equal, round, and reactive to light. Conjunctivae are normal.  Neck: Neck supple. No tracheal deviation present. No thyromegaly present.  Cardiovascular: Normal rate and regular rhythm.   No murmur heard. Pulmonary/Chest: Effort normal and breath sounds normal.  Abdominal: Soft. Bowel sounds are normal. She exhibits no distension. There is no tenderness.  Genitourinary: Rectal exam shows guaiac negative stool.  Genitourinary Comments: Rectal normal tone soft brown stool no gross blood  Musculoskeletal: Normal range of motion. She exhibits no edema or tenderness.  Neurological: She is alert. Coordination normal.  Skin: Skin is warm and dry. No rash noted.  Multiple abrasions, healing well 4 extremities without signs of infection  Psychiatric: She has a normal mood and affect.  Nursing note and vitals reviewed.    ED Treatments / Results  Labs (all labs ordered are listed, but only abnormal results are displayed) Labs Reviewed  COMPREHENSIVE METABOLIC PANEL  CBC WITH DIFFERENTIAL/PLATELET  URINALYSIS, ROUTINE W REFLEX MICROSCOPIC  POC OCCULT BLOOD, ED  I-STAT TROPOININ, ED    EKG  EKG Interpretation  Date/Time:  Sunday September 13 2016 14:03:34 EDT Ventricular Rate:  87 PR Interval:    QRS  Duration: 108 QT Interval:  406 QTC Calculation: 489 R Axis:   -62 Text Interpretation:  Sinus rhythm Atrial premature complexes Left axis deviation Anterior infarct, old No significant change since last tracing Confirmed by Doug Sou [phone]) on 09/13/2016 2:11:22 PM      Results for orders placed or performed during the hospital encounter of 09/13/16  Comprehensive metabolic panel  Result Value Ref Range   Sodium 149 (H) 135 - 145 mmol/L   Potassium 4.3 3.5 - 5.1 mmol/L   Chloride 112 (H) 101 - 111 mmol/L   CO2 27 22 - 32 mmol/L   Glucose, Bld 96 65 - 99 mg/dL   BUN 44 (H) 6 - 20 mg/dL   Creatinine, Ser 1.91 (H) 0.44 - 1.00 mg/dL   Calcium 9.0 8.9 - 47.8 mg/dL   Total Protein 6.1 (L) 6.5 - 8.1 g/dL  Albumin 2.8 (L) 3.5 - 5.0 g/dL   AST 29 15 - 41 U/L   ALT 20 14 - 54 U/L   Alkaline Phosphatase 100 38 - 126 U/L   Total Bilirubin 0.6 0.3 - 1.2 mg/dL   GFR calc non Af Amer 34 (L) >60 mL/min   GFR calc Af Amer 39 (L) >60 mL/min   Anion gap 10 5 - 15  CBC with Differential/Platelet  Result Value Ref Range   WBC 4.1 4.0 - 10.5 K/uL   RBC 2.96 (L) 3.87 - 5.11 MIL/uL   Hemoglobin 8.1 (L) 12.0 - 15.0 g/dL   HCT 16.1 (L) 09.6 - 04.5 %   MCV 92.2 78.0 - 100.0 fL   MCH 27.4 26.0 - 34.0 pg   MCHC 29.7 (L) 30.0 - 36.0 g/dL   RDW 40.9 (H) 81.1 - 91.4 %   Platelets 193 150 - 400 K/uL   Neutrophils Relative % 75 %   Neutro Abs 3.1 1.7 - 7.7 K/uL   Lymphocytes Relative 14 %   Lymphs Abs 0.6 (L) 0.7 - 4.0 K/uL   Monocytes Relative 6 %   Monocytes Absolute 0.3 0.1 - 1.0 K/uL   Eosinophils Relative 4 %   Eosinophils Absolute 0.2 0.0 - 0.7 K/uL   Basophils Relative 1 %   Basophils Absolute 0.0 0.0 - 0.1 K/uL  Urinalysis, Routine w reflex microscopic  Result Value Ref Range   Color, Urine YELLOW YELLOW   APPearance HAZY (A) CLEAR   Specific Gravity, Urine 1.015 1.005 - 1.030   pH 5.0 5.0 - 8.0   Glucose, UA NEGATIVE NEGATIVE mg/dL   Hgb urine dipstick LARGE (A) NEGATIVE    Bilirubin Urine NEGATIVE NEGATIVE   Ketones, ur NEGATIVE NEGATIVE mg/dL   Protein, ur 30 (A) NEGATIVE mg/dL   Nitrite NEGATIVE NEGATIVE   Leukocytes, UA NEGATIVE NEGATIVE   RBC / HPF TOO NUMEROUS TO COUNT 0 - 5 RBC/hpf   WBC, UA 6-30 0 - 5 WBC/hpf   Bacteria, UA RARE (A) NONE SEEN   Squamous Epithelial / LPF 0-5 (A) NONE SEEN   Mucous PRESENT   D-dimer, quantitative (not at [facility])  Result Value Ref Range   D-Dimer, Quant 2.40 (H) 0.00 - 0.50 ug/mL-FEU  POC occult blood, ED Provider will collect  Result Value Ref Range   Fecal Occult Bld NEGATIVE NEGATIVE  I-stat troponin, ED  Result Value Ref Range   Troponin i, poc 0.72 (HH) 0.00 - 0.08 ng/mL   Comment NOTIFIED PHYSICIAN    Comment 3           Dg Chest Port 1 View  Result Date: 09/13/2016 CLINICAL DATA:  Lethargy. EXAM: PORTABLE CHEST 1 VIEW COMPARISON:  05/02/2016. FINDINGS: Interval borderline enlarged cardiac silhouette with increased prominence of the pulmonary vasculature and interstitial markings and small bilateral pleural effusions. The aorta is calcified. Diffuse osteopenia. IMPRESSION: Interval changes of acute congestive heart failure with borderline cardiomegaly. Electronically Signed   By: Beckie Salts M.D.   On: 09/13/2016 15:31   Radiology No results found.  Procedures Procedures (including critical care time)  Medications Ordered in ED Medications  Tdap (BOOSTRIX) injection 0.5 mL (not administered)   Chest x-ray viewed by me Results for orders placed or performed during the hospital encounter of 09/13/16  Comprehensive metabolic panel  Result Value Ref Range   Sodium 149 (H) 135 - 145 mmol/L   Potassium 4.3 3.5 - 5.1 mmol/L   Chloride 112 (H) 101 - 111 mmol/L  CO2 27 22 - 32 mmol/L   Glucose, Bld 96 65 - 99 mg/dL   BUN 44 (H) 6 - 20 mg/dL   Creatinine, Ser 4.09 (H) 0.44 - 1.00 mg/dL   Calcium 9.0 8.9 - 81.1 mg/dL   Total Protein 6.1 (L) 6.5 - 8.1 g/dL   Albumin 2.8 (L) 3.5 - 5.0 g/dL   AST 29 15 -  41 U/L   ALT 20 14 - 54 U/L   Alkaline Phosphatase 100 38 - 126 U/L   Total Bilirubin 0.6 0.3 - 1.2 mg/dL   GFR calc non Af Amer 34 (L) >60 mL/min   GFR calc Af Amer 39 (L) >60 mL/min   Anion gap 10 5 - 15  CBC with Differential/Platelet  Result Value Ref Range   WBC 4.1 4.0 - 10.5 K/uL   RBC 2.96 (L) 3.87 - 5.11 MIL/uL   Hemoglobin 8.1 (L) 12.0 - 15.0 g/dL   HCT 91.4 (L) 78.2 - 95.6 %   MCV 92.2 78.0 - 100.0 fL   MCH 27.4 26.0 - 34.0 pg   MCHC 29.7 (L) 30.0 - 36.0 g/dL   RDW 21.3 (H) 08.6 - 57.8 %   Platelets 193 150 - 400 K/uL   Neutrophils Relative % 75 %   Neutro Abs 3.1 1.7 - 7.7 K/uL   Lymphocytes Relative 14 %   Lymphs Abs 0.6 (L) 0.7 - 4.0 K/uL   Monocytes Relative 6 %   Monocytes Absolute 0.3 0.1 - 1.0 K/uL   Eosinophils Relative 4 %   Eosinophils Absolute 0.2 0.0 - 0.7 K/uL   Basophils Relative 1 %   Basophils Absolute 0.0 0.0 - 0.1 K/uL  Urinalysis, Routine w reflex microscopic  Result Value Ref Range   Color, Urine YELLOW YELLOW   APPearance HAZY (A) CLEAR   Specific Gravity, Urine 1.015 1.005 - 1.030   pH 5.0 5.0 - 8.0   Glucose, UA NEGATIVE NEGATIVE mg/dL   Hgb urine dipstick LARGE (A) NEGATIVE   Bilirubin Urine NEGATIVE NEGATIVE   Ketones, ur NEGATIVE NEGATIVE mg/dL   Protein, ur 30 (A) NEGATIVE mg/dL   Nitrite NEGATIVE NEGATIVE   Leukocytes, UA NEGATIVE NEGATIVE   RBC / HPF TOO NUMEROUS TO COUNT 0 - 5 RBC/hpf   WBC, UA 6-30 0 - 5 WBC/hpf   Bacteria, UA RARE (A) NONE SEEN   Squamous Epithelial / LPF 0-5 (A) NONE SEEN   Mucous PRESENT   D-dimer, quantitative (not at [facility])  Result Value Ref Range   D-Dimer, Quant 2.40 (H) 0.00 - 0.50 ug/mL-FEU  POC occult blood, ED Provider will collect  Result Value Ref Range   Fecal Occult Bld NEGATIVE NEGATIVE  I-stat troponin, ED  Result Value Ref Range   Troponin i, poc 0.72 (HH) 0.00 - 0.08 ng/mL   Comment NOTIFIED PHYSICIAN    Comment 3           Dg Chest Port 1 View  Result Date: 09/13/2016 CLINICAL  DATA:  Lethargy. EXAM: PORTABLE CHEST 1 VIEW COMPARISON:  05/02/2016. FINDINGS: Interval borderline enlarged cardiac silhouette with increased prominence of the pulmonary vasculature and interstitial markings and small bilateral pleural effusions. The aorta is calcified. Diffuse osteopenia. IMPRESSION: Interval changes of acute congestive heart failure with borderline cardiomegaly. Electronically Signed   By: Beckie Salts M.D.   On: 09/13/2016 15:31    Initial Impression / Assessment and Plan / ED Course  I have reviewed the triage vital signs and the nursing notes.  Pertinent  labs & imaging results that were available during my care of the patient were reviewed by me and considered in my medical decision making (see chart for details).     Pt signed out to Dr. Denton LankSteinl at 450 pm Suspect  NSTEMI.  Pt needs to be checked for pulmonary embolism given elevated d-dimer Final Clinical Impressions(s) / ED Diagnoses  Diagnosis #1 weakness #2 anemia #3 renal insufficiency Final diagnoses:  None    New Prescriptions New Prescriptions   No medications on file     Doug SouJacubowitz, Tahjir Silveria, MD 09/13/16 [phone]

## 2016-09-13 NOTE — ED Notes (Signed)
Lab unable to find D-dimer that was sent down earlier. Lab redrawn and sent down, lab notified.

## 2016-09-14 DIAGNOSIS — R627 Adult failure to thrive: Secondary | ICD-10-CM

## 2016-09-14 DIAGNOSIS — Z515 Encounter for palliative care: Secondary | ICD-10-CM

## 2016-09-14 DIAGNOSIS — R531 Weakness: Secondary | ICD-10-CM

## 2016-09-14 DIAGNOSIS — Z66 Do not resuscitate: Secondary | ICD-10-CM

## 2016-09-14 DIAGNOSIS — E870 Hyperosmolality and hypernatremia: Secondary | ICD-10-CM

## 2016-09-14 DIAGNOSIS — R1312 Dysphagia, oropharyngeal phase: Secondary | ICD-10-CM

## 2016-09-14 LAB — COMPREHENSIVE METABOLIC PANEL
ALBUMIN: 3 g/dL — AB (ref 3.5–5.0)
ALT: 21 U/L (ref 14–54)
AST: 27 U/L (ref 15–41)
Alkaline Phosphatase: 93 U/L (ref 38–126)
Anion gap: 9 (ref 5–15)
BILIRUBIN TOTAL: 0.8 mg/dL (ref 0.3–1.2)
BUN: 43 mg/dL — AB (ref 6–20)
CHLORIDE: 115 mmol/L — AB (ref 101–111)
CO2: 27 mmol/L (ref 22–32)
Calcium: 8.9 mg/dL (ref 8.9–10.3)
Creatinine, Ser: 1.27 mg/dL — ABNORMAL HIGH (ref 0.44–1.00)
GFR calc Af Amer: 41 mL/min — ABNORMAL LOW (ref >60)
GFR calc non Af Amer: 36 mL/min — ABNORMAL LOW (ref >60)
GLUCOSE: 117 mg/dL — AB (ref 65–99)
POTASSIUM: 4.2 mmol/L (ref 3.5–5.1)
SODIUM: 151 mmol/L — AB (ref 135–145)
TOTAL PROTEIN: 6.1 g/dL — AB (ref 6.5–8.1)

## 2016-09-14 LAB — RETICULOCYTES
RBC.: 2.92 MIL/uL — AB (ref 3.87–5.11)
RETIC COUNT ABSOLUTE: 70.1 10*3/uL (ref 19.0–186.0)
RETIC CT PCT: 2.4 % (ref 0.4–3.1)

## 2016-09-14 LAB — FOLATE: Folate: 14.5 ng/mL (ref >5.9)

## 2016-09-14 LAB — CBC
HEMATOCRIT: 27.3 % — AB (ref 36.0–46.0)
Hemoglobin: 7.8 g/dL — ABNORMAL LOW (ref 12.0–15.0)
MCH: 26.7 pg (ref 26.0–34.0)
MCHC: 28.6 g/dL — AB (ref 30.0–36.0)
MCV: 93.5 fL (ref 78.0–100.0)
Platelets: 210 10*3/uL (ref 150–400)
RBC: 2.92 MIL/uL — ABNORMAL LOW (ref 3.87–5.11)
RDW: 15.7 % — AB (ref 11.5–15.5)
WBC: 6 10*3/uL (ref 4.0–10.5)

## 2016-09-14 LAB — BRAIN NATRIURETIC PEPTIDE: B Natriuretic Peptide: 1062.5 pg/mL — ABNORMAL HIGH (ref 0.0–100.0)

## 2016-09-14 LAB — IRON AND TIBC
Iron: 17 ug/dL — ABNORMAL LOW (ref 28–170)
SATURATION RATIOS: 5 % — AB (ref 10.4–31.8)
TIBC: 372 ug/dL (ref 250–450)
UIBC: 355 ug/dL

## 2016-09-14 LAB — TROPONIN I
TROPONIN I: 0.37 ng/mL — AB (ref <0.03)
Troponin I: 0.41 ng/mL (ref <0.03)
Troponin I: 0.32 ng/mL (ref <0.03)

## 2016-09-14 LAB — MRSA PCR SCREENING: MRSA by PCR: POSITIVE — AB

## 2016-09-14 LAB — VITAMIN B12: Vitamin B-12: 634 pg/mL (ref 180–914)

## 2016-09-14 LAB — FERRITIN: FERRITIN: 22 ng/mL (ref 11–307)

## 2016-09-14 MED ORDER — FOOD THICKENER (SIMPLYTHICK)
2.0000 | ORAL | Status: DC | PRN
  Filled 2016-09-14: qty 2

## 2016-09-14 MED ORDER — FUROSEMIDE 10 MG/ML IJ SOLN
20.0000 mg | Freq: Once | INTRAMUSCULAR | Status: AC
  Administered 2016-09-14: 20 mg via INTRAVENOUS
  Filled 2016-09-14: qty 2

## 2016-09-14 MED ORDER — RESOURCE THICKENUP CLEAR PO POWD
ORAL | Status: DC | PRN
  Filled 2016-09-14: qty 125

## 2016-09-14 MED ORDER — FOSFOMYCIN TROMETHAMINE 3 G PO PACK
3.0000 g | PACK | ORAL | Status: DC
  Filled 2016-09-14: qty 3

## 2016-09-14 MED ORDER — MORPHINE SULFATE (CONCENTRATE) 10 MG/0.5ML PO SOLN: 5.0000 mg | ORAL | Status: DC | PRN

## 2016-09-14 NOTE — Consult Note (Signed)
Consultation Note Date: 09/14/2016   Patient Name: Melanie Roberson  DOB: 11-05-1923  MRN: [id number]  Age / Sex: 83 y.o., female  PCP: No primary care provider on file. Referring Physician: Osvaldo Shipper, MD  Reason for Consultation: Establishing goals of care, Non pain symptom management, Pain control and Psychosocial/spiritual support  HPI/Patient Profile: 83 y.o. female   admitted on 09/13/2016 with a past medical history of vascular dementia, unspecified congestive heart failure, hypothyroidism, schizophrenia, essential hypertension, depression, who lives in a skilled nursing facility for past 9 years and per family has had continued physical, functional and cognitive decline.   She was sent  from the skilled nursing facility due to increasing lethargy and low O2 sats in the 70s.   In the emergency department, patient was found to have a mildly elevated troponin, elevated creatinine compared to baseline, elevated d-dimer and chest x-ray that shows cardiomegaly.   Family faces advanced directive decisions and anticipatory care needs.  Clinical Assessment and Goals of Care:  This NP Lorinda Creed reviewed medical records, received report from team, assessed the patient and then meet at the patient's bedside along with her large family, stated grand-daughter' Starr Sinclair and her husband, and several grandsons and other family members.  to discuss diagnosis, prognosis, GOC, EOL wishes disposition and options.  A detailed discussion was had today regarding advanced directives.  Concepts specific to code status, artifical feeding and hydration, continued IV antibiotics and rehospitalization was had.    Discussed expected trajectory of dementia and failure to thrive.  The difference between a aggressive medical intervention path  and a palliative comfort care path for this patient at this time was had.   Values and goals of care important to patient and family were attempted to be elicited.  MOST form completed  Concept of Hospice and Palliative Care were discussed  Natural trajectory and expectations at EOL were discussed.  Questions and concerns addressed.   Family encouraged to call with questions or concerns.  PMT will continue to support holistically.  There is no known documented HPOA.  Orissa Arreaga is the main support person.  All family present today  support her as Management consultant.  SUMMARY OF RECOMMENDATIONS    -focus of care is comfort and digntiy  Code Status/Advance Care Planning:  DNR-documented today   Symptom Management:   Pain/Dyspnea: Roxanol 5 mg po/sl every 4 hrs prn  Palliative Prophylaxis:   Aspiration, Bowel Regimen, Delirium Protocol, Frequent Pain Assessment and Oral Care  Additional Recommendations (Limitations, Scope, Preferences):  Full Comfort Care  Psycho-social/Spiritual:   Desire for further Chaplaincy support:yes  Additional Recommendations: Education on Hospice  Prognosis:   < 6 months  Discharge Planning: Skilled Nursing Facility with Hospice      Primary Diagnoses: Present on Admission: . Schizophrenia, unspecified type (HCC) . Vascular dementia with behavior disturbance . Weight loss . Hypothyroidism . Failure to thrive in adult . B12 deficiency anemia . Elevated troponin . Hypoxia   I have reviewed the medical record, interviewed the  patient and family, and examined the patient. The following aspects are pertinent.  Past Medical History:  Diagnosis Date  . Anemia   . CHF (congestive heart failure) (HCC)   . Depression   . Hyperlipidemia   . Hypertension   . Insomnia 08/11/2013  . MYALGIA 07/13/2007   Qualifier: Diagnosis of  By: Gavin PottersGRANDIS MD, HEIDI    . SCHIZOPHRENIA 04/29/2006   Qualifier: Diagnosis of  By: Erenest RasherVANSTORY MD, MADELEINE    . Thyroid disease   . TREMOR, ESSENTIAL 05/28/2006   Qualifier: Diagnosis of   By: Erenest RasherVANSTORY MD, MADELEINE    . Vascular dementia    Social History   Social History  . Marital status: Widowed    Spouse name: N/A  . Number of children: N/A  . Years of education: N/A   Social History Main Topics  . Smoking status: Former Games developermoker  . Smokeless tobacco: Current User    Types: Snuff  . Alcohol use No  . Drug use: No  . Sexual activity: No   Other Topics Concern  . None   Social History Narrative  . None   Family History  Problem Relation Age of Onset  . Family history unknown: Yes   Scheduled Meds: . aspirin EC  81 mg Oral Daily  . DULoxetine  30 mg Oral Daily  . heparin subcutaneous  5,000 Units Subcutaneous Q8H  . levothyroxine  125 mcg Oral QAC breakfast  . multivitamin with minerals  1 tablet Oral Daily  . OLANZapine  5 mg Oral QHS  . sodium chloride flush  3 mL Intravenous Q12H  . sodium chloride flush  3 mL Intravenous Q12H  . cyanocobalamin  500 mcg Oral Daily   Continuous Infusions: . sodium chloride     PRN Meds:.sodium chloride, acetaminophen **OR** acetaminophen, albuterol, hydrALAZINE, ondansetron **OR** ondansetron (ZOFRAN) IV, RESOURCE THICKENUP CLEAR, sodium chloride flush Medications Prior to Admission:  Prior to Admission medications   Medication Sig Start Date End Date Taking? Authorizing Provider  acetaminophen (TYLENOL) 500 MG tablet Take 500 mg by mouth every 6 (six) hours as needed.   Yes [provider]  cetirizine (ZYRTEC) 5 MG tablet Take 5 mg by mouth as needed for allergies.   Yes [provider]  cyanocobalamin 500 MCG tablet Take 500 mcg by mouth daily. For B-12 deficiency   Yes [provider]  DULoxetine (CYMBALTA) 30 MG capsule Take 30 mg by mouth daily. For anxiety   Yes [provider]  guaiFENesin (ROBITUSSIN) 100 MG/5ML liquid Take 200 mg by mouth every 6 (six) hours as needed for cough. Reported on 06/12/2015   Yes [provider]  lactulose (CHRONULAC) 10 GM/15ML  solution Take 30 g by mouth every morning.    Yes [provider]  levothyroxine (SYNTHROID, LEVOTHROID) 125 MCG tablet Take 125 mcg by mouth daily before breakfast.   Yes [provider]  magnesium hydroxide (MILK OF MAGNESIA) 400 MG/5ML suspension Take 30 mLs by mouth daily as needed for mild constipation.   Yes [provider]  Multiple Vitamins-Minerals (MULTIVITAMIN WITH MINERALS) tablet Take 1 tablet by mouth daily.   Yes [provider]  nicotine (NICODERM CQ - DOSED IN MG/24 HR) 7 mg/24hr patch Place 7 mg onto the skin daily.   Yes [provider]  OLANZapine (ZYPREXA) 5 MG tablet Take 5 mg by mouth at bedtime.    Yes [provider]  polyethylene glycol powder (MIRALAX) powder Take 17 g by mouth daily. For constipation.  Dispense 1 month supply.   Yes [provider]  Polyvinyl Alcohol-Povidone (FRESHKOTE) 2.7-2 % SOLN Instill 1 drop in both eyes three times a day   Yes [provider]  potassium chloride SA (KLOR-CON M20) 20 MEQ tablet Take 20 mEq by mouth 2 (two) times daily.   Yes [provider]  Skin Protectants, Misc. (CALAZIME SKIN PROTECTANT EX) Apply to bilateral buttocks and coccyx area 2 times daily and as needed   Yes [provider]  torsemide (DEMADEX) 20 MG tablet Take 20 mg by mouth 2 (two) times daily.   Yes [provider]  UNABLE TO FIND Med Pass 120 cc by mouth 3 times daily   Yes [provider]   Allergies  Allergen Reactions  . Erythromycin     Per MAR  . Penicillins     Per MAR  . Sulfa Antibiotics     Per MAR   Review of Systems  Unable to perform ROS: Dementia    Physical Exam  Constitutional: She appears cachectic. She appears ill.  Cardiovascular: Normal rate, regular rhythm and normal heart sounds.   Pulmonary/Chest: She has decreased breath sounds in the right lower field and the left lower field.  Musculoskeletal:  generalized weakness and  muscle atrophy  Neurological:  -non verbal, unable to follow comands   Skin: Skin is warm and dry.    Vital Signs: BP 115/73 (BP Location: Right Arm)   Pulse 90   Temp 98 F (36.7 C) (Axillary)   Resp 13   Wt 66 kg (145 lb 8 oz)   SpO2 100%   BMI 27.49 kg/m  Pain Assessment: PAINAD       SpO2: SpO2: 100 % O2 Device:SpO2: 100 % O2 Flow Rate: .O2 Flow Rate (L/min): 2 L/min  IO: Intake/output summary:  Intake/Output Summary (Last 24 hours) at 09/14/16 1458 Last data filed at 09/13/16 1833  Gross per 24 hour  Intake              500 ml  Output                0 ml  Net              500 ml    LBM:   Baseline Weight: Weight: 65.1 kg (143 lb 8 oz) Most recent weight: Weight: 66 kg (145 lb 8 oz)     Palliative Assessment/Data:   Discussed with Dr Rito Ehrlich  Time In: 1530 Time Out: 1700 Time Total:   90 min Greater than 50%  of this time was spent counseling and coordinating care related to the above assessment and plan.  Signed by: Lorinda Creed, NP   Please contact Palliative Medicine Team phone at [phone] for questions and concerns.  For individual provider: See Loretha Stapler

## 2016-09-14 NOTE — Progress Notes (Signed)
OT Cancellation Note  Patient Details Name: Melanie Roberson MRN: [id number] DOB: 05-Sep-1923   Cancelled Treatment:    Reason Eval/Treat Not Completed: OT screened, no needs identified, will sign off. Pt with baseline dependence in ADL and bed bound. No acute OT needs.  Melanie Roberson, Melanie Roberson 09/14/2016, 12:54 PM

## 2016-09-14 NOTE — Progress Notes (Signed)
Pharmacy Antibiotic Note  Melanie Roberson is a 83 y.o. female with concern of UTI (she is noted from a SNF).  Pharmacy has been consulted for po cipro dosing. She is noted with allergies to PCN and sulfa (reaction unknown).  Ecoli noted in urine cultures. WBC= 6, afebrile  -Spoke to Dr. Rito EhrlichKrishnan due to possible resistance to e coli will change from cipro to fosfomycin. Plans for cipro based on culture sensitivities.   Plan: -Fosformycin 3gm po q48h x3 dosed -Will follow cultures  Harland GermanAndrew Otilio Groleau, Pharm D 09/14/2016 5:56 PM     Weight: 145 lb 8 oz (66 kg)  Temp (24hrs), Avg:98 F (36.7 C), Min:97.9 F (36.6 C), Max:98 F (36.7 C)   Recent Labs Lab 09/13/16 1332 09/14/16 0536  WBC 4.1 6.0  CREATININE 1.33* 1.27*    Estimated Creatinine Clearance: 25.1 mL/min (A) (by C-G formula based on SCr of 1.27 mg/dL (H)).    Allergies  Allergen Reactions  . Erythromycin     Per MAR  . Penicillins     Per MAR  . Sulfa Antibiotics     [facility]    Thank you for allowing pharmacy to be a part of this patient's care.  Harland GermanAndrew Alegria Dominique, Pharm D 09/14/2016 5:56 PM

## 2016-09-14 NOTE — Progress Notes (Signed)
PT Cancellation Note  Patient Details Name: Melanie Roberson MRN: [id number] DOB: Apr 14, 1923   Cancelled Treatment:    Reason Eval/Treat Not Completed: PT screened, no needs identified, will sign off Pt is from SNF and is nonambulatory at baseline per MD notes. Per RN, pt combative at times. Pt appears to be functioning at baseline and has no need for PT services at this time. Pt with vascular dementia. Anticipate pt will return to SNF at d/c. Please re consult if anything changes. Thanks.   Melanie Roberson 09/14/2016, 10:18 AM  Melanie Roberson, PT, DPT [phone]

## 2016-09-14 NOTE — Progress Notes (Signed)
Patient to 3W-34 from the CT area, apparently she was unable to do patient's scan D/T a postional IV, RN from ED placed a new IV site in her Left axillary area (20 gauge) and brought her to the unit since they were unable to do test at that time, shortly after arrival to floor, CT called to get patient for her scan, got her placed on a monitor, did VS and a quick assessment while awaiting arrival, will send with Transport when ready, no other changes noted.

## 2016-09-14 NOTE — Consult Note (Addendum)
WOC Nurse wound consult note Reason for Consult: Consult requested for BLE. Wound type: Left leg with small dry yellow scab; .2X.2cm, appears to be previous stasis ulcer which has healed. Right leg with stasis ulcer; 3X1X.2cm, 50% red, 50% yellow, small amt bloody drainage, no odor Dressing procedure/placement/frequency: Foam dressing to protect and promote healing.  Discussed plan of care with family members at the bedside. Please re-consult if further assistance is needed.  Thank-you,  Cammie Mcgeeawn Samie Reasons MSN, RN, CWOCN, [place]AP, CNS [phone]

## 2016-09-14 NOTE — Plan of Care (Signed)
Problem: Pain Managment: Goal: General experience of comfort will improve Outcome: Progressing No s/s of pain noted at this time, VSS, will continue to monitor.

## 2016-09-14 NOTE — Evaluation (Signed)
Clinical/Bedside Swallow Evaluation Patient Details  Name: Melanie Roberson MRN: [id number] Date of Birth: 22-Feb-1924  Today's Date: 09/14/2016 Time: SLP Start Time (ACUTE ONLY): 4098 SLP Stop Time (ACUTE ONLY): 0921 SLP Time Calculation (min) (ACUTE ONLY): 16 min  Past Medical History:  Past Medical History:  Diagnosis Date  . Anemia   . CHF (congestive heart failure) (HCC)   . Depression   . Hyperlipidemia   . Hypertension   . Insomnia 08/11/2013  . MYALGIA 07/13/2007   Qualifier: Diagnosis of  By: Gavin Potters MD, HEIDI    . SCHIZOPHRENIA 04/29/2006   Qualifier: Diagnosis of  By: Erenest Rasher MD, MADELEINE    . Thyroid disease   . TREMOR, ESSENTIAL 05/28/2006   Qualifier: Diagnosis of  By: Erenest Rasher MD, MADELEINE    . Vascular dementia    Past Surgical History: History reviewed. No pertinent surgical history. HPI:  Melanie Roberson a 83 y.o.femalewith a past medical history of vascular dementia, unspecified congestive heart failure, myalgia, hypothyroidism, schizophrenia, essential hypertension, depression, who lives in a skilled nursing facility and based on notes has been losing weight for the past many months. Pt admitted due to increasing lethargy past 24 hours and hypoxiawith sats in the 70s. Patient was found to have a mildly elevated troponin, elevated creatinine compared to baseline, elevated d-dimer. Chest CT Moderate-sized bilateral pleural effusions with bilateral lower lobe partial compressive atelectasis versus infiltrate. Subsegmental consolidative changes in the posterior upper lobe along the fissure defect versus infiltrate.   Assessment / Plan / Recommendation Clinical Impression  Highly suspect pt experienced larygneal penetration and/or aspiration following sips thin due to strong cough (reddened face) consistently. Pt holding meds and/or breakfast po's in oral cavity from earlier this morning. Small sip given with pt holding in oral cavity unable to transit despite verbal  cueing. Pt expectorated significant amount of liquid mixed with small pieces pill (?) into cup. Agree with MD's recommendation for Palliative medicine consult for goals of care including nutrition. Will change to nectar thick however if pt has pharyngeal residuals with thick liquids, this carries aspiration risk as well. Will modify to Dys 1 (puree), nectar thick, crush pills, CHECK MOUTH for pocketed food, full supervision, cup sips preferred. ST will follow for plan.  SLP Visit Diagnosis: Dysphagia, unspecified (R13.10)    Aspiration Risk  Severe aspiration risk    Diet Recommendation Dysphagia 1 (Puree);Nectar-thick liquid   Liquid Administration via: Cup Medication Administration: Crushed with puree Supervision: Full supervision/cueing for compensatory strategies;Patient able to self feed Compensations: Slow rate;Minimize environmental distractions;Small sips/bites;Lingual sweep for clearance of pocketing;Multiple dry swallows after each bite/sip Postural Changes: Seated upright at 90 degrees;Remain upright for at least 30 minutes after po intake    Other  Recommendations Oral Care Recommendations: Oral care BID Other Recommendations: Order thickener from pharmacy   Follow up Recommendations  (TBD)      Frequency and Duration min 2x/week  2 weeks       Prognosis Prognosis for Safe Diet Advancement: Fair Barriers to Reach Goals: Cognitive deficits      Swallow Study   General HPI: Melanie Roberson a 83 y.o.femalewith a past medical history of vascular dementia, unspecified congestive heart failure, myalgia, hypothyroidism, schizophrenia, essential hypertension, depression, who lives in a skilled nursing facility and based on notes has been losing weight for the past many months. Pt admitted due to increasing lethargy past 24 hours and hypoxiawith sats in the 70s. Patient was found to have a mildly elevated troponin, elevated creatinine  compared to baseline, elevated d-dimer.  Chest CT Moderate-sized bilateral pleural effusions with bilateral lower lobe partial compressive atelectasis versus infiltrate. Subsegmental consolidative changes in the posterior upper lobe along the fissure defect versus infiltrate. Type of Study: Bedside Swallow Evaluation Previous Swallow Assessment:  (none) Diet Prior to this Study: Dysphagia 2 (chopped);Thin liquids Temperature Spikes Noted: No Respiratory Status: Nasal cannula History of Recent Intubation: No Behavior/Cognition: Alert;Pleasant mood;Cooperative;Confused;Requires cueing Oral Cavity Assessment:  (holding all meds given recently in oral cavity) Oral Care Completed by SLP: Yes Oral Cavity - Dentition: Edentulous Vision: Functional for self-feeding Self-Feeding Abilities: Needs assist;Needs set up;Able to feed self Patient Positioning: Upright in bed Baseline Vocal Quality: Normal Volitional Cough: Cognitively unable to elicit Volitional Swallow: Unable to elicit    Oral/Motor/Sensory Function Overall Oral Motor/Sensory Function:  (unable to participate, no focal weakness)   Ice Chips Ice chips: Not tested   Thin Liquid Thin Liquid: Impaired Presentation: Cup;Straw Oral Phase Impairments: Reduced lingual movement/coordination Oral Phase Functional Implications: Oral holding Pharyngeal  Phase Impairments: Cough - Immediate;Suspected delayed Swallow    Nectar Thick Nectar Thick Liquid: Not tested   Honey Thick Honey Thick Liquid: Not tested   Puree Puree: Impaired Pharyngeal Phase Impairments: Suspected delayed Swallow (suspect pharyngeal residue)   Solid   GO   Solid: Not tested        Melanie Roberson, Melanie Roberson 09/14/2016,9:42 AM  Melanie Roberson M.Ed [employer]-SLP Pager [phone]

## 2016-09-14 NOTE — Progress Notes (Addendum)
[facility] PROGRESS NOTE  Melanie MorrisonHelen M Roberson WUJ:[id number] DOB: 07/05/1923 DOA: 09/13/2016  PCP: Kirt Boysarter, Monica, DO  Brief History/Interval Summary: 83 year old Caucasian female with a past medical history of vascular dementia, schizophrenia, unspecified congestive heart failure, hypothyroidism, depression, who lives in a skilled nursing facility and has been losing weight for the past many months. She was sent over to the emergency department due to lethargy and hypoxia. She was found to have elevated d-dimer, mildly elevated troponin. She was hospitalized for further management.  Reason for Visit: Hypoxia. Elevated troponin  Consultants: Palliative medicine  Procedures: Transthoracic echocardiogram is pending  Antibiotics: None  Subjective/Interval History: Patient remains confused this morning. Unable to answer any questions.  ROS: Unable to do due to her dementia  Objective:  Vital Signs  Vitals:   09/13/16 2347 09/13/16 2350 09/14/16 0005 09/14/16 0535  BP: (!) 197/75  116/65 115/73  Pulse:  79 80 90  Resp:  17 14 13   Temp:    98 F (36.7 C)  TempSrc:    Axillary  SpO2:  100% 100% 100%  Weight:    66 kg (145 lb 8 oz)    Intake/Output Summary (Last 24 hours) at 09/14/16 1003 Last data filed at 09/13/16 1833  Gross per 24 hour  Intake              500 ml  Output                0 ml  Net              500 ml   Filed Weights   09/13/16 2020 09/14/16 0535  Weight: 65.1 kg (143 lb 8 oz) 66 kg (145 lb 8 oz)    General appearance: alert, delirious, distracted and no distress Head: Normocephalic, without obvious abnormality, atraumatic Resp: Coarse breath sounds bilaterally. Crackles bilaterally. No wheezing. No rhonchi. Cardio: regular rate and rhythm, S1, S2 normal, no murmur, click, rub or gallop GI: soft, non-tender; bowel sounds normal; no masses,  no organomegaly Extremities: Abrasions noted on both the extremities. Shallow skin ulcer in the left lower  extremity. No significant edema is present. Neurologic: Awake, alert, disoriented and confused. This could be her baseline. Moving all her extremities. Pushing me away.  Lab Results:  Data Reviewed: I have personally reviewed following labs and imaging studies  CBC:  Recent Labs Lab 09/13/16 1332 09/14/16 0536  WBC 4.1 6.0  NEUTROABS 3.1  --   HGB 8.1* 7.8*  HCT 27.3* 27.3*  MCV 92.2 93.5  PLT 193 210    Basic Metabolic Panel:  Recent Labs Lab 09/13/16 1332 09/14/16 0536  NA 149* 151*  K 4.3 4.2  CL 112* 115*  CO2 27 27  GLUCOSE 96 117*  BUN 44* 43*  CREATININE 1.33* 1.27*  CALCIUM 9.0 8.9    GFR: Estimated Creatinine Clearance: 25.1 mL/min (A) (by C-G formula based on SCr of 1.27 mg/dL (H)).  Liver Function Tests:  Recent Labs Lab 09/13/16 1332 09/14/16 0536  AST 29 27  ALT 20 21  ALKPHOS 100 93  BILITOT 0.6 0.8  PROT 6.1* 6.1*  ALBUMIN 2.8* 3.0*    Cardiac Enzymes:  Recent Labs Lab 09/13/16 2310 09/14/16 0536  TROPONINI 0.41* 0.32*    Anemia Panel:  Recent Labs  09/14/16 0536  VITAMINB12 634  FOLATE 14.5  FERRITIN 22  TIBC 372  IRON 17*  RETICCTPCT 2.4    Recent Results (from the past 240 hour(s))  MRSA PCR  Screening     Status: Abnormal   Collection Time: 09/14/16 12:17 AM  Result Value Ref Range Status   MRSA by PCR POSITIVE (A) NEGATIVE Final    Comment:        The GeneXpert MRSA Assay (FDA approved for NASAL specimens only), is one component of a comprehensive MRSA colonization surveillance program. It is not intended to diagnose MRSA infection nor to guide or monitor treatment for MRSA infections. RESULT CALLED TO, READ BACK BY AND VERIFIED WITH: RODGERS,R RN [id number] AT [phone] [facility]       Radiology Studies: Ct Angio Chest Pe W And/or Wo Contrast  Result Date: 09/13/2016 CLINICAL DATA:  83 year old female with fatigue and elevated D-dimer. Rule out PE. EXAM: CT ANGIOGRAPHY CHEST WITH CONTRAST TECHNIQUE:  Multidetector CT imaging of the chest was performed using the standard protocol during bolus administration of intravenous contrast. Multiplanar CT image reconstructions and MIPs were obtained to evaluate the vascular anatomy. CONTRAST:  100 cc Isovue 370. COMPARISON:  Chest radiograph 09/13/2016 FINDINGS: Cardiovascular: Top-normal cardiac size. No pericardial effusion. There is coronary vascular calcification involving the left main, lad, left circumflex artery, and RCA. There is advanced atherosclerotic calcification of the thoracic aorta. No aneurysmal dilatation or evidence of dissection. There is atherosclerotic calcification of the origins of the great vessels of the aortic arch. The visualized portions of the vessels of the aortic arch appear patent. There is no CT evidence of pulmonary embolism. Mediastinum/Nodes: There is no hilar or mediastinal adenopathy. There is a small hiatal hernia. The esophagus is grossly unremarkable. Lungs/Pleura: There moderate size bilateral pleural effusions. There is associated compressive atelectasis of the right lower lobes. Pneumonia is not excluded. There is consolidative changes of the posterior left upper lobe and lingula along the fissure. Small ground-glass subpleural nodular density in the lingula noted which may represent atelectatic changes versus infiltrate. Clinical correlation is recommended. There is diffuse peribronchial thickening. There is a 13 x 9 mm peribronchovascular nodular density in the left upper lobe (series 9, image 52) which may the inflammatory/infectious in etiology. However, developing neoplasm is not excluded. Ground-glass nodular density in the right lower lobe along the fissure (series 9 image 89) likely inflammatory/infectious. There is no pneumothorax. The central airways are patent. Upper Abdomen: There is morphologic changes of cirrhosis. The spleen is only partially visualized but appears mildly enlarged. Musculoskeletal: Osteopenia  with degenerative changes of the spine. No acute fracture. Review of the MIP images confirms the above findings. IMPRESSION: 1. No CT evidence of pulmonary artery embolism. 2. Moderate-sized bilateral pleural effusions with bilateral lower lobe partial compressive atelectasis versus infiltrate. Subsegmental consolidative changes in the posterior upper lobe along the fissure defect versus infiltrate. 3. Scattered ground-glass nodular densities, likely inflammatory/infectious. Diffuse peribronchial thickening with a focal nodular density in the right upper lobe which also likely inflammatory/infectious in etiology. Developing neoplasm is less likely but not excluded. Clinical correlation and follow-up recommended. 4. Coronary vascular calcification and advanced atherosclerotic disease of the thoracic aorta. 5. Cirrhosis all 6.  Aortic Atherosclerosis (ICD10-I70.0). Electronically Signed   By: Elgie Collard M.D.   On: 09/13/2016 21:50   Dg Chest Port 1 View  Result Date: 09/13/2016 CLINICAL DATA:  Lethargy. EXAM: PORTABLE CHEST 1 VIEW COMPARISON:  05/02/2016. FINDINGS: Interval borderline enlarged cardiac silhouette with increased prominence of the pulmonary vasculature and interstitial markings and small bilateral pleural effusions. The aorta is calcified. Diffuse osteopenia. IMPRESSION: Interval changes of acute congestive heart failure with borderline cardiomegaly. Electronically Signed  By: Beckie Salts M.D.   On: 09/13/2016 15:31     Medications:  Scheduled: . aspirin EC  81 mg Oral Daily  . DULoxetine  30 mg Oral Daily  . heparin subcutaneous  5,000 Units Subcutaneous Q8H  . levothyroxine  125 mcg Oral QAC breakfast  . multivitamin with minerals  1 tablet Oral Daily  . OLANZapine  5 mg Oral QHS  . sodium chloride flush  3 mL Intravenous Q12H  . sodium chloride flush  3 mL Intravenous Q12H  . cyanocobalamin  500 mcg Oral Daily   Continuous: . sodium chloride     ZOX:WRUEAV chloride,  acetaminophen **OR** acetaminophen, albuterol, hydrALAZINE, ondansetron **OR** ondansetron (ZOFRAN) IV, RESOURCE THICKENUP CLEAR, sodium chloride flush  Assessment/Plan:  Active Problems:   Schizophrenia, unspecified type (HCC)   Vascular dementia with behavior disturbance   Hypothyroidism   B12 deficiency anemia   Weight loss   Failure to thrive in adult   Elevated troponin   Hypoxia    Acute congestive heart failure, unspecified/elevated troponin/hypoxia Patient has a known history of CHF, although, unspecified if systolic or diastolic. No old echocardiogram reports are available. One has been ordered. CT scan of the chest was done last night to rule out pulmonary embolism. No PE was noted. Groundglass nodular densities were noted. Her BNP is noted to be elevated. There could be an element of fluid overload. We will give a small dose of Lasix. Await echocardiogram. Troponins are trending down. Patient is not a candidate for further ischemic workup due to her dementia. Continue aspirin.  Possible aspiration. Patient does have advanced dementia. Quite likely that she is aspirating based on the CT report. No clearcut infiltrate noted. Seen by speech therapy this morning. She has been placed on dysphagia 1 diet. All of this is a sequelae of her dementia.  Lethargy/advanced dementia/schizophrenia/depression. Based on notes from the nursing facility it appears the patient has been declining over the last few months. I discussed these issues with Britta Mccreedy, her granddaughter. She is amenable to having a discussion with palliative medicine for goals of care. She will get rest of the family members together. Patient likely approaching end-of-life.  Mild acute renal failure. She likely has chronic kidney disease, unspecified. Creatinine is better today compared to yesterday. She was given IV fluids in the emergency department. Holding off because of concern for CHF. Monitor urine  output.  Normocytic anemia, likely anemia of chronic disease. Hemoglobin slightly lower today compared to yesterday. Her stool was heme-negative. There is no overt bleeding that is appreciated. Iron level was 17. Ferritin is 22, TIBC 372. Folic acid 14.5, B-12 level 634. Could consider iron supplementation, but it will not change her long-term outcome. No indication for blood transfusion at this time.  Hypernatremia Likely due to free water deficit from poor oral intake. Patient needs to be encouraged to take orally. Monitor labs closely.  History of hypothyroidism. Continue with Synthroid.  Skin abrasions and ulcer in the left lower extremity. Wound care nurse consulted.  Dysphagia. Continue current diet. Seen by therapy.  Goals of care. Palliative Medicine has been consulted. Dscussed with patient's granddaughter and she is willing to talk to them. She was told about patient's current condition and the fact that she will will most likely continue to decline. She was also notified about the patient's dysphagia.  DVT Prophylaxis: Subcutaneous heparin    Code Status: Full code for now  Family Communication: Discussed with Britta Mccreedy, her granddaughter  Disposition Plan: Management as  outlined above. Palliative medicine team has been consulted.    LOS: 1 day   [facility]  [facility] Pager [phone] 09/14/2016, 10:03 AM  If 7PM-7AM, please contact night-coverage at [email or web address], password [facility]

## 2016-09-14 NOTE — Plan of Care (Signed)
Problem: Safety: Goal: Ability to remain free from injury will improve Outcome: Not Progressing Patient has advanced stage Dementia and is unable to comprehend so bed alarm placed on and instructed to stay in bed, will continue to make frequent rounds on patient.

## 2016-09-14 NOTE — Plan of Care (Signed)
Problem: Skin Integrity: Goal: Risk for impaired skin integrity will decrease Outcome: Progressing Patient was seen by Wound care and recommendations and care given. Patient has as foam Allevyn to her right upper arm and her right lower leg that are C,D,I, will continue to monitor.

## 2016-09-14 NOTE — Progress Notes (Signed)
Report received at the nurses station via [place] RN since patient has not arrived from ED yet, she is going to CT for a scan and then will come to 3W-34, will await her arrival.

## 2016-09-15 ENCOUNTER — Encounter (HOSPITAL_COMMUNITY): Payer: Medicare Other

## 2016-09-15 ENCOUNTER — Inpatient Hospital Stay (HOSPITAL_COMMUNITY): Payer: Medicare Other

## 2016-09-15 DIAGNOSIS — I360 Nonrheumatic tricuspid (valve) stenosis: Secondary | ICD-10-CM

## 2016-09-15 LAB — ECHOCARDIOGRAM COMPLETE
CHL CUP PV REG GRAD DIAS: 8 mmHg
E decel time: 144 msec
EERAT: 16.15
FS: 33 % (ref 28–44)
IVS/LV PW RATIO, ED: 1.02
LA diam end sys: 43 mm
LA vol A4C: 68.7 ml
LA vol: 73.4 mL
LADIAMINDEX: 2.52 cm/m2
LASIZE: 43 mm
LAVOLIN: 43.1 mL/m2
LDCA: 2.01 cm2
LV E/e' medial: 16.15
LV TDI E'LATERAL: 8.05
LVEEAVG: 16.15
LVELAT: 8.05 cm/s
LVOT diameter: 16 mm
MV Dec: 144
MV VTI: 214 cm
MV pk A vel: 112 m/s
MVPG: 7 mmHg
MVPKEVEL: 130 m/s
PV Reg vel dias: 144 cm/s
PW: 12.4 mm — AB (ref 0.6–1.1)
RV LATERAL S' VELOCITY: 11.3 cm/s
RV TAPSE: 24.8 mm
Reg peak vel: 394 cm/s
TDI e' medial: 6.64
TR max vel: 394 cm/s
Weight: 2328 oz

## 2016-09-15 LAB — BASIC METABOLIC PANEL
ANION GAP: 8 (ref 5–15)
BUN: 45 mg/dL — ABNORMAL HIGH (ref 6–20)
CALCIUM: 9.2 mg/dL (ref 8.9–10.3)
CHLORIDE: 118 mmol/L — AB (ref 101–111)
CO2: 26 mmol/L (ref 22–32)
Creatinine, Ser: 1.23 mg/dL — ABNORMAL HIGH (ref 0.44–1.00)
GFR calc non Af Amer: 37 mL/min — ABNORMAL LOW (ref >60)
GFR, EST AFRICAN AMERICAN: 43 mL/min — AB (ref >60)
Glucose, Bld: 101 mg/dL — ABNORMAL HIGH (ref 65–99)
Potassium: 3.9 mmol/L (ref 3.5–5.1)
Sodium: 152 mmol/L — ABNORMAL HIGH (ref 135–145)

## 2016-09-15 LAB — CBC
HEMATOCRIT: 26.7 % — AB (ref 36.0–46.0)
HEMOGLOBIN: 7.9 g/dL — AB (ref 12.0–15.0)
MCH: 27.3 pg (ref 26.0–34.0)
MCHC: 29.6 g/dL — ABNORMAL LOW (ref 30.0–36.0)
MCV: 92.4 fL (ref 78.0–100.0)
Platelets: 178 10*3/uL (ref 150–400)
RBC: 2.89 MIL/uL — ABNORMAL LOW (ref 3.87–5.11)
RDW: 16 % — AB (ref 11.5–15.5)
WBC: 6.7 10*3/uL (ref 4.0–10.5)

## 2016-09-15 LAB — URINE CULTURE

## 2016-09-15 MED ORDER — FOSFOMYCIN TROMETHAMINE 3 G PO PACK: 3.0000 g | PACK | ORAL | Status: AC

## 2016-09-15 MED ORDER — MUPIROCIN 2 % EX OINT
1.0000 "application " | TOPICAL_OINTMENT | Freq: Two times a day (BID) | CUTANEOUS | Status: DC
  Administered 2016-09-15: 1 via NASAL
  Filled 2016-09-15: qty 22

## 2016-09-15 MED ORDER — CHLORHEXIDINE GLUCONATE CLOTH 2 % EX PADS: 6.0000 | MEDICATED_PAD | Freq: Every day | CUTANEOUS | Status: DC

## 2016-09-15 MED ORDER — MORPHINE SULFATE (CONCENTRATE) 10 MG/0.5ML PO SOLN: 5.0000 mg | ORAL | 0 refills | Status: AC | PRN

## 2016-09-15 NOTE — Progress Notes (Signed)
  Echocardiogram 2D Echocardiogram has been performed.  Melanie Roberson 09/15/2016, 1:42 PM

## 2016-09-15 NOTE — Discharge Instructions (Signed)
Hospice Introduction Hospice is a service that is designed to provide people who are terminally ill and their families with medical, spiritual, and psychological support. Its aim is to improve your quality of life by keeping you as alert and comfortable as possible. Who will be my providers when I begin hospice care? Hospice teams often include:  A nurse.  A doctor. The hospice doctor will be available for your care, but you can bring your regular doctor or nurse practitioner.  Social workers.  Religious leaders (such as a Clinical biochemist).  Trained volunteers.  What roles will providers play in my care? Hospice is performed by a team of health care professionals and volunteers who:  Help keep you comfortable: ? Hospice can be provided in your home or in a homelike setting. ? The hospice staff works with your family and friends to help meet your needs. ? You will enjoy the support of loved ones by receiving much of your basic care from family and friends.  Provide pain relief and manage your symptoms. The staff supply all necessary medicines and equipment.  Provide companionship when you are alone.  Allow you and your family to rest. They may do light housekeeping, prepare meals, and run errands.  Provide counseling. They will make sure your emotional, spiritual, and social needs and those of your family are being met.  Provide spiritual care: ? Spiritual care will be individualized to meet your needs and your family's needs. ? Spiritual care may involve:  Helping you look at what death means to you.  Helping you say goodbye to your family and friends.  Performing a specific religious ceremony or ritual.  When should hospice care begin? Most people who use hospice are believed to have fewer than 6 months to live.  Your family and health care providers can help you decide when hospice services should begin.  If your condition improves, you may discontinue the program.  What  should I consider before selecting a program? Most hospice programs are run by nonprofit, independent organizations. Some are affiliated with hospitals, nursing homes, or home health care agencies. Hospice programs can take place in the home or at a hospice center, hospital, or skilled nursing facility. When choosing a hospice program, ask the following questions:  What services are available to me?  What services will be offered to my loved ones?  How involved will my loved ones be?  How involved will my health care provider be?  Who makes up the hospice care team? How are they trained or screened?  How will my pain and symptoms be managed?  If my circumstances change, can the services be provided in a different setting, such as my home or in the hospital?  Is the program reviewed and licensed by the state or certified in some other way?  Where can I learn more about hospice? You can learn about existing hospice programs in your area from your health care providers. You can also read more about hospice online. The websites of the following organizations contain helpful information:  The [facility] and Palliative Care Organization [facility]).  The Hospice Association of America ([place]).  The [place].  The American Cancer Society (ACS).  Hospice Net.  This information is not intended to replace advice given to you by your health care provider. Make sure you discuss any questions you have with your health care provider. Document Released: 06/05/2003 Document Revised: 10/03/2015 Document Reviewed: 12/27/2012 Elsevier Interactive Patient Education  2017 [employer].

## 2016-09-15 NOTE — Progress Notes (Signed)
Chaplain visited with patient per consult.  Patient keeps talking about daughter coming. Chaplain would like to visit with daughter and patient together around transition to Hospice or palliative care.  Chaplain will check back this afternoon.  Told by nurse tech that daughter may be around then.    09/15/16 1158  Clinical Encounter Type  Visited With Patient  Visit Type Initial;Psychological support;Spiritual support;Social support

## 2016-09-15 NOTE — Clinical Social Work Placement (Signed)
   CLINICAL SOCIAL WORK PLACEMENT  NOTE  Date:  09/15/2016  Patient Details  Name: Melanie Roberson MRN: [id number] Date of Birth: [birth year]  Clinical Social Work is seeking post-discharge placement for this patient at the Skilled  Nursing Facility level of care (*CSW will initial, date and re-position this form in  chart as items are completed):  Yes   Patient/family provided with [place] Clinical Social Work Department's list of facilities offering this level of care within the geographic area requested by the patient (or if unable, by the patient's family).  Yes   Patient/family informed of their freedom to choose among providers that offer the needed level of care, that participate in Medicare, Medicaid or managed care program needed by the patient, have an available bed and are willing to accept the patient.  Yes   Patient/family informed of [facility]'s ownership interest in [facility] and [facility], as well as of the fact that they are under no obligation to receive care at these facilities.  PASRR submitted to EDS on       PASRR number received on       Existing PASRR number confirmed on 09/15/16     FL2 transmitted to all facilities in geographic area requested by pt/family on       FL2 transmitted to all facilities within larger geographic area on       Patient informed that his/her managed care company has contracts with or will negotiate with certain facilities, including the following:  [facility] ([place])     Yes   Patient/family informed of bed offers received.  [facility]([place])     Physician recommends and patient chooses bed at      [facility] to [facility] on 09/15/16.  Patient to be transferred to facility by [facility]     Patient family notified on 09/15/16 of transfer.  Name of family member notified:  Britta MccreedyBarbara,  granddaughter     PHYSICIAN       Additional Comment:    _______________________________________________ Abigail ButtsSusan Edra Riccardi, LCSW 09/15/2016, 1:58 PM

## 2016-09-15 NOTE — Clinical Social Work Note (Signed)
Clinical Social Work Assessment  Patient Details  Name: Melanie Roberson MRN: [id number] Date of Birth: 1923/06/02  Date of referral:  09/15/16               Reason for consult:  End of Life/Hospice, Facility Placement                Permission sought to share information with:  Facility Medical sales representativeContact Representative, Family Supports Permission granted to share information::  No (patient disoriented)  Name::     Alferd ApaBarbara Gorum  Agency::  [facility]  Relationship::  Dance movement psychotherapistGrandaughter   Contact Information:  [phone]   Housing/Transportation Living arrangements for the past 2 months:  Skilled Nursing Facility Source of Information:  Other (Comment Required) (granddaughter) Patient Interpreter Needed:  None Criminal Activity/Legal Involvement Pertinent to Current Situation/Hospitalization:  No - Comment as needed Significant Relationships:  Other Family Members Lives with:  Facility Resident Do you feel safe going back to the place where you live?  Yes Need for family participation in patient care:  Yes (Comment)  Care giving concerns: [facility] and has been there for approximately 9 years. Patient's granddaughter Britta MccreedyBarbara is point of contact.    Social Worker assessment / plan: CSW spoke with patient's granddaughter via phone several times to coordinate care. Granddaughter and family had meeting with palliative and decided to have patient go back to SNF with hospice following. CSW coordinated discharge back to SNF with hospice.   Employment status:  Retired Health and safety inspectornsurance information:  Armed forces operational officerMedicare, Medicaid In [employer] PT Recommendations:  Skilled Nursing Facility Information / Referral to community resources:  Skilled Nursing Facility, Other (Comment Required) (hospice)  Patient/Family's Response to care: Patient disoriented. Family appreciative of care.  Patient/Family's Understanding of and Emotional Response to Diagnosis, Current Treatment, and Prognosis: Family  understanding of patient's condition and prognosis. Granddaughter talked about wanting to do what is best for patient and was hopeful patient will be made comfortable with hospice following at SNF.  Emotional Assessment Appearance:  Appears stated age Attitude/Demeanor/Rapport:  Unable to Assess (patient disoriented) Affect (typically observed):  Unable to Assess (patient disoriented) Orientation:  Oriented to Self Alcohol / Substance use:  Not Applicable Psych involvement (Current and /or in the community):  No (Comment)  Discharge Needs  Concerns to be addressed:  Discharge Planning Concerns Readmission within the last 30 days:  No Current discharge risk:  Physical Impairment, Terminally ill Barriers to Discharge:  No Barriers Identified   Abigail ButtsSusan Rilan Eiland, LCSW 09/15/2016, 1:46 PM

## 2016-09-15 NOTE — Discharge Summary (Signed)
[facility]  Physician Discharge Summary   Patient ID: Melanie Roberson MRN: [id number] DOB/AGE: 08-19-1923 83 y.o.  Admit date: 09/13/2016 Discharge date: 09/15/2016  PCP: Followed by providers at SNF  DISCHARGE DIAGNOSES:  Active Problems:   Schizophrenia, unspecified type (HCC)   Vascular dementia with behavior disturbance   Hypothyroidism   B12 deficiency anemia   Weight loss   Failure to thrive in adult   Elevated troponin   Hypoxia   DNR (do not resuscitate)   Palliative care by specialist   Generalized weakness   RECOMMENDATIONS FOR OUTPATIENT FOLLOW UP: 1. Hospice to follow at skilled nursing facility 2. Comfort care is the mainstay of treatment   DISCHARGE CONDITION: poor  Diet recommendation: Comfort feeds as tolerated/dysphagia 1  Filed Weights   09/13/16 2020 09/14/16 0535  Weight: 65.1 kg (143 lb 8 oz) 66 kg (145 lb 8 oz)    INITIAL HISTORY: 83 year old Caucasian female with a past medical history of vascular dementia, schizophrenia, unspecified congestive heart failure, hypothyroidism, depression, who lives in a skilled nursing facility and has been losing weight for the past many months. She was sent over to the emergency department due to lethargy and hypoxia. She was found to have elevated d-dimer, mildly elevated troponin. She was hospitalized for further management.  Consultations:  Palliative Medicine   HOSPITAL COURSE:   Acute congestive heart failure, unspecified/elevated troponin/hypoxia Patient has a known history of CHF, although, unspecified if systolic or diastolic. No old echocardiogram reports are available. One has been ordered and remains pending. CT scan of the chest was done. No PE was noted. Groundglass nodular densities were noted. Her BNP is noted to be elevated. There could be an element of fluid overload. Patient was given Lasix. Troponins are trending down. Patient is not a candidate for further ischemic workup due to  her dementia. Seen by palliative medicine. She is hospice appropriate. Continue her diuretics as they may prevent fluid overload, which may help with her respiratory status.  Possible aspiration. Patient does have advanced dementia. Quite likely that she is aspirating. No clearcut infiltrate noted. Seen by speech therapy. She has been placed on dysphagia 1 diet. All of this is a sequelae of her dementia.  Lethargy/advanced dementia/schizophrenia/depression. Based on notes from the nursing facility it appears the patient has been declining over the last few months. I discussed these issues with Britta Mccreedy, her granddaughter. She was amenable to having a discussion with palliative medicine for goals of care. Patient seen by palliative medicine. They are agreeable to hospice care/comfort care. She can go back to her skilled nursing facility with hospice.  Mild acute renal failure. She likely has chronic kidney disease, unspecified. No need to check labs any further. Patient is mainly comfort care.  Positive urine culture with Escherichia coli UA did not suggest infection. Cultures were positive for Escherichia coli. Patient has multiple allergies. She was given fosfomycin. This could be repeated at the skilled nursing facility for 2 more doses.  Normocytic anemia, likely anemia of chronic disease. Abdomen slightly lower, but stable. No overt bleeding.  Hypernatremia Likely due to free water deficit from poor oral intake. Patient needs to be encouraged to take orally.   History of hypothyroidism. Continue with Synthroid if tolerated.  Skin abrasions and ulcer in the left lower extremity. Wound care nurse consulted.  Dysphagia. Continue current diet. Seen by speech therapy.  Goals of care. Palliative Medicine has seen the patient and discussed with family. Life expectancy less than 6 months,  likely a few weeks. Hospice appropriate. Back to skilled nursing facility with hospice  services.  Overall, stable. Patient will go back to skilled nursing facility with hospice.  She's now DO NOT RESUSCITATE as discussed by palliative medicine with patient's family.   PERTINENT LABS:  The results of significant diagnostics from this hospitalization (including imaging, microbiology, ancillary and laboratory) are listed below for reference.    Microbiology: Recent Results (from the past 240 hour(s))  Urine Culture     Status: Abnormal   Collection Time: 09/13/16  2:21 PM  Result Value Ref Range Status   Specimen Description URINE, CATHETERIZED  Final   Special Requests NONE  Final   Culture >=100,000 COLONIES/mL ESCHERICHIA COLI (A)  Final   Report Status 09/15/2016 FINAL  Final   Organism ID, Bacteria ESCHERICHIA COLI (A)  Final      Susceptibility   Escherichia coli - MIC*    AMPICILLIN 4 SENSITIVE Sensitive     CEFAZOLIN <=4 SENSITIVE Sensitive     CEFTRIAXONE <=1 SENSITIVE Sensitive     CIPROFLOXACIN >=4 RESISTANT Resistant     GENTAMICIN <=1 SENSITIVE Sensitive     IMIPENEM <=0.25 SENSITIVE Sensitive     NITROFURANTOIN 32 SENSITIVE Sensitive     TRIMETH/SULFA <=20 SENSITIVE Sensitive     AMPICILLIN/SULBACTAM <=2 SENSITIVE Sensitive     PIP/TAZO <=4 SENSITIVE Sensitive     Extended ESBL NEGATIVE Sensitive     * >=100,000 COLONIES/mL ESCHERICHIA COLI  MRSA PCR Screening     Status: Abnormal   Collection Time: 09/14/16 12:17 AM  Result Value Ref Range Status   MRSA by PCR POSITIVE (A) NEGATIVE Final    Comment:        The GeneXpert MRSA Assay (FDA approved for NASAL specimens only), is one component of a comprehensive MRSA colonization surveillance program. It is not intended to diagnose MRSA infection nor to guide or monitor treatment for MRSA infections. RESULT CALLED TO, READ BACK BY AND VERIFIED WITH: RODGERS,R RN [phone] AT 0355 SKEEN,P      Labs: Basic Metabolic Panel:  Recent Labs Lab 09/13/16 1332 09/14/16 0536 09/15/16 0329  NA  149* 151* 152*  K 4.3 4.2 3.9  CL 112* 115* 118*  CO2 27 27 26   GLUCOSE 96 117* 101*  BUN 44* 43* 45*  CREATININE 1.33* 1.27* 1.23*  CALCIUM 9.0 8.9 9.2   Liver Function Tests:  Recent Labs Lab 09/13/16 1332 09/14/16 0536  AST 29 27  ALT 20 21  ALKPHOS 100 93  BILITOT 0.6 0.8  PROT 6.1* 6.1*  ALBUMIN 2.8* 3.0*   CBC:  Recent Labs Lab 09/13/16 1332 09/14/16 0536 09/15/16 0329  WBC 4.1 6.0 6.7  NEUTROABS 3.1  --   --   HGB 8.1* 7.8* 7.9*  HCT 27.3* 27.3* 26.7*  MCV 92.2 93.5 92.4  PLT 193 210 178   Cardiac Enzymes:  Recent Labs Lab 09/13/16 2310 09/14/16 0536 09/14/16 1022  TROPONINI 0.41* 0.32* 0.37*   BNP: BNP (last 3 results)  Recent Labs  09/13/16 2310  BNP 1,062.5*    IMAGING STUDIES Ct Angio Chest Pe W And/or Wo Contrast  Result Date: 09/13/2016 CLINICAL DATA:  83 year old female with fatigue and elevated D-dimer. Rule out PE. EXAM: CT ANGIOGRAPHY CHEST WITH CONTRAST TECHNIQUE: Multidetector CT imaging of the chest was performed using the standard protocol during bolus administration of intravenous contrast. Multiplanar CT image reconstructions and MIPs were obtained to evaluate the vascular anatomy. CONTRAST:  100 cc Isovue 370. COMPARISON:  Chest radiograph 09/13/2016 FINDINGS: Cardiovascular: Top-normal cardiac size. No pericardial effusion. There is coronary vascular calcification involving the left main, lad, left circumflex artery, and RCA. There is advanced atherosclerotic calcification of the thoracic aorta. No aneurysmal dilatation or evidence of dissection. There is atherosclerotic calcification of the origins of the great vessels of the aortic arch. The visualized portions of the vessels of the aortic arch appear patent. There is no CT evidence of pulmonary embolism. Mediastinum/Nodes: There is no hilar or mediastinal adenopathy. There is a small hiatal hernia. The esophagus is grossly unremarkable. Lungs/Pleura: There moderate size bilateral  pleural effusions. There is associated compressive atelectasis of the right lower lobes. Pneumonia is not excluded. There is consolidative changes of the posterior left upper lobe and lingula along the fissure. Small ground-glass subpleural nodular density in the lingula noted which may represent atelectatic changes versus infiltrate. Clinical correlation is recommended. There is diffuse peribronchial thickening. There is a 13 x 9 mm peribronchovascular nodular density in the left upper lobe (series 9, image 52) which may the inflammatory/infectious in etiology. However, developing neoplasm is not excluded. Ground-glass nodular density in the right lower lobe along the fissure (series 9 image 89) likely inflammatory/infectious. There is no pneumothorax. The central airways are patent. Upper Abdomen: There is morphologic changes of cirrhosis. The spleen is only partially visualized but appears mildly enlarged. Musculoskeletal: Osteopenia with degenerative changes of the spine. No acute fracture. Review of the MIP images confirms the above findings. IMPRESSION: 1. No CT evidence of pulmonary artery embolism. 2. Moderate-sized bilateral pleural effusions with bilateral lower lobe partial compressive atelectasis versus infiltrate. Subsegmental consolidative changes in the posterior upper lobe along the fissure defect versus infiltrate. 3. Scattered ground-glass nodular densities, likely inflammatory/infectious. Diffuse peribronchial thickening with a focal nodular density in the right upper lobe which also likely inflammatory/infectious in etiology. Developing neoplasm is less likely but not excluded. Clinical correlation and follow-up recommended. 4. Coronary vascular calcification and advanced atherosclerotic disease of the thoracic aorta. 5. Cirrhosis all 6.  Aortic Atherosclerosis (ICD10-I70.0). Electronically Signed   By: Elgie Collard M.D.   On: 09/13/2016 21:50   Dg Chest Port 1 View  Result Date:  09/13/2016 CLINICAL DATA:  Lethargy. EXAM: PORTABLE CHEST 1 VIEW COMPARISON:  05/02/2016. FINDINGS: Interval borderline enlarged cardiac silhouette with increased prominence of the pulmonary vasculature and interstitial markings and small bilateral pleural effusions. The aorta is calcified. Diffuse osteopenia. IMPRESSION: Interval changes of acute congestive heart failure with borderline cardiomegaly. Electronically Signed   By: Beckie Salts M.D.   On: 09/13/2016 15:31    DISCHARGE EXAMINATION: Vitals:   09/14/16 2144 09/14/16 2145 09/15/16 0841 09/15/16 1129  BP: 133/63 133/63 (!) 155/53 (!) 141/47  Pulse:      Resp: 14     Temp: 97.7 F (36.5 C)  (!) 97.3 F (36.3 C)   TempSrc: Axillary  Axillary   SpO2:      Weight:       General appearance: Asleep when I examined. Resp: clear to auscultation bilaterally Cardio: regular rate and rhythm, S1, S2 normal, no murmur, click, rub or gallop  DISPOSITION: Back to skilled nursing facility with hospice  Discharge Instructions    Discharge instructions    Complete by:  As directed    Patient will need to be followed by hospice at SNF. Life expectancy is less than 6 months. See instructions on the discharge summary.  You were cared for by a hospitalist during your hospital stay. If you have any  questions about your discharge medications or the care you received while you were in the hospital after you are discharged, you can call the unit and asked to speak with the hospitalist on call if the hospitalist that took care of you is not available. Once you are discharged, your primary care physician will handle any further medical issues. Please note that NO REFILLS for any discharge medications will be authorized once you are discharged, as it is imperative that you return to your primary care physician (or establish a relationship with a primary care physician if you do not have one) for your aftercare needs so that they can reassess your need for  medications and monitor your lab values. If you do not have a primary care physician, you can call [phone] for a physician referral.      ALLERGIES:  Allergies  Allergen Reactions  . Erythromycin     Per MAR  . Penicillins     Per MAR  . Sulfa Antibiotics     Per [facility]     Current Discharge Medication List    START taking these medications   Details  fosfomycin (MONUROL) 3 g PACK Take 3 g by mouth every other day. For 2 more doses. Qty: 1 g    Morphine Sulfate (MORPHINE CONCENTRATE) 10 MG/0.5ML SOLN concentrated solution Take 0.25 mLs (5 mg total) by mouth every 4 (four) hours as needed for moderate pain or shortness of breath. Qty: 15 mL, Refills: 0      CONTINUE these medications which have NOT CHANGED   Details  acetaminophen (TYLENOL) 500 MG tablet Take 500 mg by mouth every 6 (six) hours as needed.    DULoxetine (CYMBALTA) 30 MG capsule Take 30 mg by mouth daily. For anxiety    guaiFENesin (ROBITUSSIN) 100 MG/5ML liquid Take 200 mg by mouth every 6 (six) hours as needed for cough. Reported on 06/12/2015    lactulose (CHRONULAC) 10 GM/15ML solution Take 30 g by mouth every morning.     levothyroxine (SYNTHROID, LEVOTHROID) 125 MCG tablet Take 125 mcg by mouth daily before breakfast.    magnesium hydroxide (MILK OF MAGNESIA) 400 MG/5ML suspension Take 30 mLs by mouth daily as needed for mild constipation.    Multiple Vitamins-Minerals (MULTIVITAMIN WITH MINERALS) tablet Take 1 tablet by mouth daily.    nicotine (NICODERM CQ - DOSED IN MG/24 HR) 7 mg/24hr patch Place 7 mg onto the skin daily.    OLANZapine (ZYPREXA) 5 MG tablet Take 5 mg by mouth at bedtime.     polyethylene glycol powder (MIRALAX) powder Take 17 g by mouth daily. For constipation. Dispense 1 month supply.    Polyvinyl Alcohol-Povidone (FRESHKOTE) 2.7-2 % SOLN Instill 1 drop in both eyes three times a day    potassium chloride SA (KLOR-CON M20) 20 MEQ tablet Take 20 mEq by mouth 2 (two) times daily.     Skin Protectants, Misc. (CALAZIME SKIN PROTECTANT EX) Apply to bilateral buttocks and coccyx area 2 times daily and as needed    torsemide (DEMADEX) 20 MG tablet Take 20 mg by mouth 2 (two) times daily.    UNABLE TO FIND Med Pass 120 cc by mouth 3 times daily      STOP taking these medications     cetirizine (ZYRTEC) 5 MG tablet      cyanocobalamin 500 MCG tablet         TOTAL DISCHARGE TIME: 35 mins  [facility]  [facility] Pager [phone]  09/15/2016, 1:21 PM

## 2016-09-15 NOTE — [place] FL2 (Signed)
[place] MEDICAID FL2 LEVEL OF CARE SCREENING TOOL     IDENTIFICATION  Patient Name: Melanie Roberson Birthdate: [birth year] Sex: female Admission Date (Current Location): 09/13/2016  [facility] and [place] Number:  Producer, television/film/video and Address:  [facility] [place], [address] [place], [place] [address]      Provider Number: [id number]  Attending Physician Name and Address:  Osvaldo Shipper, MD  Relative Name and Phone Number:  Threasa, Kinch, [phone]     Current Level of Care: Hospital Recommended Level of Care: Skilled Nursing Facility Prior Approval Number:    Date Approved/Denied:   PASRR Number: [id number]  Discharge Plan: SNF    Current Diagnoses: Patient Active Problem List   Diagnosis Date Noted  . DNR (do not resuscitate) 09/14/2016  . Palliative care by specialist 09/14/2016  . Generalized weakness   . Elevated troponin 09/13/2016  . Hypoxia 09/13/2016  . Benign hypertensive heart disease without heart failure 11/11/2015  . Weight loss 05/06/2015  . Failure to thrive in adult 05/06/2015  . Hypokalemia 12/01/2013  . B12 deficiency anemia 12/01/2013  . Vascular dementia with behavior disturbance 08/11/2013  . Hypothyroidism 08/11/2013  . Depression due to dementia 06/11/2013  . Congestive heart failure (HCC) 05/19/2012  . Constipation 05/19/2012  . Schizophrenia, unspecified type (HCC) 04/29/2006    Orientation RESPIRATION BLADDER Height & Weight     Self  O2 (nasal cannula 2L) Incontinent Weight: 145 lb 8 oz (66 kg) Height:     BEHAVIORAL SYMPTOMS/MOOD NEUROLOGICAL BOWEL NUTRITION STATUS      Incontinent Diet (Dysphagia 1; please see DC summary)  AMBULATORY STATUS COMMUNICATION OF NEEDS Skin   Extensive Assist Verbally Skin abrasions (scratches bilateral arms)                       Personal Care Assistance Level of Assistance  Bathing, Feeding, Dressing Bathing Assistance: Maximum  assistance Feeding assistance: Maximum assistance Dressing Assistance: Maximum assistance     Functional Limitations Info  Sight, Hearing, Speech Sight Info: Adequate Hearing Info: Adequate Speech Info: Adequate    SPECIAL CARE FACTORS FREQUENCY                       Contractures      Additional Factors Info  Code Status, Allergies Code Status Info: DNR Allergies Info: Erythromycin, Penicillins, Sulfa Antibiotics Psychotropic Info: cymbalta, zyprexa         Current Medications (09/15/2016):  This is the current hospital active medication list Current Facility-Administered Medications  Medication Dose Route Frequency Provider Last Rate Last Dose  . 0.9 %  sodium chloride infusion  250 mL Intravenous PRN Osvaldo Shipper, MD      . acetaminophen (TYLENOL) tablet 650 mg  650 mg Oral Q6H PRN Osvaldo Shipper, MD       Or  . acetaminophen (TYLENOL) suppository 650 mg  650 mg Rectal Q6H PRN Osvaldo Shipper, MD      . albuterol (PROVENTIL) (2.5 MG/3ML) 0.083% nebulizer solution 2.5 mg  2.5 mg Nebulization Q2H PRN Osvaldo Shipper, MD   2.5 mg at 09/14/16 0221  . [START ON 09/16/2016] Chlorhexidine Gluconate Cloth 2 % PADS 6 each  6 each Topical Q0600 Osvaldo Shipper, MD      . DULoxetine (CYMBALTA) DR capsule 30 mg  30 mg Oral Daily Osvaldo Shipper, MD   30 mg at 09/15/16 0900  . food thickener (SIMPLYTHICK) packet 2 packet  2 packet  Oral PRN Osvaldo ShipperKrishnan, Gokul, MD      . fosfomycin (MONUROL) packet 3 g  3 g Oral Q48H Silvana NewnessMeyer, Andrew D, [facility] CenterRPH      . hydrALAZINE (APRESOLINE) injection 10 mg  10 mg Intravenous Q6H PRN Osvaldo ShipperKrishnan, Gokul, MD   10 mg at 09/13/16 2347  . levothyroxine (SYNTHROID, LEVOTHROID) tablet 125 mcg  125 mcg Oral QAC breakfast Osvaldo ShipperKrishnan, Gokul, MD   125 mcg at 09/15/16 0755  . morphine CONCENTRATE 10 MG/0.5ML oral solution 5 mg  5 mg Oral Q4H PRN Canary BrimLarach, Mary W, NP      . mupirocin ointment (BACTROBAN) 2 % 1 application  1 application Nasal BID Osvaldo ShipperKrishnan, Gokul, MD      .  OLANZapine [facility](ZYPREXA) tablet 5 mg  5 mg Oral QHS Osvaldo ShipperKrishnan, Gokul, MD   5 mg at 09/14/16 2225  . ondansetron (ZOFRAN) tablet 4 mg  4 mg Oral Q6H PRN Osvaldo ShipperKrishnan, Gokul, MD       Or  . ondansetron [facility](ZOFRAN) injection 4 mg  4 mg Intravenous Q6H PRN Osvaldo ShipperKrishnan, Gokul, MD      . RESOURCE THICKENUP CLEAR   Oral PRN Osvaldo ShipperKrishnan, Gokul, MD      . sodium chloride flush (NS) 0.9 % injection 3 mL  3 mL Intravenous Q12H Osvaldo ShipperKrishnan, Gokul, MD   3 mL at 09/14/16 2226  . sodium chloride flush (NS) 0.9 % injection 3 mL  3 mL Intravenous Q12H Osvaldo ShipperKrishnan, Gokul, MD   3 mL at 09/14/16 2225  . sodium chloride flush (NS) 0.9 % injection 3 mL  3 mL Intravenous PRN Osvaldo ShipperKrishnan, Gokul, MD         Discharge Medications: Please see discharge summary for a list of discharge medications.  Relevant Imaging Results:  Relevant Lab Results:   Additional Information SSN: [id number]  Abigail ButtsSusan Anaid Haney, LCSW

## 2016-09-15 NOTE — Progress Notes (Signed)
[facility] Anticipated discharge date: 09/15/16 Family notified: Alferd ApaBarbara, [place], granddaughter Transportation by: Sharin MonsPTAR   CSW signing off.  Abigail ButtsSusan Aishi Courts, LCSWA  Clinical Social Worker

## 2016-09-15 NOTE — Progress Notes (Signed)
Nutrition Brief Note  Chart reviewed due to MST score = 2. Per review of usual weights, no weight loss recently. Palliative Care team is following and decision has been made for comfort care.  No nutrition interventions warranted at this time.  Please consult as needed.   Joaquin CourtsKimberly Mieshia Pepitone, RD, LDN, CNSC Pager [phone] After Hours Pager [phone]

## 2016-09-15 NOTE — Plan of Care (Signed)
Problem: Physical Regulation: Goal: Ability to maintain clinical measurements within normal limits will improve Outcome: Adequate for Discharge Patient awaiting discharge to hospice care.

## 2016-09-30 DEATH — deceased
